# Patient Record
Sex: Male | Born: 1960 | Race: Black or African American | Hispanic: No | Marital: Married | Smoking: Current every day smoker
Health system: Southern US, Community
[De-identification: ages and names within clinical notes are randomized; demographics above are authoritative.]

## PROBLEM LIST (undated history)

## (undated) DIAGNOSIS — F1010 Alcohol abuse, uncomplicated: Secondary | ICD-10-CM

## (undated) DIAGNOSIS — I509 Heart failure, unspecified: Secondary | ICD-10-CM

## (undated) DIAGNOSIS — E781 Pure hyperglyceridemia: Secondary | ICD-10-CM

## (undated) DIAGNOSIS — I429 Cardiomyopathy, unspecified: Secondary | ICD-10-CM

## (undated) DIAGNOSIS — N529 Male erectile dysfunction, unspecified: Secondary | ICD-10-CM

## (undated) DIAGNOSIS — M1990 Unspecified osteoarthritis, unspecified site: Secondary | ICD-10-CM

## (undated) DIAGNOSIS — K635 Polyp of colon: Secondary | ICD-10-CM

## (undated) DIAGNOSIS — I10 Essential (primary) hypertension: Secondary | ICD-10-CM

## (undated) DIAGNOSIS — R933 Abnormal findings on diagnostic imaging of other parts of digestive tract: Secondary | ICD-10-CM

## (undated) HISTORY — PX: COLONOSCOPY: SHX174

---

## 2004-05-19 ENCOUNTER — Emergency Department: Payer: Self-pay | Admitting: Emergency Medicine

## 2004-05-19 ENCOUNTER — Other Ambulatory Visit: Payer: Self-pay

## 2004-05-24 ENCOUNTER — Ambulatory Visit: Payer: Self-pay | Admitting: Internal Medicine

## 2006-03-06 ENCOUNTER — Emergency Department: Payer: Self-pay | Admitting: Emergency Medicine

## 2008-03-15 ENCOUNTER — Emergency Department: Payer: Self-pay | Admitting: Unknown Physician Specialty

## 2008-03-22 ENCOUNTER — Ambulatory Visit: Payer: Self-pay | Admitting: Family Medicine

## 2009-07-13 ENCOUNTER — Ambulatory Visit: Payer: Self-pay | Admitting: Unknown Physician Specialty

## 2013-12-08 DIAGNOSIS — I429 Cardiomyopathy, unspecified: Secondary | ICD-10-CM | POA: Insufficient documentation

## 2013-12-08 DIAGNOSIS — I10 Essential (primary) hypertension: Secondary | ICD-10-CM | POA: Insufficient documentation

## 2013-12-08 DIAGNOSIS — N529 Male erectile dysfunction, unspecified: Secondary | ICD-10-CM | POA: Insufficient documentation

## 2013-12-08 DIAGNOSIS — E781 Pure hyperglyceridemia: Secondary | ICD-10-CM | POA: Insufficient documentation

## 2017-07-24 DIAGNOSIS — M1712 Unilateral primary osteoarthritis, left knee: Secondary | ICD-10-CM | POA: Insufficient documentation

## 2017-10-24 ENCOUNTER — Emergency Department: Payer: Managed Care, Other (non HMO)

## 2017-10-24 ENCOUNTER — Encounter: Payer: Self-pay | Admitting: Emergency Medicine

## 2017-10-24 ENCOUNTER — Other Ambulatory Visit: Payer: Self-pay

## 2017-10-24 ENCOUNTER — Observation Stay
Admission: EM | Admit: 2017-10-24 | Discharge: 2017-10-25 | Disposition: A | Discharge status: Home / Self Care | Payer: Managed Care, Other (non HMO) | Attending: Internal Medicine | Admitting: Internal Medicine

## 2017-10-24 ENCOUNTER — Emergency Department
Admission: EM | Admit: 2017-10-24 | Discharge: 2017-10-24 | Disposition: A | Discharge status: Home / Self Care | Payer: Managed Care, Other (non HMO) | Source: Home / Self Care | Attending: Emergency Medicine | Admitting: Emergency Medicine

## 2017-10-24 DIAGNOSIS — F17200 Nicotine dependence, unspecified, uncomplicated: Secondary | ICD-10-CM

## 2017-10-24 DIAGNOSIS — E785 Hyperlipidemia, unspecified: Secondary | ICD-10-CM | POA: Insufficient documentation

## 2017-10-24 DIAGNOSIS — N179 Acute kidney failure, unspecified: Secondary | ICD-10-CM | POA: Diagnosis not present

## 2017-10-24 DIAGNOSIS — R197 Diarrhea, unspecified: Secondary | ICD-10-CM | POA: Insufficient documentation

## 2017-10-24 DIAGNOSIS — F1020 Alcohol dependence, uncomplicated: Secondary | ICD-10-CM | POA: Insufficient documentation

## 2017-10-24 DIAGNOSIS — K852 Alcohol induced acute pancreatitis without necrosis or infection: Secondary | ICD-10-CM | POA: Insufficient documentation

## 2017-10-24 DIAGNOSIS — Z79899 Other long term (current) drug therapy: Secondary | ICD-10-CM | POA: Insufficient documentation

## 2017-10-24 DIAGNOSIS — F17210 Nicotine dependence, cigarettes, uncomplicated: Secondary | ICD-10-CM | POA: Insufficient documentation

## 2017-10-24 DIAGNOSIS — Z7982 Long term (current) use of aspirin: Secondary | ICD-10-CM | POA: Insufficient documentation

## 2017-10-24 DIAGNOSIS — F1010 Alcohol abuse, uncomplicated: Secondary | ICD-10-CM | POA: Diagnosis not present

## 2017-10-24 DIAGNOSIS — R531 Weakness: Secondary | ICD-10-CM

## 2017-10-24 DIAGNOSIS — I110 Hypertensive heart disease with heart failure: Secondary | ICD-10-CM

## 2017-10-24 DIAGNOSIS — E871 Hypo-osmolality and hyponatremia: Principal | ICD-10-CM | POA: Diagnosis present

## 2017-10-24 DIAGNOSIS — I509 Heart failure, unspecified: Secondary | ICD-10-CM | POA: Insufficient documentation

## 2017-10-24 DIAGNOSIS — R748 Abnormal levels of other serum enzymes: Secondary | ICD-10-CM | POA: Diagnosis not present

## 2017-10-24 DIAGNOSIS — E876 Hypokalemia: Secondary | ICD-10-CM | POA: Diagnosis not present

## 2017-10-24 DIAGNOSIS — E860 Dehydration: Secondary | ICD-10-CM | POA: Insufficient documentation

## 2017-10-24 HISTORY — DX: Essential (primary) hypertension: I10

## 2017-10-24 HISTORY — DX: Heart failure, unspecified: I50.9

## 2017-10-24 HISTORY — DX: Alcohol abuse, uncomplicated: F10.10

## 2017-10-24 LAB — URINALYSIS, COMPLETE (UACMP) WITH MICROSCOPIC
BACTERIA UA: NONE SEEN
BILIRUBIN URINE: NEGATIVE
Glucose, UA: NEGATIVE mg/dL
HGB URINE DIPSTICK: NEGATIVE
Ketones, ur: NEGATIVE mg/dL
LEUKOCYTES UA: NEGATIVE
NITRITE: NEGATIVE
PROTEIN: NEGATIVE mg/dL
Specific Gravity, Urine: 1.008 (ref 1.005–1.030)
Squamous Epithelial / LPF: NONE SEEN (ref 0–5)
pH: 6 (ref 5.0–8.0)

## 2017-10-24 LAB — COMPREHENSIVE METABOLIC PANEL
ALK PHOS: 34 U/L — AB (ref 38–126)
ALK PHOS: 33 U/L — AB (ref 38–126)
ALT: 19 U/L (ref 0–44)
ALT: 18 U/L (ref 0–44)
ANION GAP: 11 (ref 5–15)
ANION GAP: 10 (ref 5–15)
AST: 54 U/L — ABNORMAL HIGH (ref 15–41)
AST: 45 U/L — ABNORMAL HIGH (ref 15–41)
Albumin: 3.7 g/dL (ref 3.5–5.0)
Albumin: 3.7 g/dL (ref 3.5–5.0)
BILIRUBIN TOTAL: 1.2 mg/dL (ref 0.3–1.2)
BUN: 20 mg/dL (ref 6–20)
BUN: 19 mg/dL (ref 6–20)
CALCIUM: 8.7 mg/dL — AB (ref 8.9–10.3)
CALCIUM: 8.4 mg/dL — AB (ref 8.9–10.3)
CHLORIDE: 89 mmol/L — AB (ref 98–111)
CO2: 25 mmol/L (ref 22–32)
CO2: 24 mmol/L (ref 22–32)
CREATININE: 1.6 mg/dL — AB (ref 0.61–1.24)
Chloride: 93 mmol/L — ABNORMAL LOW (ref 98–111)
Creatinine, Ser: 1.8 mg/dL — ABNORMAL HIGH (ref 0.61–1.24)
GFR calc Af Amer: 46 mL/min — ABNORMAL LOW (ref >60)
GFR calc non Af Amer: 40 mL/min — ABNORMAL LOW (ref >60)
GFR calc non Af Amer: 46 mL/min — ABNORMAL LOW (ref >60)
GFR, EST AFRICAN AMERICAN: 53 mL/min — AB (ref >60)
Glucose, Bld: 116 mg/dL — ABNORMAL HIGH (ref 70–99)
Glucose, Bld: 130 mg/dL — ABNORMAL HIGH (ref 70–99)
Potassium: 4.3 mmol/L (ref 3.5–5.1)
Potassium: 3.4 mmol/L — ABNORMAL LOW (ref 3.5–5.1)
SODIUM: 125 mmol/L — AB (ref 135–145)
Sodium: 127 mmol/L — ABNORMAL LOW (ref 135–145)
TOTAL PROTEIN: 6.8 g/dL (ref 6.5–8.1)
Total Bilirubin: 1.4 mg/dL — ABNORMAL HIGH (ref 0.3–1.2)
Total Protein: 6.8 g/dL (ref 6.5–8.1)

## 2017-10-24 LAB — CBC WITH DIFFERENTIAL/PLATELET
BASOS PCT: 3 %
Basophils Absolute: 0.3 10*3/uL — ABNORMAL HIGH (ref 0–0.1)
Eosinophils Absolute: 0.1 10*3/uL (ref 0–0.7)
Eosinophils Relative: 1 %
HCT: 33.9 % — ABNORMAL LOW (ref 40.0–52.0)
HEMOGLOBIN: 12.1 g/dL — AB (ref 13.0–18.0)
LYMPHS PCT: 20 %
Lymphs Abs: 2 10*3/uL (ref 1.0–3.6)
MCH: 35.2 pg — ABNORMAL HIGH (ref 26.0–34.0)
MCHC: 35.8 g/dL (ref 32.0–36.0)
MCV: 98.4 fL (ref 80.0–100.0)
MONO ABS: 0.9 10*3/uL (ref 0.2–1.0)
MONOS PCT: 9 %
NEUTROS ABS: 6.7 10*3/uL — AB (ref 1.4–6.5)
NEUTROS PCT: 67 %
Platelets: 370 10*3/uL (ref 150–440)
RBC: 3.45 MIL/uL — AB (ref 4.40–5.90)
RDW: 13.6 % (ref 11.5–14.5)
WBC: 10 10*3/uL (ref 3.8–10.6)

## 2017-10-24 LAB — CBC
HCT: 32.9 % — ABNORMAL LOW (ref 40.0–52.0)
HEMOGLOBIN: 11.7 g/dL — AB (ref 13.0–18.0)
MCH: 35 pg — ABNORMAL HIGH (ref 26.0–34.0)
MCHC: 35.8 g/dL (ref 32.0–36.0)
MCV: 98 fL (ref 80.0–100.0)
PLATELETS: 337 10*3/uL (ref 150–440)
RBC: 3.35 MIL/uL — AB (ref 4.40–5.90)
RDW: 13.1 % (ref 11.5–14.5)
WBC: 11.3 10*3/uL — ABNORMAL HIGH (ref 3.8–10.6)

## 2017-10-24 LAB — PROTIME-INR
INR: 0.98
Prothrombin Time: 12.9 seconds (ref 11.4–15.2)

## 2017-10-24 LAB — ETHANOL

## 2017-10-24 LAB — MAGNESIUM: Magnesium: 2 mg/dL (ref 1.7–2.4)

## 2017-10-24 LAB — LIPASE, BLOOD: Lipase: 68 U/L — ABNORMAL HIGH (ref 11–51)

## 2017-10-24 MED ORDER — ASPIRIN EC 81 MG PO TBEC: 81.0000 mg | DELAYED_RELEASE_TABLET | Freq: Every day | ORAL | Status: DC

## 2017-10-24 MED ORDER — SODIUM CHLORIDE 0.9 % IV SOLN
Freq: Once | INTRAVENOUS | Status: AC
  Administered 2017-10-24: 19:00:00 via INTRAVENOUS

## 2017-10-24 MED ORDER — ONDANSETRON HCL 4 MG/2ML IJ SOLN: 4.0000 mg | Freq: Four times a day (QID) | INTRAMUSCULAR | Status: DC | PRN

## 2017-10-24 MED ORDER — ACETAMINOPHEN 325 MG PO TABS: 650.0000 mg | ORAL_TABLET | Freq: Four times a day (QID) | ORAL | Status: DC | PRN

## 2017-10-24 MED ORDER — POTASSIUM CHLORIDE CRYS ER 20 MEQ PO TBCR
20.0000 meq | EXTENDED_RELEASE_TABLET | Freq: Two times a day (BID) | ORAL | Status: DC
  Administered 2017-10-24: 20 meq via ORAL
  Filled 2017-10-24: qty 1

## 2017-10-24 MED ORDER — POTASSIUM CHLORIDE IN NACL 20-0.9 MEQ/L-% IV SOLN
INTRAVENOUS | Status: DC
  Administered 2017-10-24 – 2017-10-25 (×2): via INTRAVENOUS
  Filled 2017-10-24 (×4): qty 1000

## 2017-10-24 MED ORDER — FENOFIBRATE 160 MG PO TABS
160.0000 mg | ORAL_TABLET | Freq: Every day | ORAL | Status: DC
  Filled 2017-10-24 (×2): qty 1

## 2017-10-24 MED ORDER — SODIUM CHLORIDE 0.9 % IV SOLN
Freq: Once | INTRAVENOUS | Status: AC
  Administered 2017-10-24: 12:00:00 via INTRAVENOUS

## 2017-10-24 MED ORDER — ONDANSETRON 4 MG PO TBDP: 4.0000 mg | ORAL_TABLET | Freq: Three times a day (TID) | ORAL | 0 refills | Status: DC | PRN

## 2017-10-24 MED ORDER — CHLORDIAZEPOXIDE HCL 25 MG PO CAPS: 25.0000 mg | ORAL_CAPSULE | Freq: Four times a day (QID) | ORAL | 0 refills | Status: DC | PRN

## 2017-10-24 MED ORDER — ACETAMINOPHEN 650 MG RE SUPP: 650.0000 mg | Freq: Four times a day (QID) | RECTAL | Status: DC | PRN

## 2017-10-24 MED ORDER — IOPAMIDOL (ISOVUE-300) INJECTION 61%
30.0000 mL | Freq: Once | INTRAVENOUS | Status: AC | PRN
  Administered 2017-10-24: 30 mL via ORAL

## 2017-10-24 MED ORDER — IOHEXOL 300 MG/ML  SOLN
75.0000 mL | Freq: Once | INTRAMUSCULAR | Status: AC | PRN
  Administered 2017-10-24: 75 mL via INTRAVENOUS

## 2017-10-24 MED ORDER — ONDANSETRON HCL 4 MG PO TABS: 4.0000 mg | ORAL_TABLET | Freq: Four times a day (QID) | ORAL | Status: DC | PRN

## 2017-10-24 MED ORDER — ENOXAPARIN SODIUM 40 MG/0.4ML ~~LOC~~ SOLN: 40.0000 mg | SUBCUTANEOUS | Status: DC

## 2017-10-24 NOTE — H&P (Signed)
[facility] [place] NAME: Alexander Welch    MR#:  [id number]  DATE OF BIRTH:  11-07-1960  DATE OF ADMISSION:  10/24/2017  PRIMARY CARE PHYSICIAN: Sofie Hartigan, MD   REQUESTING/REFERRING PHYSICIAN: Dr. Lenise Arena  CHIEF COMPLAINT:   Chief Complaint  Patient presents with  . Weakness    HISTORY OF PRESENT ILLNESS:  Alexander Welch  is a 57 y.o. male with a known history of alcohol abuse, hypertension who presents to the hospital due to weakness, loose stools and poor p.o. intake.  Patient says she has a history of significant alcohol abuse and drinks about a pint of vodka daily.  He presented to the ER earlier today complaining of generalized weakness malaise and was noted to have mild hyponatremia and a mild chemical pancreatitis but no imaging findings suggestive of pancreatitis.  He was given some IV fluids, and discharged home on some antiemetics.  When he got home he felt more weak and started having some loose stools and therefore his wife brought him back to the ER.  On repeat blood work patient continues to be hyponatremic and also now hypokalemic.  Hospitalist services were contacted for admission.  Patient denies any abdominal pain, fever, chills, headache, dysuria, urinary frequency, melena, hematochezia or any other associated symptoms.  PAST MEDICAL HISTORY:   Past Medical History:  Diagnosis Date  . Alcohol abuse   . CHF (congestive heart failure) ([place])   . Hypertension     PAST SURGICAL HISTORY:  History reviewed. No pertinent surgical history.  SOCIAL HISTORY:   Social History   Tobacco Use  . Smoking status: Current Every Day Smoker    Packs/day: 1.00    Years: 40.00    Pack years: 40.00    Types: Cigarettes  . Smokeless tobacco: Never Used  Substance Use Topics  . Alcohol use: Yes    Comment: 1 pint of vodka daily    FAMILY HISTORY:   Family History  Problem Relation Age of Onset  . Heart  failure Mother   . Renal Disease Mother   . Bone cancer Father     DRUG ALLERGIES:  No Known Allergies  REVIEW OF SYSTEMS:   Review of Systems  Constitutional: Negative for fever and weight loss.  HENT: Negative for congestion, nosebleeds and tinnitus.   Eyes: Negative for blurred vision, double vision and redness.  Respiratory: Negative for cough, hemoptysis and shortness of breath.   Cardiovascular: Negative for chest pain, orthopnea, leg swelling and PND.  Gastrointestinal: Positive for diarrhea and nausea. Negative for abdominal pain, melena and vomiting.  Genitourinary: Negative for dysuria, hematuria and urgency.  Musculoskeletal: Negative for falls and joint pain.  Neurological: Positive for weakness. Negative for dizziness, tingling, sensory change, focal weakness, seizures and headaches.  Endo/Heme/Allergies: Negative for polydipsia. Does not bruise/bleed easily.  Psychiatric/Behavioral: Negative for depression and memory loss. The patient is not nervous/anxious.     MEDICATIONS AT HOME:   Prior to Admission medications   Medication Sig Start Date End Date Taking? Authorizing Provider  aspirin EC 81 MG tablet Take 1 tablet by mouth daily.   Yes [provider]  fenofibrate 160 MG tablet Take 1 tablet by mouth daily. 01/09/17  Yes [provider]  furosemide (LASIX) 40 MG tablet Take 1 tablet by mouth daily. 01/09/17  Yes [provider]  lisinopril-hydrochlorothiazide (PRINZIDE,ZESTORETIC) 20-12.5 MG tablet Take 1 tablet by mouth daily. 01/09/17  Yes [provider]  sildenafil (  REVATIO) 20 MG tablet Take 40-60 mg by mouth daily as needed. 01/09/17 01/09/18 Yes [provider]  chlordiazePOXIDE (LIBRIUM) 25 MG capsule Take 1 capsule (25 mg total) by mouth 4 (four) times daily as needed. Patient not taking: Reported on 10/24/2017 10/24/17   Earleen Newport, MD  ondansetron (ZOFRAN ODT) 4 MG disintegrating tablet Take 1 tablet (4 mg  total) by mouth every 8 (eight) hours as needed for nausea or vomiting. 10/24/17   Earleen Newport, MD      VITAL SIGNS:  Blood pressure 111/75, pulse 90, temperature 99.7 F (37.6 C), temperature source Oral, resp. rate 18, height 5\' 7"  (1.702 m), weight 68 kg (150 lb), SpO2 100 %.  PHYSICAL EXAMINATION:  Physical Exam  GENERAL:  57 y.o.-year-old patient lying in the bed in no acute distress.  EYES: Pupils equal, round, reactive to light and accommodation. No scleral icterus. Extraocular muscles intact.  HEENT: Head atraumatic, normocephalic. Oropharynx and nasopharynx clear. No oropharyngeal erythema, moist oral mucosa  NECK:  Supple, no jugular venous distention. No thyroid enlargement, no tenderness.  LUNGS: Normal breath sounds bilaterally, no wheezing, rales, rhonchi. No use of accessory muscles of respiration.  CARDIOVASCULAR: S1, S2 RRR. No murmurs, rubs, gallops, clicks.  ABDOMEN: Soft, nontender, nondistended. Bowel sounds present. No organomegaly or mass.  EXTREMITIES: No pedal edema, cyanosis, or clubbing. + 2 pedal & radial pulses b/l.   NEUROLOGIC: Cranial nerves II through XII are intact. No focal Motor or sensory deficits appreciated b/l PSYCHIATRIC: The patient is alert and oriented x 3.  SKIN: No obvious rash, lesion, or ulcer.   LABORATORY PANEL:   CBC Recent Labs  Lab 10/24/17 1741  WBC 11.3*  HGB 11.7*  HCT 32.9*  PLT 337   ------------------------------------------------------------------------------------------------------------------  Chemistries  Recent Labs  Lab 10/24/17 1741  NA 127*  K 3.4*  CL 93*  CO2 24  GLUCOSE 130*  BUN 19  CREATININE 1.60*  CALCIUM 8.4*  AST 45*  ALT 18  ALKPHOS 33*  BILITOT 1.2   ------------------------------------------------------------------------------------------------------------------  Cardiac Enzymes No results for input(s): TROPONINI in the last 168  hours. ------------------------------------------------------------------------------------------------------------------  RADIOLOGY:  Dg Chest 2 View  Result Date: 10/24/2017 CLINICAL DATA:  Smoker.  Early satiety.  Weakness. EXAM: CHEST - 2 VIEW COMPARISON:  03/06/2006. FINDINGS: Normal sized heart. Clear lungs. Small nodular density overlying scratch the small nodular density at the left lung base. Mild diffuse peribronchial thickening. Mild thoracic spine degenerative changes. IMPRESSION: 1. **An incidental finding of potential clinical significance has been found. There is a small nodular density at the left lung base on the frontal view. Further evaluation with an elective chest CT with contrast is recommended.** 2. Mild chronic bronchitic changes. Electronically Signed   By: Claudie Revering M.D.   On: 10/24/2017 13:04   Ct Abdomen Pelvis W Contrast  Result Date: 10/24/2017 CLINICAL DATA:  57 year old male with decreased p.o. intake for the past 2 weeks due to early satiety. Subsequent unintentional weight loss. Fatigue. Diarrhea. Abnormal lipase. EXAM: CT ABDOMEN AND PELVIS WITH CONTRAST TECHNIQUE: Multidetector CT imaging of the abdomen and pelvis was performed using the standard protocol following bolus administration of intravenous contrast. CONTRAST:  66mL OMNIPAQUE IOHEXOL 300 MG/ML  SOLN COMPARISON:  Chest radiographs today reported separately. FINDINGS: Lower chest: Normal lung bases.  No pericardial or pleural effusion. Hepatobiliary: Negative liver and gallbladder. Pancreas: Normal pancreatic enhancement. No peripancreatic inflammation. Spleen: Negative. Adrenals/Urinary Tract: Normal adrenal glands. Bilateral renal enhancement and contrast excretion is symmetric  and normal. Nonspecific perinephric stranding is symmetric. Negative course of both ureters. The urinary bladder is distended with an estimated bladder volume of 436 milliliters, but otherwise unremarkable. Stomach/Bowel: There is  questionable mild wall thickening throughout the rectosigmoid colon. There is mild superimposed sigmoid diverticulosis. There is no convincing mesenteric inflammation in these regions. The descending colon has a more normal appearance, and oral contrast has reached the splenic flexure. Negative transverse colon and right colon. Normal appendix (series 2, image 41). Negative terminal ileum. No dilated or inflamed small bowel. The stomach is within normal limits. Contraction of the proximal duodenum is felt to explain the appearance of mild wall thickening there, no surrounding inflammation. No abdominal free air, free fluid. Vascular/Lymphatic: Aortoiliac calcified atherosclerosis. The major arterial structures in the abdomen and pelvis are patent, including the SMA. There is some proximal SMA atherosclerosis noted. The portal venous system is patent. No lymphadenopathy. Reproductive: Negative. Other: No pelvic free fluid. Musculoskeletal: No acute osseous abnormality identified. Lower lumbar disc and endplate degeneration. IMPRESSION: 1. Mild nonspecific wall thickening in the sigmoid colon and rectum, although the more proximal large bowel appears normal. Mild distal colitis is difficult to exclude. 2. Mild-to-moderate nonspecific bilateral pararenal stranding. No renal obstruction, and renal enhancement remains normal. Consider acute intrinsic renal disease or renal insufficiency. 3. No other acute or inflammatory process. Normal pancreas and appendix. 4.  Aortic Atherosclerosis (ICD10-I70.0). Electronically Signed   By: Genevie Ann M.D.   On: 10/24/2017 13:11     IMPRESSION AND PLAN:   57 year old male with past medical history of hypertension, alcohol abuse who presents to the hospital due to weakness, diarrhea.  1.  Hyponatremia/Hypokalemia-secondary to alcohol abuse, diarrhea and poor p.o. intake. - We will hydrate the patient with IV fluids, hold patient's lisinopril/HCTZ and diuretics. -Follow  sodium.  2.  Generalized weakness- secondary to poor p.o. intake, dehydration and also hyponatremia. - Patient has already clinically improved with some IV fluids in the ER and will follow clinically.  3.  Essential hypertension- patient is hemodynamically stable, given his acute kidney injury and dehydration we will hold his lisinopril/HCTZ.  4.  Acute kidney injury-secondary to alcohol abuse and dehydration.  We will hydrate the patient with IV fluids, follow BUN and creatinine urine output.  Hold patient's Lasix, lisinopril/HCTZ.  5.  Hyperlipidemia-continue fenofibrate.  6.  Alcohol abuse- no risk for alcohol withdrawal presently.  If patient stays past 48 hours would consider starting him on CIWA protocol.    All the records are reviewed and case discussed with ED provider. Management plans discussed with the patient, family and they are in agreement.  CODE STATUS: Full Code  TOTAL TIME TAKING CARE OF THIS PATIENT: 40 minutes.    Henreitta Leber M.D on 10/24/2017 at 8:58 PM  Between 7am to 6pm - Pager - [phone]  After 6pm go to [email or web address] - password [email or web address] [facility]  [facility]  Office  [phone]  CC: Primary care physician; Sofie Hartigan, MD

## 2017-10-24 NOTE — ED Triage Notes (Addendum)
Pt states for last 2 weeks he has had decreased PO intake due to abdominal fullness after eating. Reports weight loss, fatigue. Diarrhea after eating. Pt alert and oriented X4, active, cooperative, pt in NAD. RR even and unlabored, color WNL.    Pt reports that he drinks beer, daily. Last drink was last night.

## 2017-10-24 NOTE — ED Provider Notes (Signed)
[facility] Emergency Department Provider Note       Time seen: ----------------------------------------- 11:20 AM on 10/24/2017 -----------------------------------------   I have reviewed the triage vital signs and the nursing notes.  HISTORY   Chief Complaint Anorexia    HPI Alexander Welch is a 56 y.o. male with a history of CHF and hypertension who presents to the ED for poor p.o. intake for the past 2 weeks and abdominal fullness after eating.  He reports weight loss with fatigue and diarrhea sometimes after he eats.  He also reports that he drinks beer daily, his last drink was last night.  He denies fevers, chills or other complaints.  Past Medical History:  Diagnosis Date  . CHF (congestive heart failure) ([place])   . Hypertension     There are no active problems to display for this patient.   History reviewed. No pertinent surgical history.  Allergies Patient has no known allergies.  Social History Social History   Tobacco Use  . Smoking status: Current Every Day Smoker  Substance Use Topics  . Alcohol use: Yes  . Drug use: Not on file   Review of Systems Constitutional: Negative for fever.  Positive for poor appetite Cardiovascular: Negative for chest pain. Respiratory: Negative for shortness of breath. Gastrointestinal: Positive for abdominal fullness, diarrhea Genitourinary: Negative for dysuria. Musculoskeletal: Negative for back pain. Skin: Negative for rash. Neurological: Negative for headaches, focal weakness or numbness.  All systems negative/normal/unremarkable except as stated in the HPI  ____________________________________________   PHYSICAL EXAM:  VITAL SIGNS: ED Triage Vitals  Enc Vitals Group     BP 10/24/17 1115 103/64     Pulse Rate 10/24/17 1115 (!) 102     Resp 10/24/17 1115 16     Temp 10/24/17 1115 98.6 F (37 C)     Temp Source 10/24/17 1115 Oral     SpO2 10/24/17 1115 100 %     Weight 10/24/17  1116 150 lb (68 kg)     Height 10/24/17 1116 5\' 7"  (1.702 m)     Head Circumference --      Peak Flow --      Pain Score 10/24/17 1115 0     Pain Loc --      Pain Edu? --      Excl. in [place]? --    Constitutional: Alert and oriented.  Anxious and tearful Eyes: Conjunctivae are normal. Normal extraocular movements. ENT   Head: Normocephalic and atraumatic.   Nose: No congestion/rhinnorhea.   Mouth/Throat: Mucous membranes are moist.   Neck: No stridor. Cardiovascular: Normal rate, regular rhythm. No murmurs, rubs, or gallops. Respiratory: Normal respiratory effort without tachypnea nor retractions. Breath sounds are clear and equal bilaterally. No wheezes/rales/rhonchi. Gastrointestinal: Diffuse, nonfocal epigastric tenderness.  No palpable mass Musculoskeletal: Nontender with normal range of motion in extremities. No lower extremity tenderness nor edema. Neurologic:  Normal speech and language. No gross focal neurologic deficits are appreciated.  Skin:  Skin is warm, dry and intact. No rash noted. Psychiatric: Mood and affect are normal. Speech and behavior are normal.  ____________________________________________  EKG: Interpreted by me.  Sinus rhythm the rate of 95 bpm, PVCs, LVH with repolarization abnormality, normal QT  ____________________________________________  ED COURSE:  As part of my medical decision making, I reviewed the following data within the [place] History obtained from family if available, nursing notes, old chart and ekg, as well as notes from prior ED visits. Patient presented for abdominal  pain, weight loss, early satiety and occasional diarrhea, we will assess with labs and imaging as indicated at this time.   Procedures ____________________________________________   LABS (pertinent positives/negatives)  Labs Reviewed  CBC WITH DIFFERENTIAL/PLATELET - Abnormal; Notable for the following components:      Result Value   RBC  3.45 (*)    Hemoglobin 12.1 (*)    HCT 33.9 (*)    MCH 35.2 (*)    Neutro Abs 6.7 (*)    Basophils Absolute 0.3 (*)    All other components within normal limits  COMPREHENSIVE METABOLIC PANEL - Abnormal; Notable for the following components:   Sodium 125 (*)    Chloride 89 (*)    Glucose, Bld 116 (*)    Creatinine, Ser 1.80 (*)    Calcium 8.7 (*)    AST 54 (*)    Alkaline Phosphatase 34 (*)    Total Bilirubin 1.4 (*)    GFR calc non Af Amer 40 (*)    GFR calc Af Amer 46 (*)    All other components within normal limits  LIPASE, BLOOD - Abnormal; Notable for the following components:   Lipase 68 (*)    All other components within normal limits  URINALYSIS, COMPLETE (UACMP) WITH MICROSCOPIC - Abnormal; Notable for the following components:   Color, Urine YELLOW (*)    APPearance CLEAR (*)    All other components within normal limits  ETHANOL  PROTIME-INR    RADIOLOGY Images were viewed by me  CT the abdomen pelvis with contrast IMPRESSION: 1. Mild nonspecific wall thickening in the sigmoid colon and rectum, although the more proximal large bowel appears normal. Mild distal colitis is difficult to exclude. 2. Mild-to-moderate nonspecific bilateral pararenal stranding. No renal obstruction, and renal enhancement remains normal. Consider acute intrinsic renal disease or renal insufficiency. 3. No other acute or inflammatory process. Normal pancreas and appendix. 4.  Aortic Atherosclerosis (ICD10-I70.0). Chest x-ray IMPRESSION: 1. **An incidental finding of potential clinical significance has been found. There is a small nodular density at the left lung base on the frontal view. Further evaluation with an elective chest CT with contrast is recommended.** 2. Mild chronic bronchitic changes. ____________________________________________  DIFFERENTIAL DIAGNOSIS   Weakness, dehydration, cirrhosis, occult cancer, alcohol use disorder  FINAL ASSESSMENT AND  PLAN  Weakness, hyponatremia, alcohol use disorder, mild pancreatitis   Plan: The patient had presented for early satiety with abdominal pain and weakness. Patient's labs revealed hyponatremia and mild pancreatitis with a mildly elevated AST. Patient's imaging did not reveal any acute process, he has likely chronic abnormalities and needs outpatient colonoscopy.  He also has a small lung nodule of uncertain significance.  I did discuss with gastroenterology on-call who will see him closely as an outpatient.   Laurence Aly, MD   Note: This note was generated in part or whole with voice recognition software. Voice recognition is usually quite accurate but there are transcription errors that can and very often do occur. I apologize for any typographical errors that were not detected and corrected.     Earleen Newport, MD 10/24/17 1355

## 2017-10-24 NOTE — ED Notes (Signed)
Admitting MD at bedside.

## 2017-10-24 NOTE — ED Provider Notes (Signed)
[facility] Emergency Department Provider Note       Time seen: ----------------------------------------- 7:48 PM on 10/24/2017 -----------------------------------------   I have reviewed the triage vital signs and the nursing notes.  HISTORY   Chief Complaint Weakness    HPI Alexander Welch is a 57 y.o. male with a history of CHF and hypertension who presents to the ED for increasing weakness in the hour since being seen by me prior earlier in the day.  Patient states he was evaluated earlier today and has not gotten his prescriptions filled yet.  He did have a large bowel movement with red material in it which she was concerned was blood.  He has been weak and dizzy and has had difficulty walking.  He has not had an episode like this before.  Patient has a chronic history of alcoholism, was seen earlier in the day for hyponatremia  Past Medical History:  Diagnosis Date  . CHF (congestive heart failure) ([place])   . Hypertension     There are no active problems to display for this patient.   History reviewed. No pertinent surgical history.  Allergies Patient has no known allergies.  Social History Social History   Tobacco Use  . Smoking status: Current Every Day Smoker  . Smokeless tobacco: Never Used  Substance Use Topics  . Alcohol use: Yes  . Drug use: Not on file   Review of Systems Constitutional: Negative for fever. Cardiovascular: Negative for chest pain. Respiratory: Negative for shortness of breath. Gastrointestinal: Negative for abdominal pain, positive for diarrhea, possible rectal bleeding Musculoskeletal: Negative for back pain. Skin: Negative for rash. Neurological: Positive for weakness and dizziness  All systems negative/normal/unremarkable except as stated in the HPI  ____________________________________________   PHYSICAL EXAM:  VITAL SIGNS: ED Triage Vitals  Enc Vitals Group     BP 10/24/17 1738 110/75      Pulse Rate 10/24/17 1738 96     Resp 10/24/17 1738 18     Temp 10/24/17 1738 99.7 F (37.6 C)     Temp Source 10/24/17 1738 Oral     SpO2 10/24/17 1738 98 %     Weight 10/24/17 1738 150 lb (68 kg)     Height 10/24/17 1738 5\' 7"  (1.702 m)     Head Circumference --      Peak Flow --      Pain Score 10/24/17 1739 0     Pain Loc --      Pain Edu? --      Excl. in [place]? --    Constitutional: Alert and oriented.  No distress Eyes: Conjunctivae are normal. Normal extraocular movements. Cardiovascular: Normal rate, regular rhythm. No murmurs, rubs, or gallops. Respiratory: Normal respiratory effort without tachypnea nor retractions. Breath sounds are clear and equal bilaterally. No wheezes/rales/rhonchi. Gastrointestinal: Soft and nontender. Normal bowel sounds Rectal: Heme-negative, no hemorrhoids or obvious stool Musculoskeletal: Nontender with normal range of motion in extremities. No lower extremity tenderness nor edema. Neurologic:  Normal speech and language. No gross focal neurologic deficits are appreciated.  Skin:  Skin is warm, dry and intact. No rash noted. Psychiatric: Mood and affect are normal. Speech and behavior are normal.  ____________________________________________  ED COURSE:  As part of my medical decision making, I reviewed the following data within the [place] History obtained from family if available, nursing notes, old chart and ekg, as well as notes from prior ED visits. Patient presented for weakness and dizziness after being  seen recently for same, we will assess with labs as indicated at this time.   Procedures ____________________________________________   LABS (pertinent positives/negatives)  Labs Reviewed  COMPREHENSIVE METABOLIC PANEL - Abnormal; Notable for the following components:      Result Value   Sodium 127 (*)    Potassium 3.4 (*)    Chloride 93 (*)    Glucose, Bld 130 (*)    Creatinine, Ser 1.60 (*)    Calcium 8.4 (*)     AST 45 (*)    Alkaline Phosphatase 33 (*)    GFR calc non Af Amer 46 (*)    GFR calc Af Amer 53 (*)    All other components within normal limits  CBC - Abnormal; Notable for the following components:   WBC 11.3 (*)    RBC 3.35 (*)    Hemoglobin 11.7 (*)    HCT 32.9 (*)    MCH 35.0 (*)    All other components within normal limits  C DIFFICILE QUICK SCREEN W PCR REFLEX  GASTROINTESTINAL PANEL BY PCR, STOOL (REPLACES STOOL CULTURE)   ____________________________________________  DIFFERENTIAL DIAGNOSIS   Dehydration, electrolyte ab normality, bright red blood per rectum, anemia, hyponatremia, alcohol withdrawal  FINAL ASSESSMENT AND PLAN  Hyponatremia, diarrhea, dizziness   Plan: The patient had presented for weakness and dizziness with possible rectal bleeding after recently being seen for similar. Patient's labs did reveal hyponatremia although slightly improved from prior.  He still feels weak and dizzy and will need additional IV fluids.  We will also send a stool culture if he can obtain one.  He was Hemoccult negative here but this may need to be rechecked.  He also may need medication to prevent alcohol withdrawal.   Laurence Aly, MD   Note: This note was generated in part or whole with voice recognition software. Voice recognition is usually quite accurate but there are transcription errors that can and very often do occur. I apologize for any typographical errors that were not detected and corrected.     Earleen Newport, MD 10/24/17 [phone]

## 2017-10-24 NOTE — ED Triage Notes (Signed)
Patient presents to the ED with increasing weakness in the hours since being seen in the ED.  Patient was evaluated and discharged earlier today and had not yet gotten his prescriptions filled.  Patient reports having a bowel movement with red blood in it.  Patient states he had not had an episode like this before he was discharged from the ED.  Patient states he has had blood in his stool previously, "a long time ago."

## 2017-10-24 NOTE — ED Notes (Signed)
Drink given to patient.

## 2017-10-24 NOTE — Progress Notes (Signed)
Patient arrived to 1A Room 146. Patient denies pain and all questions answered. Patient oriented to unit and use of call bell/room phone. Skin assessment completed with Neoma Laming RN and skin intact. A&Ox4 and VSS. Nursing staff will continue to monitor for any changes in patient status. Earleen Reaper, RN

## 2017-10-25 LAB — COMPREHENSIVE METABOLIC PANEL
ALT: 16 U/L (ref 0–44)
AST: 37 U/L (ref 15–41)
Albumin: 3.2 g/dL — ABNORMAL LOW (ref 3.5–5.0)
Alkaline Phosphatase: 29 U/L — ABNORMAL LOW (ref 38–126)
Anion gap: 6 (ref 5–15)
BUN: 12 mg/dL (ref 6–20)
CHLORIDE: 105 mmol/L (ref 98–111)
CO2: 25 mmol/L (ref 22–32)
CREATININE: 1.06 mg/dL (ref 0.61–1.24)
Calcium: 7.9 mg/dL — ABNORMAL LOW (ref 8.9–10.3)
GFR calc Af Amer: >60 mL/min (ref >60)
Glucose, Bld: 101 mg/dL — ABNORMAL HIGH (ref 70–99)
Potassium: 3.6 mmol/L (ref 3.5–5.1)
SODIUM: 136 mmol/L (ref 135–145)
Total Bilirubin: 1.1 mg/dL (ref 0.3–1.2)
Total Protein: 5.8 g/dL — ABNORMAL LOW (ref 6.5–8.1)

## 2017-10-25 MED ORDER — NICOTINE 21 MG/24HR TD PT24: 21.0000 mg | MEDICATED_PATCH | Freq: Every day | TRANSDERMAL | 0 refills | Status: DC

## 2017-10-25 NOTE — Discharge Summary (Addendum)
[facility] NAME: Alexander Welch    MR#:  [id number]  DATE OF BIRTH:  1960-05-26  DATE OF ADMISSION:  10/24/2017 ADMITTING PHYSICIAN: Henreitta Leber, MD  DATE OF DISCHARGE:   PRIMARY CARE PHYSICIAN: Sofie Hartigan, MD    ADMISSION DIAGNOSIS:  Hyponatremia [E87.1] Weakness [R53.1] Diarrhea, unspecified type [R19.7]  DISCHARGE DIAGNOSIS:  Active Problems:   Hyponatremia   SECONDARY DIAGNOSIS:   Past Medical History:  Diagnosis Date  . Alcohol abuse   . CHF (congestive heart failure) ([place])   . Hypertension     HOSPITAL COURSE:  57 year old male with a history of EtOH, tobacco dependence and essential hypertension who is brought into the hospital due to weakness and diarrhea.  1.  Hyponatremia: This is the etiology of patient's generalized weakness.  Sodium level has improved and normalized with IV fluids.  2.  Acute kidney injury in the setting of EtOH abuse along with diarrhea which is improved with IV fluids.  3.  Diarrhea: This is resolved.  4.  Essential hypertension: Patient may resume outpatient regimen since creatinine and sodium level have improved.  5.  EtOH abuse: Patient counseled to avoid alcohol.  6.Tobacco dependence: Patient is encouraged to quit smoking. Counseling was provided for 4 minutes.  7. hyperlipidemia: Patient will continue fenofibrate.  DISCHARGE CONDITIONS AND DIET:   Able for discharge on cardiac diet  CONSULTS OBTAINED:    DRUG ALLERGIES:  No Known Allergies  DISCHARGE MEDICATIONS:   Allergies as of 10/25/2017   No Known Allergies     Medication List    STOP taking these medications   chlordiazePOXIDE 25 MG capsule Commonly known as:  LIBRIUM     TAKE these medications   aspirin EC 81 MG tablet Take 1 tablet by mouth daily.   fenofibrate 160 MG tablet Take 1 tablet by mouth daily.   furosemide 40 MG tablet Commonly known as:  LASIX Take 1 tablet by mouth  daily.   lisinopril-hydrochlorothiazide 20-12.5 MG tablet Commonly known as:  PRINZIDE,ZESTORETIC Take 1 tablet by mouth daily.   nicotine 21 mg/24hr patch Commonly known as:  NICODERM CQ Place 1 patch (21 mg total) onto the skin daily.   ondansetron 4 MG disintegrating tablet Commonly known as:  ZOFRAN ODT Take 1 tablet (4 mg total) by mouth every 8 (eight) hours as needed for nausea or vomiting.   sildenafil 20 MG tablet Commonly known as:  REVATIO Take 40-60 mg by mouth daily as needed.         Today   CHIEF COMPLAINT:   Patient doing well this morning without abdominal pain or diarrhea   VITAL SIGNS:  Blood pressure 139/85, pulse 64, temperature 98.4 F (36.9 C), temperature source Oral, resp. rate 17, height 5\' 7"  (1.702 m), weight 68 kg (150 lb), SpO2 100 %.   REVIEW OF SYSTEMS:  Review of Systems  Constitutional: Negative.  Negative for chills, fever and malaise/fatigue.  HENT: Negative.  Negative for ear discharge, ear pain, hearing loss, nosebleeds and sore throat.   Eyes: Negative.  Negative for blurred vision and pain.  Respiratory: Negative.  Negative for cough, hemoptysis, shortness of breath and wheezing.   Cardiovascular: Negative.  Negative for chest pain, palpitations and leg swelling.  Gastrointestinal: Negative.  Negative for abdominal pain, blood in stool, diarrhea, nausea and vomiting.  Genitourinary: Negative.  Negative for dysuria.  Musculoskeletal: Negative.  Negative for back pain.  Skin: Negative.   Neurological:  Negative for dizziness, tremors, speech change, focal weakness, seizures and headaches.  Endo/Heme/Allergies: Negative.  Does not bruise/bleed easily.  Psychiatric/Behavioral: Negative.  Negative for depression, hallucinations and suicidal ideas.     PHYSICAL EXAMINATION:  GENERAL:  57 y.o.-year-old patient lying in the bed with no acute distress.  NECK:  Supple, no jugular venous distention. No thyroid enlargement, no  tenderness.  LUNGS: Normal breath sounds bilaterally, no wheezing, rales,rhonchi  No use of accessory muscles of respiration.  CARDIOVASCULAR: S1, S2 normal. No murmurs, rubs, or gallops.  ABDOMEN: Soft, non-tender, non-distended. Bowel sounds present. No organomegaly or mass.  EXTREMITIES: No pedal edema, cyanosis, or clubbing.  PSYCHIATRIC: The patient is alert and oriented x 3.  SKIN: No obvious rash, lesion, or ulcer.   DATA REVIEW:   CBC Recent Labs  Lab 10/24/17 1741  WBC 11.3*  HGB 11.7*  HCT 32.9*  PLT 337    Chemistries  Recent Labs  Lab 10/24/17 1741 10/25/17 0433  NA 127* 136  K 3.4* 3.6  CL 93* 105  CO2 24 25  GLUCOSE 130* 101*  BUN 19 12  CREATININE 1.60* 1.06  CALCIUM 8.4* 7.9*  MG 2.0  --   AST 45* 37  ALT 18 16  ALKPHOS 33* 29*  BILITOT 1.2 1.1    Cardiac Enzymes No results for input(s): TROPONINI in the last 168 hours.  Microbiology Results  @MICRORSLT48 @  RADIOLOGY:  Dg Chest 2 View  Result Date: 10/24/2017 CLINICAL DATA:  Smoker.  Early satiety.  Weakness. EXAM: CHEST - 2 VIEW COMPARISON:  03/06/2006. FINDINGS: Normal sized heart. Clear lungs. Small nodular density overlying scratch the small nodular density at the left lung base. Mild diffuse peribronchial thickening. Mild thoracic spine degenerative changes. IMPRESSION: 1. **An incidental finding of potential clinical significance has been found. There is a small nodular density at the left lung base on the frontal view. Further evaluation with an elective chest CT with contrast is recommended.** 2. Mild chronic bronchitic changes. Electronically Signed   By: Claudie Revering M.D.   On: 10/24/2017 13:04   Ct Abdomen Pelvis W Contrast  Result Date: 10/24/2017 CLINICAL DATA:  57 year old male with decreased p.o. intake for the past 2 weeks due to early satiety. Subsequent unintentional weight loss. Fatigue. Diarrhea. Abnormal lipase. EXAM: CT ABDOMEN AND PELVIS WITH CONTRAST TECHNIQUE: Multidetector  CT imaging of the abdomen and pelvis was performed using the standard protocol following bolus administration of intravenous contrast. CONTRAST:  80mL OMNIPAQUE IOHEXOL 300 MG/ML  SOLN COMPARISON:  Chest radiographs today reported separately. FINDINGS: Lower chest: Normal lung bases.  No pericardial or pleural effusion. Hepatobiliary: Negative liver and gallbladder. Pancreas: Normal pancreatic enhancement. No peripancreatic inflammation. Spleen: Negative. Adrenals/Urinary Tract: Normal adrenal glands. Bilateral renal enhancement and contrast excretion is symmetric and normal. Nonspecific perinephric stranding is symmetric. Negative course of both ureters. The urinary bladder is distended with an estimated bladder volume of 436 milliliters, but otherwise unremarkable. Stomach/Bowel: There is questionable mild wall thickening throughout the rectosigmoid colon. There is mild superimposed sigmoid diverticulosis. There is no convincing mesenteric inflammation in these regions. The descending colon has a more normal appearance, and oral contrast has reached the splenic flexure. Negative transverse colon and right colon. Normal appendix (series 2, image 41). Negative terminal ileum. No dilated or inflamed small bowel. The stomach is within normal limits. Contraction of the proximal duodenum is felt to explain the appearance of mild wall thickening there, no surrounding inflammation. No abdominal free air, free fluid. Vascular/Lymphatic: Aortoiliac  calcified atherosclerosis. The major arterial structures in the abdomen and pelvis are patent, including the SMA. There is some proximal SMA atherosclerosis noted. The portal venous system is patent. No lymphadenopathy. Reproductive: Negative. Other: No pelvic free fluid. Musculoskeletal: No acute osseous abnormality identified. Lower lumbar disc and endplate degeneration. IMPRESSION: 1. Mild nonspecific wall thickening in the sigmoid colon and rectum, although the more proximal  large bowel appears normal. Mild distal colitis is difficult to exclude. 2. Mild-to-moderate nonspecific bilateral pararenal stranding. No renal obstruction, and renal enhancement remains normal. Consider acute intrinsic renal disease or renal insufficiency. 3. No other acute or inflammatory process. Normal pancreas and appendix. 4.  Aortic Atherosclerosis (ICD10-I70.0). Electronically Signed   By: Genevie Ann M.D.   On: 10/24/2017 13:11      Allergies as of 10/25/2017   No Known Allergies     Medication List    STOP taking these medications   chlordiazePOXIDE 25 MG capsule Commonly known as:  LIBRIUM     TAKE these medications   aspirin EC 81 MG tablet Take 1 tablet by mouth daily.   fenofibrate 160 MG tablet Take 1 tablet by mouth daily.   furosemide 40 MG tablet Commonly known as:  LASIX Take 1 tablet by mouth daily.   lisinopril-hydrochlorothiazide 20-12.5 MG tablet Commonly known as:  PRINZIDE,ZESTORETIC Take 1 tablet by mouth daily.   nicotine 21 mg/24hr patch Commonly known as:  NICODERM CQ Place 1 patch (21 mg total) onto the skin daily.   ondansetron 4 MG disintegrating tablet Commonly known as:  ZOFRAN ODT Take 1 tablet (4 mg total) by mouth every 8 (eight) hours as needed for nausea or vomiting.   sildenafil 20 MG tablet Commonly known as:  REVATIO Take 40-60 mg by mouth daily as needed.           Management plans discussed with the patient and he is in agreement. Stable for discharge home  Patient should follow up with pcp  CODE STATUS:     Code Status Orders  (From admission, onward)        Start     Ordered   10/24/17 2138  Full code  Continuous     10/24/17 2138    Code Status History    This patient has a current code status but no historical code status.      TOTAL TIME TAKING CARE OF THIS PATIENT: 37 minutes.    Note: This dictation was prepared with Dragon dictation along with smaller phrase technology. Any transcriptional errors  that result from this process are unintentional.  Andriy Sherk M.D on 10/25/2017 at 9:07 AM  Between 7am to 6pm - Pager - [phone] After 6pm go to [email or web address] - password [email or web address]  Office  [phone]  CC: Primary care physician; Sofie Hartigan, MD

## 2017-10-25 NOTE — Plan of Care (Signed)
°  Problem: Education: °Goal: Knowledge of General Education information will improve °Description: Including pain rating scale, medication(s)/side effects and non-pharmacologic comfort measures °Outcome: Progressing °  °Problem: Health Behavior/Discharge Planning: °Goal: Ability to manage health-related needs will improve °Outcome: Progressing °  °Problem: Clinical Measurements: °Goal: Ability to maintain clinical measurements within normal limits will improve °Outcome: Progressing °Goal: Will remain free from infection °Outcome: Progressing °Goal: Diagnostic test results will improve °Outcome: Progressing °  °Problem: Activity: °Goal: Risk for activity intolerance will decrease °Outcome: Progressing °  °Problem: Nutrition: °Goal: Adequate nutrition will be maintained °Outcome: Progressing °  °Problem: Coping: °Goal: Level of anxiety will decrease °Outcome: Progressing °  °Problem: Pain Managment: °Goal: General experience of comfort will improve °Outcome: Progressing °  °Problem: Safety: °Goal: Ability to remain free from injury will improve °Outcome: Progressing °  °Problem: Skin Integrity: °Goal: Risk for impaired skin integrity will decrease °Outcome: Progressing °  °

## 2017-10-25 NOTE — Progress Notes (Signed)
Patient has been discharged home with wife. She is bedside and is taking him home. DC & RX instructions given and patient acknowledged understanding. IV removed and patient packed belongings.

## 2017-10-27 LAB — HIV ANTIBODY (ROUTINE TESTING W REFLEX): HIV Screen 4th Generation wRfx: NONREACTIVE

## 2017-11-11 ENCOUNTER — Other Ambulatory Visit: Payer: Self-pay | Admitting: Family Medicine

## 2017-11-11 DIAGNOSIS — Z720 Tobacco use: Secondary | ICD-10-CM

## 2017-11-19 ENCOUNTER — Ambulatory Visit
Admission: RE | Admit: 2017-11-19 | Discharge: 2017-11-19 | Disposition: A | Discharge status: Home / Self Care | Payer: Managed Care, Other (non HMO) | Source: Ambulatory Visit | Attending: Family Medicine | Admitting: Family Medicine

## 2017-11-19 DIAGNOSIS — Z720 Tobacco use: Secondary | ICD-10-CM | POA: Insufficient documentation

## 2017-11-19 DIAGNOSIS — I712 Thoracic aortic aneurysm, without rupture: Secondary | ICD-10-CM | POA: Insufficient documentation

## 2017-11-19 DIAGNOSIS — I2510 Atherosclerotic heart disease of native coronary artery without angina pectoris: Secondary | ICD-10-CM | POA: Insufficient documentation

## 2017-11-26 ENCOUNTER — Ambulatory Visit
Admission: RE | Admit: 2017-11-26 | Discharge: 2017-11-26 | Disposition: A | Discharge status: Home / Self Care | Payer: Managed Care, Other (non HMO) | Source: Ambulatory Visit | Attending: Family Medicine | Admitting: Family Medicine

## 2017-11-26 DIAGNOSIS — Z720 Tobacco use: Secondary | ICD-10-CM

## 2017-11-26 DIAGNOSIS — I712 Thoracic aortic aneurysm, without rupture: Secondary | ICD-10-CM | POA: Diagnosis not present

## 2017-11-26 DIAGNOSIS — I2510 Atherosclerotic heart disease of native coronary artery without angina pectoris: Secondary | ICD-10-CM | POA: Diagnosis not present

## 2017-11-26 MED ORDER — IOPAMIDOL (ISOVUE-300) INJECTION 61%
75.0000 mL | Freq: Once | INTRAVENOUS | Status: AC | PRN
  Administered 2017-11-26: 75 mL via INTRAVENOUS

## 2017-11-27 DIAGNOSIS — R933 Abnormal findings on diagnostic imaging of other parts of digestive tract: Secondary | ICD-10-CM | POA: Insufficient documentation

## 2017-11-27 DIAGNOSIS — Z86010 Personal history of colonic polyps: Secondary | ICD-10-CM | POA: Insufficient documentation

## 2017-11-27 DIAGNOSIS — F1010 Alcohol abuse, uncomplicated: Secondary | ICD-10-CM | POA: Insufficient documentation

## 2017-11-27 DIAGNOSIS — R748 Abnormal levels of other serum enzymes: Secondary | ICD-10-CM | POA: Insufficient documentation

## 2017-11-27 HISTORY — DX: Abnormal findings on diagnostic imaging of other parts of digestive tract: R93.3

## 2018-02-02 ENCOUNTER — Ambulatory Visit
Admission: RE | Admit: 2018-02-02 | Discharge: 2018-02-02 | Disposition: A | Discharge status: Home / Self Care | Payer: Managed Care, Other (non HMO) | Source: Ambulatory Visit | Attending: Unknown Physician Specialty | Admitting: Unknown Physician Specialty

## 2018-02-02 ENCOUNTER — Ambulatory Visit: Payer: Managed Care, Other (non HMO) | Admitting: Anesthesiology

## 2018-02-02 ENCOUNTER — Encounter
Admission: RE | Disposition: A | Discharge status: Home / Self Care | Payer: Self-pay | Source: Ambulatory Visit | Attending: Unknown Physician Specialty

## 2018-02-02 DIAGNOSIS — Z86010 Personal history of colonic polyps: Secondary | ICD-10-CM | POA: Insufficient documentation

## 2018-02-02 DIAGNOSIS — I083 Combined rheumatic disorders of mitral, aortic and tricuspid valves: Secondary | ICD-10-CM | POA: Diagnosis not present

## 2018-02-02 DIAGNOSIS — Z8489 Family history of other specified conditions: Secondary | ICD-10-CM | POA: Insufficient documentation

## 2018-02-02 DIAGNOSIS — Z7982 Long term (current) use of aspirin: Secondary | ICD-10-CM | POA: Insufficient documentation

## 2018-02-02 DIAGNOSIS — I110 Hypertensive heart disease with heart failure: Secondary | ICD-10-CM | POA: Insufficient documentation

## 2018-02-02 DIAGNOSIS — Z1211 Encounter for screening for malignant neoplasm of colon: Secondary | ICD-10-CM | POA: Insufficient documentation

## 2018-02-02 DIAGNOSIS — E781 Pure hyperglyceridemia: Secondary | ICD-10-CM | POA: Diagnosis not present

## 2018-02-02 DIAGNOSIS — K640 First degree hemorrhoids: Secondary | ICD-10-CM | POA: Diagnosis not present

## 2018-02-02 DIAGNOSIS — I429 Cardiomyopathy, unspecified: Secondary | ICD-10-CM | POA: Diagnosis not present

## 2018-02-02 DIAGNOSIS — Z8249 Family history of ischemic heart disease and other diseases of the circulatory system: Secondary | ICD-10-CM | POA: Insufficient documentation

## 2018-02-02 DIAGNOSIS — Z8269 Family history of other diseases of the musculoskeletal system and connective tissue: Secondary | ICD-10-CM | POA: Insufficient documentation

## 2018-02-02 DIAGNOSIS — Z79899 Other long term (current) drug therapy: Secondary | ICD-10-CM | POA: Insufficient documentation

## 2018-02-02 DIAGNOSIS — I509 Heart failure, unspecified: Secondary | ICD-10-CM | POA: Insufficient documentation

## 2018-02-02 DIAGNOSIS — D122 Benign neoplasm of ascending colon: Secondary | ICD-10-CM | POA: Diagnosis not present

## 2018-02-02 DIAGNOSIS — M1990 Unspecified osteoarthritis, unspecified site: Secondary | ICD-10-CM | POA: Insufficient documentation

## 2018-02-02 DIAGNOSIS — F17210 Nicotine dependence, cigarettes, uncomplicated: Secondary | ICD-10-CM | POA: Diagnosis not present

## 2018-02-02 HISTORY — DX: Unspecified osteoarthritis, unspecified site: M19.90

## 2018-02-02 HISTORY — DX: Polyp of colon: K63.5

## 2018-02-02 HISTORY — DX: Cardiomyopathy, unspecified: I42.9

## 2018-02-02 HISTORY — DX: Male erectile dysfunction, unspecified: N52.9

## 2018-02-02 HISTORY — DX: Pure hyperglyceridemia: E78.1

## 2018-02-02 HISTORY — DX: Abnormal findings on diagnostic imaging of other parts of digestive tract: R93.3

## 2018-02-02 HISTORY — PX: COLONOSCOPY WITH PROPOFOL: SHX5780

## 2018-02-02 SURGERY — COLONOSCOPY WITH PROPOFOL
Anesthesia: General

## 2018-02-02 MED ORDER — PROPOFOL 10 MG/ML IV BOLUS
INTRAVENOUS | Status: DC | PRN
  Administered 2018-02-02: 100 mg via INTRAVENOUS

## 2018-02-02 MED ORDER — PHENYLEPHRINE HCL 10 MG/ML IJ SOLN
INTRAMUSCULAR | Status: DC | PRN
  Administered 2018-02-02: 100 ug via INTRAVENOUS

## 2018-02-02 MED ORDER — SODIUM CHLORIDE 0.9 % IV SOLN: INTRAVENOUS | Status: DC

## 2018-02-02 MED ORDER — SODIUM CHLORIDE 0.9 % IV SOLN
INTRAVENOUS | Status: DC
  Administered 2018-02-02: 1000 mL via INTRAVENOUS

## 2018-02-02 MED ORDER — PROPOFOL 500 MG/50ML IV EMUL
INTRAVENOUS | Status: DC | PRN
  Administered 2018-02-02: 200 ug/kg/min via INTRAVENOUS

## 2018-02-02 NOTE — Anesthesia Post-op Follow-up Note (Signed)
Anesthesia QCDR form completed.        

## 2018-02-02 NOTE — Op Note (Signed)
[facility] Patient Name: Alexander Welch Procedure Date: 02/02/2018 4:31 PM MRN: [id number] Account #: [id number] Date of Birth: [birth year] Admit Type: Outpatient Age: 57 Room: [facility] ENDO ROOM 2 Gender: Male Note Status: Finalized Procedure:            Colonoscopy Indications:          Screening for colorectal malignant neoplasm Providers:            Manya Silvas, MD Referring MD:         Sofie Hartigan (Referring MD) Medicines:            Propofol per Anesthesia Complications:        No immediate complications. Procedure:            Pre-Anesthesia Assessment:                       - After reviewing the risks and benefits, the patient                        was deemed in satisfactory condition to undergo the                        procedure.                       After obtaining informed consent, the colonoscope was                        passed under direct vision. Throughout the procedure,                        the patient's blood pressure, pulse, and oxygen                        saturations were monitored continuously. The                        Colonoscope was introduced through the anus and                        advanced to the the cecum, identified by appendiceal                        orifice and ileocecal valve. The colonoscopy was                        performed without difficulty. The patient tolerated the                        procedure well. The quality of the bowel preparation                        was good. Findings:      A small polyp was found in the ascending colon. The polyp was sessile.       The polyp was removed with a hot snare. Resection and retrieval were       complete.      Internal hemorrhoids were found during endoscopy. The hemorrhoids were       small and Grade I (internal hemorrhoids that do not prolapse).      The exam was otherwise  without abnormality. Impression:           - One small polyp in  the ascending colon, removed with                        a hot snare. Resected and retrieved.                       - Internal hemorrhoids.                       - The examination was otherwise normal. Recommendation:       - Await pathology results. Manya Silvas, MD 02/02/2018 5:00:36 PM This report has been signed electronically. Number of Addenda: 0 Note Initiated On: 02/02/2018 4:31 PM Scope Withdrawal Time: 0 hours 17 minutes 15 seconds  Total Procedure Duration: 0 hours 19 minutes 50 seconds       [place] [facility]

## 2018-02-02 NOTE — Transfer of Care (Signed)
Immediate Anesthesia Transfer of Care Note  Patient: Alexander Welch  Procedure(s) Performed: COLONOSCOPY WITH PROPOFOL (N/A )  Patient Location: Endoscopy Unit  Anesthesia Type:General  Level of Consciousness: sedated  Airway & Oxygen Therapy: Patient Spontanous Breathing  Post-op Assessment: Post -op Vital signs reviewed and stable  Post vital signs: stable  Last Vitals:  Vitals Value Taken Time  BP    Temp    Pulse    Resp    SpO2      Last Pain:  Vitals:   02/02/18 1657  TempSrc: (P) Tympanic  PainSc:          Complications: No apparent anesthesia complications

## 2018-02-02 NOTE — Anesthesia Postprocedure Evaluation (Signed)
Anesthesia Post Note  Patient: Alexander Welch  Procedure(s) Performed: COLONOSCOPY WITH PROPOFOL (N/A )  Patient location during evaluation: PACU Anesthesia Type: General Level of consciousness: awake and alert Pain management: pain level controlled Vital Signs Assessment: post-procedure vital signs reviewed and stable Respiratory status: spontaneous breathing, nonlabored ventilation and respiratory function stable Cardiovascular status: blood pressure returned to baseline and stable Postop Assessment: no apparent nausea or vomiting Anesthetic complications: no     Last Vitals:  Vitals:   02/02/18 1707 02/02/18 1717  BP: 135/85 115/74  Pulse: 63 (!) 56  Resp: 16 16  Temp:    SpO2: 100% 100%    Last Pain:  Vitals:   02/02/18 1717  TempSrc:   PainSc: 0-No pain                 Durenda Hurt

## 2018-02-02 NOTE — H&P (Signed)
Primary Care Physician:  Sofie Hartigan, MD Primary Gastroenterologist:  Dr. Vira Agar  Pre-Procedure History & Physical: HPI:  Alexander Welch is a 57 y.o. male is here for an colonoscopy.Done for [facility] colon polyps. Last procedure was 07/13/2009   Past Medical History:  Diagnosis Date  . Abnormal CT scan, colon 11/27/2017  . Alcohol abuse   . Cardiomyopathy ([place])   . CHF (congestive heart failure) ([place])   . Colon polyps   . ED (erectile dysfunction)   . Hypertension   . Hypertriglyceridemia   . Osteoarthritis   . Osteoarthritis     Past Surgical History:  Procedure Laterality Date  . COLONOSCOPY      Prior to Admission medications   Medication Sig Start Date End Date Taking? Authorizing Provider  aspirin EC 81 MG tablet Take 1 tablet by mouth daily.   Yes [provider]  fenofibrate 160 MG tablet Take 1 tablet by mouth daily. 01/09/17  Yes [provider]  furosemide (LASIX) 40 MG tablet Take 1 tablet by mouth daily. 01/09/17  Yes [provider]  lisinopril-hydrochlorothiazide (PRINZIDE,ZESTORETIC) 20-12.5 MG tablet Take 1 tablet by mouth daily. 01/09/17  Yes [provider]  nicotine (NICODERM CQ) 21 mg/24hr patch Place 1 patch (21 mg total) onto the skin daily. Patient not taking: Reported on 02/02/2018 10/25/17   Bettey Costa, MD  ondansetron (ZOFRAN ODT) 4 MG disintegrating tablet Take 1 tablet (4 mg total) by mouth every 8 (eight) hours as needed for nausea or vomiting. Patient not taking: Reported on 02/02/2018 10/24/17   Earleen Newport, MD  sildenafil (REVATIO) 20 MG tablet Take 20 mg by mouth as needed.    [provider]    Allergies as of 01/02/2018  . (No Known Allergies)    Family History  Problem Relation Age of Onset  . Heart failure Mother   . Renal Disease Mother   . Bone cancer Father     Social History   Socioeconomic History  . Marital status: Married    Spouse name: Not on file  . Number of  children: Not on file  . Years of education: Not on file  . Highest education level: Not on file  Occupational History  . Not on file  Social Needs  . Financial resource strain: Not on file  . Food insecurity:    Worry: Not on file    Inability: Not on file  . Transportation needs:    Medical: Not on file    Non-medical: Not on file  Tobacco Use  . Smoking status: Current Every Day Smoker    Packs/day: 0.50    Years: 40.00    Pack years: 20.00    Types: Cigarettes  . Smokeless tobacco: Never Used  Substance and Sexual Activity  . Alcohol use: Yes    Comment: 1 pint of vodka daily  . Drug use: Yes    Types: Marijuana  . Sexual activity: Not on file  Lifestyle  . Physical activity:    Days per week: Not on file    Minutes per session: Not on file  . Stress: Not on file  Relationships  . Social connections:    Talks on phone: Not on file    Gets together: Not on file    Attends religious service: Not on file    Active member of club or organization: Not on file    Attends meetings of clubs or organizations: Not on file    Relationship  status: Not on file  . Intimate partner violence:    Fear of current or ex partner: Not on file    Emotionally abused: Not on file    Physically abused: Not on file    Forced sexual activity: Not on file  Other Topics Concern  . Not on file  Social History Narrative  . Not on file    Review of Systems: See HPI, otherwise negative ROS  Physical Exam: BP 138/87   Pulse 64   Temp (!) 97.1 F (36.2 C)   Resp 18   Ht 5\' 7"  (1.702 m)   Wt 75.3 kg   SpO2 100%   BMI 26.00 kg/m  General:   Alert,  pleasant and cooperative in NAD Head:  Normocephalic and atraumatic. Neck:  Supple; no masses or thyromegaly. Lungs:  Clear throughout to auscultation.    Heart:  Regular rate and rhythm. Abdomen:  Soft, nontender and nondistended. Normal bowel sounds, without guarding, and without rebound.   Neurologic:  Alert and  oriented x4;   grossly normal neurologically.  Impression/Plan: Alexander Welch is here for an colonoscopy to be performed for [facility] colon polyps.  Risks, benefits, limitations, and alternatives regarding  colonoscopy have been reviewed with the patient.  Questions have been answered.  All parties agreeable.   Gaylyn Cheers, MD  02/02/2018, 4:31 PM

## 2018-02-02 NOTE — Anesthesia Preprocedure Evaluation (Addendum)
Anesthesia Evaluation  Patient identified by MRN, date of birth, ID band Patient awake    Reviewed: Allergy & Precautions, H&P , NPO status , Patient's Chart, lab work & pertinent test results  Airway Mallampati: III  TM Distance: >3 FB Neck ROM: full    Dental  (+) Chipped, Poor Dentition, Missing   Pulmonary Current Smoker,           Cardiovascular hypertension, +CHF    Echo 12/22/17: MODERATE-TO-SEVERE LV SYSTOLIC DYSFUNCTION WITH AN ESTIMATED EF = 25-30 % NORMAL RIGHT VENTRICULAR SYSTOLIC FUNCTION MODERATE MITRAL VALVE INSUFFICIENCY MILD-TO-MODERATE AORTIC VALVE INSUFFICIENCY MILD TRICUSPID VALVE INSUFFICIENCY NO VALVULAR STENOSIS MODERATE LV ENLARGEMENT MILD LA ENLARGEMENT   Neuro/Psych PSYCHIATRIC DISORDERS negative neurological ROS  negative psych ROS   GI/Hepatic negative GI ROS, (+)     substance abuse  alcohol use,   Endo/Other  negative endocrine ROS  Renal/GU negative Renal ROS  negative genitourinary   Musculoskeletal  (+) Arthritis ,   Abdominal   Peds  Hematology negative hematology ROS (+)   Anesthesia Other Findings Past Medical History: 11/27/2017: Abnormal CT scan, colon No date: Alcohol abuse No date: Cardiomyopathy ([place]) No date: CHF (congestive heart failure) ([facility]) No date: Colon polyps No date: ED (erectile dysfunction) No date: Hypertension No date: Hypertriglyceridemia No date: Osteoarthritis No date: Osteoarthritis  Past Surgical History: No date: COLONOSCOPY  BMI    Body Mass Index:  26.00 kg/m      Reproductive/Obstetrics negative OB ROS                            Anesthesia Physical Anesthesia Plan  ASA: III  Anesthesia Plan: General   Post-op Pain Management:    Induction:   PONV Risk Score and Plan: Propofol infusion and TIVA  Airway Management Planned:   Additional Equipment:   Intra-op Plan:   Post-operative Plan:    Informed Consent: I have reviewed the patients History and Physical, chart, labs and discussed the procedure including the risks, benefits and alternatives for the proposed anesthesia with the patient or authorized representative who has indicated his/her understanding and acceptance.   Dental Advisory Given  Plan Discussed with: Anesthesiologist, CRNA and Surgeon  Anesthesia Plan Comments:        Anesthesia Quick Evaluation

## 2018-02-03 ENCOUNTER — Encounter: Payer: Self-pay | Admitting: Unknown Physician Specialty

## 2018-02-05 LAB — SURGICAL PATHOLOGY

## 2018-09-17 ENCOUNTER — Inpatient Hospital Stay: Payer: Managed Care, Other (non HMO) | Attending: Oncology | Admitting: Oncology

## 2018-09-17 ENCOUNTER — Other Ambulatory Visit: Payer: Self-pay

## 2018-09-17 ENCOUNTER — Encounter: Payer: Self-pay | Admitting: Oncology

## 2018-09-17 DIAGNOSIS — I10 Essential (primary) hypertension: Secondary | ICD-10-CM | POA: Insufficient documentation

## 2018-09-17 DIAGNOSIS — D649 Anemia, unspecified: Secondary | ICD-10-CM

## 2018-09-17 DIAGNOSIS — F17210 Nicotine dependence, cigarettes, uncomplicated: Secondary | ICD-10-CM | POA: Insufficient documentation

## 2018-09-17 DIAGNOSIS — Z79899 Other long term (current) drug therapy: Secondary | ICD-10-CM | POA: Insufficient documentation

## 2018-09-17 DIAGNOSIS — Z7982 Long term (current) use of aspirin: Secondary | ICD-10-CM | POA: Insufficient documentation

## 2018-09-17 DIAGNOSIS — E781 Pure hyperglyceridemia: Secondary | ICD-10-CM | POA: Insufficient documentation

## 2018-09-17 NOTE — Progress Notes (Signed)
RN called pt for new patient admission, pt verbalized understanding that MD would call for [facility]ximity later today.  Pt denies any concerns today ""just anxious to hear what the doctor has to say".

## 2018-09-19 NOTE — Progress Notes (Signed)
[place]  Telephone:([phone] Fax:([phone]  HEMATOLOGY-ONCOLOGY TELEMEDICINE VISIT PROGRESS NOTE  ID: Alexander Welch OB: 08-09-1960  MR#: [id number]  UXN#:[id number]  Patient Care Team: Sofie Hartigan, MD as PCP - General (Family Medicine)  I connected with@ on 09/19/18 at  3:00 PM EDT by video enabled telemedicine visit and verified that I am speaking with the correct person using two identifiers.   I discussed the limitations, risks, security and privacy concerns of performing an evaluation and management service by telemedicine and the availability of in-person appointments. I also discussed with the patient that there may be a patient responsible charge related to this service. The patient expressed understanding and agreed to proceed.  Other persons participating in the visit and their role in the encounter: Patient, MD  PATIENT'S LOCATION: Home PROVIDER'S LOCATION: Clinic  CHIEF COMPLAINT: Anemia, unspecified.  INTERVAL HISTORY: Patient is a 58 year old male who has a longstanding history of anemia, his hemoglobin has recently trended down.  He currently feels well and is asymptomatic.  He does not complain of weakness or fatigue.  He continues to be active and work full-time.  He has no neurologic complaints.  He denies any recent fevers or illnesses.  He has a good appetite and denies weight loss.  He has no chest pain, shortness of breath, cough, or hemoptysis.  He denies any nausea, vomiting, constipation, or diarrhea.  He denies any melena or hematochezia.  He has no urinary complaints.  Patient feels at his baseline and offers no specific complaints today.  REVIEW OF SYSTEMS:   Review of Systems  Constitutional: Negative.  Negative for fever, malaise/fatigue and weight loss.  Respiratory: Negative.  Negative for cough, hemoptysis and shortness of breath.   Cardiovascular: Negative.  Negative for chest pain and leg swelling.   Gastrointestinal: Negative.  Negative for abdominal pain, blood in stool and melena.  Genitourinary: Negative.  Negative for hematuria.  Musculoskeletal: Negative.  Negative for back pain.  Skin: Negative.  Negative for rash.  Neurological: Negative for dizziness, focal weakness, weakness and headaches.  Psychiatric/Behavioral: Negative.  The patient is not nervous/anxious.     As per HPI. Otherwise, a complete review of systems is negative.  PAST MEDICAL HISTORY: Past Medical History:  Diagnosis Date  . Abnormal CT scan, colon 11/27/2017  . Alcohol abuse   . Cardiomyopathy ([place])   . CHF (congestive heart failure) ([place])   . Colon polyps   . ED (erectile dysfunction)   . Hypertension   . Hypertriglyceridemia   . Osteoarthritis   . Osteoarthritis     PAST SURGICAL HISTORY: Past Surgical History:  Procedure Laterality Date  . COLONOSCOPY    . COLONOSCOPY WITH PROPOFOL N/A 02/02/2018   Procedure: COLONOSCOPY WITH PROPOFOL;  Surgeon: Manya Silvas, MD;  Location: [facility] ENDOSCOPY;  Service: Endoscopy;  Laterality: N/A;    FAMILY HISTORY: Family History  Problem Relation Age of Onset  . Heart failure Mother   . Renal Disease Mother   . Bone cancer Father     ADVANCED DIRECTIVES (Y/N):  N  HEALTH MAINTENANCE: Social History   Tobacco Use  . Smoking status: Current Every Day Smoker    Packs/day: 0.50    Years: 40.00    Pack years: 20.00    Types: Cigarettes  . Smokeless tobacco: Never Used  Substance Use Topics  . Alcohol use: Yes    Comment: 1 pint of vodka daily  . Drug use: Yes    Types: Marijuana  Colonoscopy:  PAP:  Bone density:  Lipid panel:  No Known Allergies  Current Outpatient Medications  Medication Sig Dispense Refill  . aspirin EC 81 MG tablet Take 1 tablet by mouth daily.    . Cetirizine HCl 10 MG CAPS Take by mouth.    . fenofibrate 160 MG tablet Take 1 tablet by mouth daily.    . ferrous sulfate 325 (65 FE) MG tablet Take by  mouth.    Marland Kitchen lisinopril-hydrochlorothiazide (PRINZIDE,ZESTORETIC) 20-12.5 MG tablet Take 1 tablet by mouth daily.    . sildenafil (REVATIO) 20 MG tablet Take 20 mg by mouth as needed.    . vitamin B-12 (CYANOCOBALAMIN) 1000 MCG tablet Take by mouth.    . nicotine (NICODERM CQ) 21 mg/24hr patch Place 1 patch (21 mg total) onto the skin daily. (Patient not taking: Reported on 02/02/2018) 28 patch 0   No current facility-administered medications for this visit.     OBJECTIVE: There were no vitals filed for this visit.   There is no height or weight on file to calculate BMI.    ECOG FS:0 - Asymptomatic  General: Well-developed, well-nourished, no acute distress. HEENT: Normocephalic. Neuro: Alert, answering all questions appropriately. Cranial nerves grossly intact. Skin: No rashes or petechiae noted. Psych: Normal affect.  LAB RESULTS:  Lab Results  Component Value Date   NA 136 10/25/2017   K 3.6 10/25/2017   CL 105 10/25/2017   CO2 25 10/25/2017   GLUCOSE 101 (H) 10/25/2017   BUN 12 10/25/2017   CREATININE 1.06 10/25/2017   CALCIUM 7.9 (L) 10/25/2017   PROT 5.8 (L) 10/25/2017   ALBUMIN 3.2 (L) 10/25/2017   AST 37 10/25/2017   ALT 16 10/25/2017   ALKPHOS 29 (L) 10/25/2017   BILITOT 1.1 10/25/2017   GFRNONAA >60 10/25/2017   GFRAA >60 10/25/2017    Lab Results  Component Value Date   WBC 11.3 (H) 10/24/2017   NEUTROABS 6.7 (H) 10/24/2017   HGB 11.7 (L) 10/24/2017   HCT 32.9 (L) 10/24/2017   MCV 98.0 10/24/2017   PLT 337 10/24/2017     STUDIES: No results found.  ASSESSMENT: Anemia, unspecified.  PLAN:   1.  Anemia, unspecified: Patient's most recent hemoglobin was reported as 9.9.  He was also noted to have an MCV of 105.6.  He had a colonoscopy completed on February 02, 2018 that removed 1 polyp, but was otherwise was reported as normal.  Patient will return to clinic next week for laboratory work-up.  He will then have a repeat video enabled telemedicine visit  in approximately 3 weeks to discuss his results. 2.  Elevated MCV: Will order B12 and folate with laboratory work as above.  I discussed the assessment and treatment plan with the patient. The patient was provided an opportunity to ask questions and all were answered. The patient agreed with the plan and demonstrated an understanding of the instructions.   The patient was advised to call back or seek an in-person evaluation if the symptoms worsen or if the condition fails to improve as anticipated.  I provided 45 minutes of face-to-face video visit time during this encounter, and > 50% was spent counseling as documented under my assessment & plan.  Lloyd Huger, MD 09/19/2018 7:03 AM  Cancer Staging No matching staging information was found for the patient.

## 2018-09-22 ENCOUNTER — Inpatient Hospital Stay: Payer: Managed Care, Other (non HMO)

## 2018-09-22 ENCOUNTER — Other Ambulatory Visit: Payer: Self-pay

## 2018-09-22 DIAGNOSIS — Z7982 Long term (current) use of aspirin: Secondary | ICD-10-CM | POA: Diagnosis not present

## 2018-09-22 DIAGNOSIS — F17210 Nicotine dependence, cigarettes, uncomplicated: Secondary | ICD-10-CM | POA: Diagnosis not present

## 2018-09-22 DIAGNOSIS — D649 Anemia, unspecified: Secondary | ICD-10-CM

## 2018-09-22 DIAGNOSIS — I10 Essential (primary) hypertension: Secondary | ICD-10-CM | POA: Diagnosis not present

## 2018-09-22 DIAGNOSIS — E781 Pure hyperglyceridemia: Secondary | ICD-10-CM | POA: Diagnosis not present

## 2018-09-22 DIAGNOSIS — Z79899 Other long term (current) drug therapy: Secondary | ICD-10-CM | POA: Diagnosis not present

## 2018-09-22 LAB — IRON AND TIBC
Iron: 168 ug/dL (ref 45–182)
Saturation Ratios: 47 % — ABNORMAL HIGH (ref 17.9–39.5)
TIBC: 361 ug/dL (ref 250–450)
UIBC: 193 ug/dL

## 2018-09-22 LAB — CBC
HCT: 32.1 % — ABNORMAL LOW (ref 39.0–52.0)
Hemoglobin: 11 g/dL — ABNORMAL LOW (ref 13.0–17.0)
MCH: 35.8 pg — ABNORMAL HIGH (ref 26.0–34.0)
MCHC: 34.3 g/dL (ref 30.0–36.0)
MCV: 104.6 fL — ABNORMAL HIGH (ref 80.0–100.0)
Platelets: 350 10*3/uL (ref 150–400)
RBC: 3.07 MIL/uL — ABNORMAL LOW (ref 4.22–5.81)
RDW: 13.5 % (ref 11.5–15.5)
WBC: 10.1 10*3/uL (ref 4.0–10.5)
nRBC: 0 % (ref 0.0–0.2)

## 2018-09-22 LAB — LACTATE DEHYDROGENASE: LDH: 157 U/L (ref 98–192)

## 2018-09-22 LAB — FERRITIN: Ferritin: 1414 ng/mL — ABNORMAL HIGH (ref 24–336)

## 2018-09-22 LAB — RETICULOCYTES
Immature Retic Fract: 6.7 % (ref 2.3–15.9)
RBC.: 3.07 MIL/uL — ABNORMAL LOW (ref 4.22–5.81)
Retic Count, Absolute: 73.1 10*3/uL (ref 19.0–186.0)
Retic Ct Pct: 2.4 % (ref 0.4–3.1)

## 2018-09-22 LAB — FOLATE: Folate: 4.9 ng/mL — ABNORMAL LOW (ref >5.9)

## 2018-09-22 LAB — DAT, POLYSPECIFIC AHG (ARMC ONLY): Polyspecific AHG test: NEGATIVE

## 2018-09-22 LAB — VITAMIN B12: Vitamin B-12: 849 pg/mL (ref 180–914)

## 2018-09-23 LAB — PROTEIN ELECTROPHORESIS, SERUM
A/G Ratio: 1.3 (ref 0.7–1.7)
Albumin ELP: 3.9 g/dL (ref 2.9–4.4)
Alpha-1-Globulin: 0.2 g/dL (ref 0.0–0.4)
Alpha-2-Globulin: 0.9 g/dL (ref 0.4–1.0)
Beta Globulin: 0.9 g/dL (ref 0.7–1.3)
Gamma Globulin: 1.2 g/dL (ref 0.4–1.8)
Globulin, Total: 3.1 g/dL (ref 2.2–3.9)
M-Spike, %: 0.4 g/dL — ABNORMAL HIGH
Total Protein ELP: 7 g/dL (ref 6.0–8.5)

## 2018-09-23 LAB — HAPTOGLOBIN: Haptoglobin: 208 mg/dL (ref 29–370)

## 2018-09-23 LAB — ERYTHROPOIETIN: Erythropoietin: 18 m[IU]/mL (ref 2.6–18.5)

## 2018-10-10 NOTE — Progress Notes (Signed)
[place]  Telephone:([phone] [phone]  HEMATOLOGY-ONCOLOGY TELEMEDICINE VISIT PROGRESS NOTE  ID: Alexander Welch OB: Sep 01, 1960  MR#: [id number]  ALP#:[id number]  Patient Care Team: Sofie Hartigan, MD as PCP - General (Family Medicine)   CHIEF COMPLAINT: Anemia, unspecified.  INTERVAL HISTORY: Patient returns to clinic today for further evaluation and discussion of his laboratory work.  He continues to feel well and remains asymptomatic.  He continues to be active and work full-time. He has no neurologic complaints.  He denies any recent fevers or illnesses.  He has a good appetite and denies weight loss.  He has no chest pain, shortness of breath, cough, or hemoptysis.  He denies any nausea, vomiting, constipation, or diarrhea.  He denies any melena or hematochezia.  He has no urinary complaints.  Patient feels at his baseline offers no specific complaints today.  REVIEW OF SYSTEMS:   Review of Systems  Constitutional: Negative.  Negative for fever, malaise/fatigue and weight loss.  Respiratory: Negative.  Negative for cough, hemoptysis and shortness of breath.   Cardiovascular: Negative.  Negative for chest pain and leg swelling.  Gastrointestinal: Negative.  Negative for abdominal pain, blood in stool and melena.  Genitourinary: Negative.  Negative for hematuria.  Musculoskeletal: Negative.  Negative for back pain.  Skin: Negative.  Negative for rash.  Neurological: Negative for dizziness, focal weakness, weakness and headaches.  Psychiatric/Behavioral: Negative.  The patient is not nervous/anxious.     As per HPI. Otherwise, a complete review of systems is negative.  PAST MEDICAL HISTORY: Past Medical History:  Diagnosis Date  . Abnormal CT scan, colon 11/27/2017  . Alcohol abuse   . Cardiomyopathy ([place])   . CHF (congestive heart failure) ([place])   . Colon polyps   . ED (erectile dysfunction)   . Hypertension   .  Hypertriglyceridemia   . Osteoarthritis   . Osteoarthritis     PAST SURGICAL HISTORY: Past Surgical History:  Procedure Laterality Date  . COLONOSCOPY    . COLONOSCOPY WITH PROPOFOL N/A 02/02/2018   Procedure: COLONOSCOPY WITH PROPOFOL;  Surgeon: Manya Silvas, MD;  Location: [facility] ENDOSCOPY;  Service: Endoscopy;  Laterality: N/A;    FAMILY HISTORY: Family History  Problem Relation Age of Onset  . Heart failure Mother   . Renal Disease Mother   . Bone cancer Father     ADVANCED DIRECTIVES (Y/N):  N  HEALTH MAINTENANCE: Social History   Tobacco Use  . Smoking status: Current Every Day Smoker    Packs/day: 0.50    Years: 40.00    Pack years: 20.00    Types: Cigarettes  . Smokeless tobacco: Never Used  Substance Use Topics  . Alcohol use: Yes    Comment: 1 pint of vodka daily  . Drug use: Yes    Types: Marijuana     Colonoscopy:  PAP:  Bone density:  Lipid panel:  No Known Allergies  Current Outpatient Medications  Medication Sig Dispense Refill  . aspirin EC 81 MG tablet Take 1 tablet by mouth daily.    . Cetirizine HCl 10 MG CAPS Take by mouth.    . fenofibrate 160 MG tablet Take 1 tablet by mouth daily.    . ferrous sulfate 325 (65 FE) MG tablet Take by mouth.    Marland Kitchen lisinopril-hydrochlorothiazide (PRINZIDE,ZESTORETIC) 20-12.5 MG tablet Take 1 tablet by mouth daily.    . sildenafil (REVATIO) 20 MG tablet Take 20 mg by mouth as needed.    . vitamin B-12 (  CYANOCOBALAMIN) 1000 MCG tablet Take by mouth.    . folic acid (FOLVITE) 1 MG tablet Take 1 tablet (1 mg total) by mouth daily. 30 tablet 5  . nicotine (NICODERM CQ) 21 mg/24hr patch Place 1 patch (21 mg total) onto the skin daily. (Patient not taking: Reported on 02/02/2018) 28 patch 0   No current facility-administered medications for this visit.     OBJECTIVE: There were no vitals filed for this visit.   There is no height or weight on file to calculate BMI.    ECOG FS:0 - Asymptomatic  General:  Well-developed, well-nourished, no acute distress. Eyes: Pink conjunctiva, anicteric sclera. HEENT: Normocephalic, moist mucous membranes. Lungs: Clear to auscultation bilaterally. Heart: Regular rate and rhythm. No rubs, murmurs, or gallops. Abdomen: Soft, nontender, nondistended. No organomegaly noted, normoactive bowel sounds. Musculoskeletal: No edema, cyanosis, or clubbing. Neuro: Alert, answering all questions appropriately. Cranial nerves grossly intact. Skin: No rashes or petechiae noted. Psych: Normal affect.  LAB RESULTS:  Lab Results  Component Value Date   NA 136 10/25/2017   K 3.6 10/25/2017   CL 105 10/25/2017   CO2 25 10/25/2017   GLUCOSE 101 (H) 10/25/2017   BUN 12 10/25/2017   CREATININE 1.06 10/25/2017   CALCIUM 7.9 (L) 10/25/2017   PROT 5.8 (L) 10/25/2017   ALBUMIN 3.2 (L) 10/25/2017   AST 37 10/25/2017   ALT 16 10/25/2017   ALKPHOS 29 (L) 10/25/2017   BILITOT 1.1 10/25/2017   GFRNONAA >60 10/25/2017   GFRAA >60 10/25/2017    Lab Results  Component Value Date   WBC 10.1 09/22/2018   NEUTROABS 6.7 (H) 10/24/2017   HGB 11.0 (L) 09/22/2018   HCT 32.1 (L) 09/22/2018   MCV 104.6 (H) 09/22/2018   PLT 350 09/22/2018     STUDIES: No results found.  ASSESSMENT: Anemia, unspecified.  PLAN:   1.  Anemia, unspecified: Patient's most recent hemoglobin has improved to 11.0.  He was noted to have a slight macrocytosis and a folate deficiency.  The remainder of his laboratory work other than a mild M spike on his SPEP of 0.4 was either negative or within normal limits.  I recommended patient take 1 mg folic acid daily.  Return to clinic in 3 months with repeat laboratory work and further evaluation via video enabled telemedicine. 2.  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  Continue to monitor. 3.  Macrocytosis: Possibly secondary to folate deficiency.  Folic acid supplementation as above.  I spent a total of 30 minutes face-to-face with the patient of which  greater than 50% of the visit was spent in counseling and coordination of care as detailed above.   Lloyd Huger, MD 10/16/2018 9:15 AM

## 2018-10-13 ENCOUNTER — Inpatient Hospital Stay: Payer: Managed Care, Other (non HMO) | Attending: Oncology | Admitting: Oncology

## 2018-10-13 ENCOUNTER — Other Ambulatory Visit: Payer: Self-pay

## 2018-10-13 DIAGNOSIS — I10 Essential (primary) hypertension: Secondary | ICD-10-CM | POA: Insufficient documentation

## 2018-10-13 DIAGNOSIS — Z79899 Other long term (current) drug therapy: Secondary | ICD-10-CM | POA: Insufficient documentation

## 2018-10-13 DIAGNOSIS — D649 Anemia, unspecified: Secondary | ICD-10-CM | POA: Diagnosis not present

## 2018-10-13 DIAGNOSIS — D472 Monoclonal gammopathy: Secondary | ICD-10-CM

## 2018-10-13 DIAGNOSIS — F17210 Nicotine dependence, cigarettes, uncomplicated: Secondary | ICD-10-CM | POA: Diagnosis not present

## 2018-10-13 DIAGNOSIS — Z7982 Long term (current) use of aspirin: Secondary | ICD-10-CM | POA: Diagnosis not present

## 2018-10-13 MED ORDER — FOLIC ACID 1 MG PO TABS: 1.0000 mg | ORAL_TABLET | Freq: Every day | ORAL | 5 refills | Status: DC

## 2018-10-13 NOTE — Progress Notes (Signed)
Patient follow up visit today regarding anemia.

## 2018-10-27 ENCOUNTER — Other Ambulatory Visit: Payer: Self-pay

## 2018-10-27 DIAGNOSIS — Z20822 Contact with and (suspected) exposure to covid-19: Secondary | ICD-10-CM

## 2018-10-29 LAB — NOVEL CORONAVIRUS, NAA: SARS-CoV-2, NAA: NOT DETECTED

## 2018-11-03 ENCOUNTER — Telehealth: Payer: Self-pay | Admitting: General Practice

## 2018-11-03 NOTE — Telephone Encounter (Signed)
Negative results was given to pt,

## 2018-11-04 NOTE — Telephone Encounter (Signed)
Patient requesting COVID results fax # [phone]

## 2019-01-08 ENCOUNTER — Other Ambulatory Visit: Payer: Self-pay

## 2019-01-08 DIAGNOSIS — D649 Anemia, unspecified: Secondary | ICD-10-CM

## 2019-01-10 NOTE — Progress Notes (Signed)
  [place]  [phone] [phone]) [phone]   ID: YOANDRI ANDERER OB: 1960-05-26  MR#: [id number]  [id number]  Patient Care Team: Sofie Hartigan, MD as PCP - General (Family Medicine)      Lloyd Huger, MD 01/17/2019 11:19 PM    This encounter was created in error - please disregard.

## 2019-01-11 ENCOUNTER — Inpatient Hospital Stay: Payer: Managed Care, Other (non HMO)

## 2019-01-14 ENCOUNTER — Inpatient Hospital Stay: Payer: Managed Care, Other (non HMO) | Admitting: Oncology

## 2019-01-14 NOTE — Progress Notes (Signed)
Contacted patient to initiate virtual visit today with Dr. Grayland Ormond. Left vm requesting a return phone call from patient. [place]

## 2019-01-22 ENCOUNTER — Other Ambulatory Visit: Payer: Self-pay

## 2019-01-23 NOTE — Progress Notes (Signed)
[place]  Telephone:([phone] Fax:([phone]  HEMATOLOGY-ONCOLOGY TELEMEDICINE VISIT PROGRESS NOTE  ID: CAMARION WEIER OB: May 29, 1960  MR#: [id number]  IRC#:[id number]  Patient Care Team: Sofie Hartigan, MD as PCP - General (Family Medicine)   I connected with Julien Nordmann on 01/28/19 at  3:30 PM EDT by video enabled telemedicine visit and verified that I am speaking with the correct person using two identifiers.   I discussed the limitations, risks, security and privacy concerns of performing an evaluation and management service by telemedicine and the availability of in-person appointments. I also discussed with the patient that there may be a patient responsible charge related to this service. The patient expressed understanding and agreed to proceed.   Other persons participating in the visit and their role in the encounter: Patient, MD  Patient's location: Home Provider's location: Clinic  CHIEF COMPLAINT: Anemia, unspecified.  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  He continues to remain active and work full-time, although admits to a slight increase in fatigue towards the end of the day.  He otherwise feels well and is asymptomatic.  He has no neurologic complaints.  He denies any recent fevers or illnesses.  He has a good appetite and denies weight loss.  He has no chest pain, shortness of breath, cough, or hemoptysis.  He denies any nausea, vomiting, constipation, or diarrhea.  He denies any melena or hematochezia.  He has no urinary complaints.  Patient offers no further specific complaints today.  REVIEW OF SYSTEMS:   Review of Systems  Constitutional: Negative.  Negative for fever, malaise/fatigue and weight loss.  Respiratory: Negative.  Negative for cough, hemoptysis and shortness of breath.   Cardiovascular: Negative.  Negative for chest pain  and leg swelling.  Gastrointestinal: Negative.  Negative for abdominal pain, blood in stool and melena.  Genitourinary: Negative.  Negative for hematuria.  Musculoskeletal: Negative.  Negative for back pain.  Skin: Negative.  Negative for rash.  Neurological: Negative for dizziness, focal weakness, weakness and headaches.  Psychiatric/Behavioral: Negative.  The patient is not nervous/anxious.     As per HPI. Otherwise, a complete review of systems is negative.  PAST MEDICAL HISTORY: Past Medical History:  Diagnosis Date  . Abnormal CT scan, colon 11/27/2017  . Alcohol abuse   . Cardiomyopathy ([place])   . CHF (congestive heart failure) ([place])   . Colon polyps   . ED (erectile dysfunction)   . Hypertension   . Hypertriglyceridemia   . Osteoarthritis   . Osteoarthritis     PAST SURGICAL HISTORY: Past Surgical History:  Procedure Laterality Date  . COLONOSCOPY    . COLONOSCOPY WITH PROPOFOL N/A 02/02/2018   Procedure: COLONOSCOPY WITH PROPOFOL;  Surgeon: Manya Silvas, MD;  Location: [facility] ENDOSCOPY;  Service: Endoscopy;  Laterality: N/A;    FAMILY HISTORY: Family History  Problem Relation Age of Onset  . Heart failure Mother   . Renal Disease Mother   . Bone cancer Father     ADVANCED DIRECTIVES (Y/N):  N  HEALTH MAINTENANCE: Social History   Tobacco Use  . Smoking status: Current Every Day Smoker    Packs/day: 0.50    Years: 40.00    Pack years: 20.00    Types: Cigarettes  . Smokeless tobacco: Never Used  Substance Use Topics  . Alcohol use: Yes    Comment: 1 pint of vodka daily  . Drug use: Yes  Types: Marijuana     Colonoscopy:  PAP:  Bone density:  Lipid panel:  No Known Allergies  Current Outpatient Medications  Medication Sig Dispense Refill  . aspirin EC 81 MG tablet Take 1 tablet by mouth daily.    . Cetirizine HCl 10 MG CAPS Take by mouth.    . fenofibrate 160 MG tablet Take 1 tablet by mouth daily.    . ferrous sulfate 325 (65 FE) MG  tablet Take by mouth.    . folic acid (FOLVITE) 1 MG tablet Take 1 tablet (1 mg total) by mouth daily. 30 tablet 5  . lisinopril-hydrochlorothiazide (PRINZIDE,ZESTORETIC) 20-12.5 MG tablet Take 1 tablet by mouth daily.    . sildenafil (REVATIO) 20 MG tablet Take 20 mg by mouth as needed.    . vitamin B-12 (CYANOCOBALAMIN) 1000 MCG tablet Take by mouth.     No current facility-administered medications for this visit.     OBJECTIVE: There were no vitals filed for this visit.   There is no height or weight on file to calculate BMI.    ECOG FS:0 - Asymptomatic  General: Well-developed, well-nourished, no acute distress. HEENT: Normocephalic. Neuro: Alert, answering all questions appropriately. Cranial nerves grossly intact. Psych: Normal affect.  LAB RESULTS:  Lab Results  Component Value Date   NA 136 10/25/2017   K 3.6 10/25/2017   CL 105 10/25/2017   CO2 25 10/25/2017   GLUCOSE 101 (H) 10/25/2017   BUN 12 10/25/2017   CREATININE 1.06 10/25/2017   CALCIUM 7.9 (L) 10/25/2017   PROT 5.8 (L) 10/25/2017   ALBUMIN 3.2 (L) 10/25/2017   AST 37 10/25/2017   ALT 16 10/25/2017   ALKPHOS 29 (L) 10/25/2017   BILITOT 1.1 10/25/2017   GFRNONAA >60 10/25/2017   GFRAA >60 10/25/2017    Lab Results  Component Value Date   WBC 9.0 01/25/2019   NEUTROABS 5.8 01/25/2019   HGB 9.7 (L) 01/25/2019   HCT 28.0 (L) 01/25/2019   MCV 101.8 (H) 01/25/2019   PLT 312 01/25/2019     STUDIES: No results found.  ASSESSMENT: Anemia, unspecified.  PLAN:   1.  Anemia, unspecified: Patient's hemoglobin has trended down and is 9.7, although has macrocytosis has improved with folate supplementation.  Previously, the remainder of his laboratory work other than a mild M spike on his SPEP of 0.4 was either negative or within normal limits.  Continue folic acid supplementation.  Return to clinic in 3 months with repeat laboratory work and video assisted telemedicine visit.  Will order peripheral blood  extended myeloid panel to assess for underlying MDS with next lab draw.  Patient does not require bone marrow biopsy at this time. 2.  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  Repeat MGUS panel with next lab draw. 3.  Macrocytosis: Improving with folic acid supplementation.  Monitor.    I provided 15 minutes of face-to-face video visit time during this encounter, and > 50% was spent counseling as documented under my assessment & plan.   Lloyd Huger, MD 01/28/2019 4:02 PM

## 2019-01-25 ENCOUNTER — Inpatient Hospital Stay: Payer: BC Managed Care – PPO | Attending: Oncology

## 2019-01-25 ENCOUNTER — Other Ambulatory Visit: Payer: Self-pay

## 2019-01-25 DIAGNOSIS — Z7982 Long term (current) use of aspirin: Secondary | ICD-10-CM | POA: Diagnosis not present

## 2019-01-25 DIAGNOSIS — D472 Monoclonal gammopathy: Secondary | ICD-10-CM | POA: Diagnosis not present

## 2019-01-25 DIAGNOSIS — F17210 Nicotine dependence, cigarettes, uncomplicated: Secondary | ICD-10-CM | POA: Diagnosis not present

## 2019-01-25 DIAGNOSIS — D649 Anemia, unspecified: Secondary | ICD-10-CM | POA: Diagnosis not present

## 2019-01-25 DIAGNOSIS — I10 Essential (primary) hypertension: Secondary | ICD-10-CM | POA: Diagnosis not present

## 2019-01-25 DIAGNOSIS — Z79899 Other long term (current) drug therapy: Secondary | ICD-10-CM | POA: Diagnosis not present

## 2019-01-25 LAB — CBC WITH DIFFERENTIAL/PLATELET
Abs Immature Granulocytes: 0.04 10*3/uL (ref 0.00–0.07)
Basophils Absolute: 0.2 10*3/uL — ABNORMAL HIGH (ref 0.0–0.1)
Basophils Relative: 2 %
Eosinophils Absolute: 0.1 10*3/uL (ref 0.0–0.5)
Eosinophils Relative: 1 %
HCT: 28 % — ABNORMAL LOW (ref 39.0–52.0)
Hemoglobin: 9.7 g/dL — ABNORMAL LOW (ref 13.0–17.0)
Immature Granulocytes: 0 %
Lymphocytes Relative: 21 %
Lymphs Abs: 1.9 10*3/uL (ref 0.7–4.0)
MCH: 35.3 pg — ABNORMAL HIGH (ref 26.0–34.0)
MCHC: 34.6 g/dL (ref 30.0–36.0)
MCV: 101.8 fL — ABNORMAL HIGH (ref 80.0–100.0)
Monocytes Absolute: 0.9 10*3/uL (ref 0.1–1.0)
Monocytes Relative: 10 %
Neutro Abs: 5.8 10*3/uL (ref 1.7–7.7)
Neutrophils Relative %: 66 %
Platelets: 312 10*3/uL (ref 150–400)
RBC: 2.75 MIL/uL — ABNORMAL LOW (ref 4.22–5.81)
RDW: 13 % (ref 11.5–15.5)
WBC: 9 10*3/uL (ref 4.0–10.5)
nRBC: 0 % (ref 0.0–0.2)

## 2019-01-25 LAB — IRON AND TIBC
Iron: 136 ug/dL (ref 45–182)
Saturation Ratios: 46 % — ABNORMAL HIGH (ref 17.9–39.5)
TIBC: 297 ug/dL (ref 250–450)
UIBC: 161 ug/dL

## 2019-01-25 LAB — FERRITIN: Ferritin: 1008 ng/mL — ABNORMAL HIGH (ref 24–336)

## 2019-01-28 ENCOUNTER — Other Ambulatory Visit: Payer: Self-pay

## 2019-01-28 ENCOUNTER — Encounter: Payer: Self-pay | Admitting: Oncology

## 2019-01-28 ENCOUNTER — Inpatient Hospital Stay (HOSPITAL_BASED_OUTPATIENT_CLINIC_OR_DEPARTMENT_OTHER): Payer: BC Managed Care – PPO | Admitting: Oncology

## 2019-01-28 DIAGNOSIS — D649 Anemia, unspecified: Secondary | ICD-10-CM | POA: Diagnosis not present

## 2019-01-28 NOTE — Progress Notes (Signed)
Called patient for Telehealth visit via [place].   Patient c/o some recent feeling of weakness.

## 2019-02-03 ENCOUNTER — Other Ambulatory Visit: Payer: Self-pay | Admitting: Family Medicine

## 2019-02-03 DIAGNOSIS — I10 Essential (primary) hypertension: Secondary | ICD-10-CM

## 2019-02-03 DIAGNOSIS — I712 Thoracic aortic aneurysm, without rupture, unspecified: Secondary | ICD-10-CM

## 2019-02-09 ENCOUNTER — Other Ambulatory Visit: Payer: Self-pay | Admitting: Family Medicine

## 2019-02-16 ENCOUNTER — Other Ambulatory Visit: Payer: BC Managed Care – PPO

## 2019-02-17 DIAGNOSIS — R251 Tremor, unspecified: Secondary | ICD-10-CM | POA: Insufficient documentation

## 2019-02-17 DIAGNOSIS — R29898 Other symptoms and signs involving the musculoskeletal system: Secondary | ICD-10-CM | POA: Insufficient documentation

## 2019-03-09 DIAGNOSIS — R29898 Other symptoms and signs involving the musculoskeletal system: Secondary | ICD-10-CM | POA: Insufficient documentation

## 2019-03-09 DIAGNOSIS — R200 Anesthesia of skin: Secondary | ICD-10-CM | POA: Insufficient documentation

## 2019-03-09 DIAGNOSIS — M542 Cervicalgia: Secondary | ICD-10-CM | POA: Insufficient documentation

## 2019-03-23 DIAGNOSIS — R29818 Other symptoms and signs involving the nervous system: Secondary | ICD-10-CM | POA: Insufficient documentation

## 2019-03-25 ENCOUNTER — Other Ambulatory Visit: Payer: Self-pay

## 2019-03-25 ENCOUNTER — Ambulatory Visit
Admission: RE | Admit: 2019-03-25 | Discharge: 2019-03-25 | Disposition: A | Discharge status: Home / Self Care | Payer: BC Managed Care – PPO | Source: Ambulatory Visit | Attending: Family Medicine | Admitting: Family Medicine

## 2019-03-25 ENCOUNTER — Other Ambulatory Visit
Admission: RE | Admit: 2019-03-25 | Discharge: 2019-03-25 | Disposition: A | Discharge status: Home / Self Care | Payer: BC Managed Care – PPO | Source: Home / Self Care | Attending: Family Medicine | Admitting: Family Medicine

## 2019-03-25 DIAGNOSIS — I712 Thoracic aortic aneurysm, without rupture, unspecified: Secondary | ICD-10-CM

## 2019-03-25 DIAGNOSIS — I10 Essential (primary) hypertension: Secondary | ICD-10-CM

## 2019-03-25 LAB — CREATININE, SERUM
Creatinine, Ser: 0.95 mg/dL (ref 0.61–1.24)
GFR calc Af Amer: >60 mL/min (ref >60)
GFR calc non Af Amer: >60 mL/min (ref >60)

## 2019-03-25 MED ORDER — IOHEXOL 350 MG/ML SOLN
150.0000 mL | Freq: Once | INTRAVENOUS | Status: AC | PRN
  Administered 2019-03-25: 125 mL via INTRAVENOUS

## 2019-04-16 ENCOUNTER — Other Ambulatory Visit: Payer: Self-pay

## 2019-04-16 ENCOUNTER — Ambulatory Visit: Payer: BC Managed Care – PPO | Attending: Neurology | Admitting: Physical Therapy

## 2019-04-16 DIAGNOSIS — R269 Unspecified abnormalities of gait and mobility: Secondary | ICD-10-CM | POA: Insufficient documentation

## 2019-04-16 DIAGNOSIS — M6281 Muscle weakness (generalized): Secondary | ICD-10-CM | POA: Insufficient documentation

## 2019-04-17 NOTE — Therapy (Addendum)
[facility] [address]. [place], [place], [address] Phone: [phone]   Fax:  [phone]  Physical Therapy Evaluation  Patient Details  Name: Alexander Welch MRN: [id number] Date of Birth: [birth year] Referring Provider (PT): Dr. Melrose Nakayama   Encounter Date: 04/16/2019  PT End of Session - 04/17/19 1139    Visit Number  1    Number of Visits  1    Date for PT Re-Evaluation  04/17/19    PT Start Time  0948    PT Stop Time  1153    PT Time Calculation (min)  125 min       Past Medical History:  Diagnosis Date  . Abnormal CT scan, colon 11/27/2017  . Alcohol abuse   . Cardiomyopathy ([place])   . CHF (congestive heart failure) ([place])   . Colon polyps   . ED (erectile dysfunction)   . Hypertension   . Hypertriglyceridemia   . Osteoarthritis   . Osteoarthritis     Past Surgical History:  Procedure Laterality Date  . COLONOSCOPY    . COLONOSCOPY WITH PROPOFOL N/A 02/02/2018   Procedure: COLONOSCOPY WITH PROPOFOL;  Surgeon: Manya Silvas, MD;  Location: [facility] ENDOSCOPY;  Service: Endoscopy;  Laterality: N/A;    There were no vitals filed for this visit.   Subjective Assessment - 04/18/19 1808    Subjective  See FCE    Patient Stated Goals  FCE only    Currently in Pain?  No/denies         [facility] PT Assessment - 04/18/19 0001      Assessment   Medical Diagnosis  Weakness    Referring Provider (PT)  Dr. Melrose Nakayama    Onset Date/Surgical Date  10/06/17    Hand Dominance  Right    Next MD Visit  --   05/04/2019   Prior Therapy  no      Precautions   Precautions  Fall      Balance Screen   Has the patient fallen in the past 6 months  Yes    How many times?  2    Has the patient had a decrease in activity level because of a fear of falling?   Yes      [place] residence    [place] to enter    Entrance Stairs-Number of Steps  2    Entrance Stairs-Rails  None      Prior Function   Level of Independence  Independent    Vocation  On disability    Vocation Requirements  [place] (Moderate to Heavy Duty Job demands      Cognition   Overall Cognitive Status  Within Functional Limits for tasks assessed              Plan - 04/18/19 1808    Clinical Impression Statement  Overall Level of Work: Falls within the Light range.  Exerting up to 20 pounds of force occasionally, and/or up to 10 pounds of force frequently, and/or a negligible amount of force constantly (Constantly: activity or condition exist 2/3 or more of the time) to move objects.  Physical demand requirements are in excess of those for Sedentary Work.  Even though the weight lifted may be only a negligible amount, a job should be rated Light Work: (1) when it requires walking or standing to significant degree; or (2) when it requires sitting most of the time but  entails pushing and/or pulling of arm or leg controls; and/or (3) when the job requires working at a production rate pace entailing the constant pushing and/or pulling of materials even though the weight of those materials is negligible.  NOTE: The constant stress and strain of maintaining a production rate pace, especially in an industrial setting, can be and is physically demanding of a worker even though the amount of force exerted is negligible.    Please see the Task Performance Table for specific abilities.  Tolerance for the 8-Hour Day: Based on the individual task scores in Dynamic Strength, Position Tolerance and Mobility, the client is able to tolerate the Light level of work for the 8-hour day/40-hour week.    Stability/Clinical Decision Making  Evolving/Moderate complexity    Clinical Decision Making  Moderate    Rehab Potential  Fair    PT Frequency  One time visit    PT Treatment/Interventions  ADLs/Self Care Home Management;Gait training;Stair training;Functional mobility training;Neuromuscular re-education;Balance  training;Therapeutic exercise;Therapeutic activities;Patient/family education    PT Next Visit Plan  FCE only.  Report sent to Dr. Lannie Fields office       Patient will benefit from skilled therapeutic intervention in order to improve the following deficits and impairments:  Abnormal gait, Decreased balance, Decreased endurance, Decreased mobility, Difficulty walking, Improper body mechanics, Decreased range of motion, Decreased activity tolerance, Decreased coordination, Decreased strength, Decreased safety awareness, Impaired UE functional use, Postural dysfunction  Visit Diagnosis: Muscle weakness (generalized)  Gait difficulty     Problem List Patient Active Problem List   Diagnosis Date Noted  . Hyponatremia 10/24/2017   Pura Spice, PT, DPT # [phone] 04/18/2019, 6:10 PM  [facility] [address] [place], [place], [address] Phone: [phone]   Fax:  [phone]  Name: Alexander Welch MRN: [id number] Date of Birth: 09-07-1960

## 2019-04-18 ENCOUNTER — Encounter: Payer: Self-pay | Admitting: Physical Therapy

## 2019-04-18 NOTE — Addendum Note (Signed)
Addended by: Pura Spice on: 04/18/2019 06:15 PM   Modules accepted: Orders

## 2019-04-27 ENCOUNTER — Encounter (INDEPENDENT_AMBULATORY_CARE_PROVIDER_SITE_OTHER): Payer: Self-pay | Admitting: Vascular Surgery

## 2019-04-27 ENCOUNTER — Ambulatory Visit (INDEPENDENT_AMBULATORY_CARE_PROVIDER_SITE_OTHER): Payer: BC Managed Care – PPO | Admitting: Vascular Surgery

## 2019-04-27 ENCOUNTER — Other Ambulatory Visit: Payer: Self-pay

## 2019-04-27 VITALS — BP 122/72 | HR 76 | Resp 16 | Ht 67.0 in | Wt 153.2 lb

## 2019-04-27 DIAGNOSIS — I70219 Atherosclerosis of native arteries of extremities with intermittent claudication, unspecified extremity: Secondary | ICD-10-CM | POA: Insufficient documentation

## 2019-04-27 DIAGNOSIS — I70213 Atherosclerosis of native arteries of extremities with intermittent claudication, bilateral legs: Secondary | ICD-10-CM

## 2019-04-27 DIAGNOSIS — F17200 Nicotine dependence, unspecified, uncomplicated: Secondary | ICD-10-CM

## 2019-04-27 DIAGNOSIS — I10 Essential (primary) hypertension: Secondary | ICD-10-CM

## 2019-04-27 DIAGNOSIS — K551 Chronic vascular disorders of intestine: Secondary | ICD-10-CM | POA: Diagnosis not present

## 2019-04-27 DIAGNOSIS — I712 Thoracic aortic aneurysm, without rupture, unspecified: Secondary | ICD-10-CM

## 2019-04-27 NOTE — Assessment & Plan Note (Signed)
There was a 4.2 cm ascending thoracic aortic aneurysm.  There was at least a moderate stenosis of the SMA.  There is also significant aortoiliac calcification with what appeared to be a moderate to high-grade stenosis of the right common femoral artery and proximal superficial femoral artery with separate lesions.  The left femoral bifurcation had some disease although it did not appear severe. The patient does describe symptoms of significant claudication both lower extremities.  He clearly has significant disease even on the limited CT scan.  This does not evaluate his infrainguinal perfusion and I recommended duplex studies and ABIs to get a better feel for the remainder of his perfusion.  I have discussed the pathophysiology and natural history of peripheral arterial disease in some detail.  I discussed the reason and rationale to perform interventions.  I have also discussed with him that common femoral disease is generally best treated with endarterectomy and not percutaneous intervention but I would still begin with an angiogram for evaluation even if we decide that open surgical therapy is eventually required.  The patient voices his understanding.  We discussed the importance of tobacco cessation and this being a primary risk factor for atherosclerosis.  He will return after his noninvasive studies to discuss the results and determine further treatment options.

## 2019-04-27 NOTE — Progress Notes (Signed)
Patient ID: Alexander Welch, male   DOB: 1960-12-17, 59 y.o.   MRN: [id number]  Chief Complaint  Patient presents with  . New Patient (Initial Visit)    ref Inis Sizer abn ct possible femoria blockages    HPI Alexander Welch is a 59 y.o. male.  I am asked to see the patient by Dr. Ellison Hughs for evaluation of significant vascular disease seen on a recent CT scan of the chest abdomen pelvis which I have independently reviewed.  The CT scan had multiple vascular findings.  There was a 4.2 cm ascending thoracic aortic aneurysm.  There was at least a moderate stenosis of the SMA.  There is also significant aortoiliac calcification with what appeared to be a moderate to high-grade stenosis of the right common femoral artery and proximal superficial femoral artery with separate lesions.  The left femoral bifurcation had some disease although it did not appear severe. In questioning the patient, he does not have any chest pain, upper back pain or signs of peripheral embolization letter likely related to the thoracic aneurysm.  He says he is eating well without unintentional weight loss, food fear, or abdominal pain.  He does not have clear signs of chronic visceral ischemia. He does have symptoms of lower extremity peripheral arterial disease.  He describes pain and cramping in his lower legs with walking.  He says the legs get tight and give out on him.  Both lower extremities are affected.  He does not have any open ulcerations or wounds.  He does intermittently have pain that wakes him from sleep and causes him to dangle his legs.  He has multiple atherosclerotic risk factors including ongoing tobacco use.     Past Medical History:  Diagnosis Date  . Abnormal CT scan, colon 11/27/2017  . Alcohol abuse   . Cardiomyopathy ([place])   . CHF (congestive heart failure) ([place])   . Colon polyps   . ED (erectile dysfunction)   . Hypertension   . Hypertriglyceridemia   . Osteoarthritis   . Osteoarthritis      Past Surgical History:  Procedure Laterality Date  . COLONOSCOPY    . COLONOSCOPY WITH PROPOFOL N/A 02/02/2018   Procedure: COLONOSCOPY WITH PROPOFOL;  Surgeon: Manya Silvas, MD;  Location: [facility] ENDOSCOPY;  Service: Endoscopy;  Laterality: N/A;     Family History  Problem Relation Age of Onset  . Heart failure Mother   . Renal Disease Mother   . Bone cancer Father   No bleeding or clotting disorders   Social History   Tobacco Use  . Smoking status: Current Every Day Smoker    Packs/day: 0.50    Years: 40.00    Pack years: 20.00    Types: Cigarettes  . Smokeless tobacco: Never Used  Substance Use Topics  . Alcohol use: Yes    Comment: 1 pint of vodka daily  . Drug use: Yes    Types: Marijuana     No Known Allergies  Current Outpatient Medications  Medication Sig Dispense Refill  . aspirin EC 81 MG tablet Take 1 tablet by mouth daily.    . Cetirizine HCl 10 MG CAPS Take by mouth.    . fenofibrate 160 MG tablet Take 1 tablet by mouth daily.    . ferrous sulfate 325 (65 FE) MG tablet Take by mouth.    . folic acid (FOLVITE) 1 MG tablet Take 1 tablet (1 mg total) by mouth daily. 30 tablet 5  . lisinopril-hydrochlorothiazide (  PRINZIDE,ZESTORETIC) 20-12.5 MG tablet Take 1 tablet by mouth daily.    . sildenafil (REVATIO) 20 MG tablet Take 20 mg by mouth as needed.    . vitamin B-12 (CYANOCOBALAMIN) 1000 MCG tablet Take by mouth.     No current facility-administered medications for this visit.      REVIEW OF SYSTEMS (Negative unless checked)  Constitutional: [] Weight loss  [] Fever  [] Chills Cardiac: [] Chest pain   [] Chest pressure   [] Palpitations   [] Shortness of breath when laying flat   [] Shortness of breath at rest   [] Shortness of breath with exertion. Vascular:  [x] Pain in legs with walking   [x] Pain in legs at rest   [] Pain in legs when laying flat   [x] Claudication   [] Pain in feet when walking  [] Pain in feet at rest  [] Pain in feet when laying flat    [] History of DVT   [] Phlebitis   [] Swelling in legs   [] Varicose veins   [] Non-healing ulcers Pulmonary:   [] Uses home oxygen   [] Productive cough   [] Hemoptysis   [] Wheeze  [] COPD   [] Asthma Neurologic:  [] Dizziness  [] Blackouts   [] Seizures   [] History of stroke   [] History of TIA  [] Aphasia   [] Temporary blindness   [] Dysphagia   [] Weakness or numbness in arms   [] Weakness or numbness in legs Musculoskeletal:  [x] Arthritis   [] Joint swelling   [x] Joint pain   [] Low back pain Hematologic:  [] Easy bruising  [] Easy bleeding   [] Hypercoagulable state   [] Anemic  [] Hepatitis Gastrointestinal:  [] Blood in stool   [] Vomiting blood  [] Gastroesophageal reflux/heartburn   [] Abdominal pain Genitourinary:  [] Chronic kidney disease   [] Difficult urination  [] Frequent urination  [] Burning with urination   [] Hematuria Skin:  [] Rashes   [] Ulcers   [] Wounds Psychological:  [] History of anxiety   []  History of major depression.    Physical Exam BP 122/72 (BP Location: Right Arm)   Pulse 76   Resp 16   Ht 5\' 7"  (1.702 m)   Wt 153 lb 3.2 oz (69.5 kg)   BMI 23.99 kg/m  Gen:  WD/WN, NAD Head: [place]/AT, No temporalis wasting.  Ear/Nose/Throat: Hearing grossly intact, nares w/o erythema or drainage, oropharynx w/o Erythema/Exudate Eyes: Conjunctiva clear, sclera non-icteric  Neck: trachea midline.  No JVD.  Pulmonary:  Good air movement, respirations not labored, no use of accessory muscles  Cardiac: RRR, no JVD Vascular:  Vessel Right Left  Radial Palpable Palpable                          DP 1+ 1+  PT 1+ 2+   Gastrointestinal:. No masses, surgical incisions, or scars. Musculoskeletal: M/S 5/5 throughout.  Extremities without ischemic changes.  No deformity or atrophy. No edema. Neurologic: Sensation grossly intact in extremities.  Symmetrical.  Speech is fluent. Motor exam as listed above. Psychiatric: Judgment intact, Mood & affect appropriate for pt's clinical situation. Dermatologic: No  rashes or ulcers noted.  No cellulitis or open wounds.    Radiology No results found.  Labs Recent Results (from the past 2160 hour(s))  Creatinine, serum     Status: None   Collection Time: 03/25/19  8:07 AM  Result Value Ref Range   Creatinine, Ser 0.95 0.61 - 1.24 mg/dL   GFR calc non Af Amer >60 >60 mL/min   GFR calc Af Amer >60 >60 mL/min    Comment: Performed at [facility], [address], [place],  [place] [address]    Assessment/Plan:  Superior mesenteric artery stenosis (HCC) Seen on CT scan and at least moderate in nature.  Thoracic aortic aneurysm (HCC) 4.2 cm ascending thoracic aortic aneurysm. BP control and tobacco cessation most important to reduce risk of growth and subsequent issues.  Can be checked every year or so with CT scan.   Hypertension blood pressure control important in reducing the progression of atherosclerotic disease. On appropriate oral medications.   Atherosclerosis of native arteries of extremity with intermittent claudication (HCC) There was a 4.2 cm ascending thoracic aortic aneurysm.  There was at least a moderate stenosis of the SMA.  There is also significant aortoiliac calcification with what appeared to be a moderate to high-grade stenosis of the right common femoral artery and proximal superficial femoral artery with separate lesions.  The left femoral bifurcation had some disease although it did not appear severe. The patient does describe symptoms of significant claudication both lower extremities.  He clearly has significant disease even on the limited CT scan.  This does not evaluate his infrainguinal perfusion and I recommended duplex studies and ABIs to get a better feel for the remainder of his perfusion.  I have discussed the pathophysiology and natural history of peripheral arterial disease in some detail.  I discussed the reason and rationale to perform interventions.  I have also discussed with him that common femoral  disease is generally best treated with endarterectomy and not percutaneous intervention but I would still begin with an angiogram for evaluation even if we decide that open surgical therapy is eventually required.  The patient voices his understanding.  We discussed the importance of tobacco cessation and this being a primary risk factor for atherosclerosis.  He will return after his noninvasive studies to discuss the results and determine further treatment options.  Tobacco use disorder We had a discussion for approximately 3 minutes regarding the absolute need for smoking cessation due to the deleterious nature of tobacco on the vascular system. We discussed the tobacco use would diminish patency of any intervention, and likely significantly worsen progressio of disease. We discussed multiple agents for quitting including replacement therapy or medications to reduce cravings such as Chantix. The patient voices their understanding of the importance of smoking cessation.      Leotis Pain 04/27/2019, 4:07 PM   This note was created with Dragon medical transcription system.  Any errors from dictation are unintentional.

## 2019-04-27 NOTE — Assessment & Plan Note (Signed)
4.2 cm ascending thoracic aortic aneurysm. BP control and tobacco cessation most important to reduce risk of growth and subsequent issues.  Can be checked every year or so with CT scan.

## 2019-04-27 NOTE — Assessment & Plan Note (Signed)

## 2019-04-27 NOTE — Assessment & Plan Note (Signed)
Seen on CT scan and at least moderate in nature.

## 2019-04-27 NOTE — Patient Instructions (Signed)
Peripheral Vascular Disease  Peripheral vascular disease (PVD) is a disease of the blood vessels that are not part of your heart and brain. A simple term for PVD is poor circulation. In most cases, PVD narrows the blood vessels that carry blood from your heart to the rest of your body. This can reduce the supply of blood to your arms, legs, and internal organs, like your stomach or kidneys. However, PVD most often affects a person's lower legs and feet. Without treatment, PVD tends to get worse. PVD can also lead to acute ischemic limb. This is when an arm or leg suddenly cannot get enough blood. This is a medical emergency. Follow these instructions at home: Lifestyle  Do not use any products that contain nicotine or tobacco, such as cigarettes and e-cigarettes. If you need help quitting, ask your doctor.  Lose weight if you are overweight. Or, stay at a healthy weight as told by your doctor.  Eat a diet that is low in fat and cholesterol. If you need help, ask your doctor.  Exercise regularly. Ask your doctor for activities that are right for you. General instructions  Take over-the-counter and prescription medicines only as told by your doctor.  Take good care of your feet: ? Wear comfortable shoes that fit well. ? Check your feet often for any cuts or sores.  Keep all follow-up visits as told by your doctor This is important. Contact a doctor if:  You have cramps in your legs when you walk.  You have leg pain when you are at rest.  You have coldness in a leg or foot.  Your skin changes.  You are unable to get or have an erection (erectile dysfunction).  You have cuts or sores on your feet that do not heal. Get help right away if:  Your arm or leg turns cold, numb, and blue.  Your arms or legs become red, warm, swollen, painful, or numb.  You have chest pain.  You have trouble breathing.  You suddenly have weakness in your face, arm, or leg.  You become very  confused or you cannot speak.  You suddenly have a very bad headache.  You suddenly cannot see. Summary  Peripheral vascular disease (PVD) is a disease of the blood vessels.  A simple term for PVD is poor circulation. Without treatment, PVD tends to get worse.  Treatment may include exercise, low fat and low cholesterol diet, and quitting smoking. This information is not intended to replace advice given to you by your health care provider. Make sure you discuss any questions you have with your health care provider. Document Revised: 03/07/2017 Document Reviewed: 05/02/2016 Elsevier Patient Education  2020 [employer]  

## 2019-04-27 NOTE — Assessment & Plan Note (Signed)
blood pressure control important in reducing the progression of atherosclerotic disease. On appropriate oral medications.  

## 2019-04-29 ENCOUNTER — Inpatient Hospital Stay: Payer: BC Managed Care – PPO | Attending: Oncology

## 2019-05-01 NOTE — Progress Notes (Signed)
  [place]  [phone] [phone]   Lloyd Huger, MD 05/01/2019 11:22 AM     This encounter was created in error - please disregard.

## 2019-05-05 ENCOUNTER — Encounter: Payer: Self-pay | Admitting: Oncology

## 2019-05-06 ENCOUNTER — Inpatient Hospital Stay: Payer: BC Managed Care – PPO | Admitting: Oncology

## 2019-05-12 ENCOUNTER — Encounter (INDEPENDENT_AMBULATORY_CARE_PROVIDER_SITE_OTHER): Payer: BC Managed Care – PPO

## 2019-05-12 ENCOUNTER — Ambulatory Visit (INDEPENDENT_AMBULATORY_CARE_PROVIDER_SITE_OTHER): Payer: BC Managed Care – PPO | Admitting: Nurse Practitioner

## 2019-05-13 DIAGNOSIS — R634 Abnormal weight loss: Secondary | ICD-10-CM | POA: Insufficient documentation

## 2019-05-13 DIAGNOSIS — M79602 Pain in left arm: Secondary | ICD-10-CM | POA: Insufficient documentation

## 2019-05-13 DIAGNOSIS — R0609 Other forms of dyspnea: Secondary | ICD-10-CM | POA: Insufficient documentation

## 2019-05-13 DIAGNOSIS — I208 Other forms of angina pectoris: Secondary | ICD-10-CM | POA: Insufficient documentation

## 2019-05-19 ENCOUNTER — Ambulatory Visit (INDEPENDENT_AMBULATORY_CARE_PROVIDER_SITE_OTHER): Payer: BC Managed Care – PPO

## 2019-05-19 ENCOUNTER — Encounter (INDEPENDENT_AMBULATORY_CARE_PROVIDER_SITE_OTHER): Payer: Self-pay | Admitting: Nurse Practitioner

## 2019-05-19 ENCOUNTER — Other Ambulatory Visit: Payer: Self-pay

## 2019-05-19 ENCOUNTER — Ambulatory Visit (INDEPENDENT_AMBULATORY_CARE_PROVIDER_SITE_OTHER): Payer: BC Managed Care – PPO | Admitting: Nurse Practitioner

## 2019-05-19 VITALS — BP 106/69 | HR 73 | Resp 17 | Ht 67.0 in | Wt 154.0 lb

## 2019-05-19 DIAGNOSIS — I10 Essential (primary) hypertension: Secondary | ICD-10-CM

## 2019-05-19 DIAGNOSIS — I70213 Atherosclerosis of native arteries of extremities with intermittent claudication, bilateral legs: Secondary | ICD-10-CM

## 2019-05-19 DIAGNOSIS — F17200 Nicotine dependence, unspecified, uncomplicated: Secondary | ICD-10-CM

## 2019-05-21 ENCOUNTER — Encounter (INDEPENDENT_AMBULATORY_CARE_PROVIDER_SITE_OTHER): Payer: Self-pay | Admitting: Nurse Practitioner

## 2019-05-21 NOTE — Progress Notes (Signed)
SUBJECTIVE:  Patient ID: Alexander Welch, male    DOB: December 14, 1960, 59 y.o.   MRN: [id number] Chief Complaint  Patient presents with  . Follow-up    ultrasound    HPI  Alexander Welch is a 59 y.o. male presents today to our office for follow-up studies.  The patient previously had a recent CT scan of his chest and abdomen which was found to have an ascending thoracic aortic aneurysm in addition to some moderate stenosis of the SMA.  There was also significant aortoiliac calcification which appear to be a moderate to high-grade stenosis of the right common femoral artery with proximal femoral artery disease and separate lesions.  The left femoral bifurcation also has some disease however it did not appear to be as severe.  The patient continues to deny any signs symptoms of peripheral embolization.  He does however have symptoms of lower extremity peripheral arterial disease.  He has pain and cramping in both of his lower legs with walking.  He is not able to walk for long distances before he begins to claudicate and has to stop.  His bilateral lower extremities are affected equally.  He denies any open wounds or ulcerations.  He states that his pain does wake him up at night from sleep and it also causes him to dangle his legs at times.    The patient has an ABI of 1.20 on the right and 1.21 on the left.  The patient also underwent bilateral lower extremity arterial duplexes which revealed triphasic waveforms throughout the bilateral lower extremities.  Past Medical History:  Diagnosis Date  . Abnormal CT scan, colon 11/27/2017  . Alcohol abuse   . Cardiomyopathy ([place])   . CHF (congestive heart failure) ([place])   . Colon polyps   . ED (erectile dysfunction)   . Hypertension   . Hypertriglyceridemia   . Osteoarthritis   . Osteoarthritis     Past Surgical History:  Procedure Laterality Date  . COLONOSCOPY    . COLONOSCOPY WITH PROPOFOL N/A 02/02/2018   Procedure: COLONOSCOPY WITH  PROPOFOL;  Surgeon: Manya Silvas, MD;  Location: [facility] ENDOSCOPY;  Service: Endoscopy;  Laterality: N/A;    Social History   Socioeconomic History  . Marital status: Married    Spouse name: Not on file  . Number of children: Not on file  . Years of education: Not on file  . Highest education level: Not on file  Occupational History  . Not on file  Tobacco Use  . Smoking status: Current Every Day Smoker    Packs/day: 0.50    Years: 40.00    Pack years: 20.00    Types: Cigarettes  . Smokeless tobacco: Never Used  Substance and Sexual Activity  . Alcohol use: Yes    Comment: 1 pint of vodka daily  . Drug use: Yes    Types: Marijuana  . Sexual activity: Yes  Other Topics Concern  . Not on file  Social History Narrative  . Not on file   Social Determinants of Health   Financial Resource Strain:   . Difficulty of Paying Living Expenses: Not on file  Food Insecurity:   . Worried About Charity fundraiser in the Last Year: Not on file  . Ran Out of Food in the Last Year: Not on file  Transportation Needs:   . Lack of Transportation (Medical): Not on file  . Lack of Transportation (Non-Medical): Not on file  Physical Activity:   .  Days of Exercise per Week: Not on file  . Minutes of Exercise per Session: Not on file  Stress:   . Feeling of Stress : Not on file  Social Connections:   . Frequency of Communication with Friends and Family: Not on file  . Frequency of Social Gatherings with Friends and Family: Not on file  . Attends Religious Services: Not on file  . Active Member of Clubs or Organizations: Not on file  . Attends Archivist Meetings: Not on file  . Marital Status: Not on file  Intimate Partner Violence:   . Fear of Current or Ex-Partner: Not on file  . Emotionally Abused: Not on file  . Physically Abused: Not on file  . Sexually Abused: Not on file    Family History  Problem Relation Age of Onset  . Heart failure Mother   . Renal Disease  Mother   . Bone cancer Father     No Known Allergies   Review of Systems   Review of Systems: Negative Unless Checked Constitutional: [] Weight loss  [] Fever  [] Chills Cardiac: [] Chest pain   []  Atrial Fibrillation  [] Palpitations   [] Shortness of breath when laying flat   [] Shortness of breath with exertion. [] Shortness of breath at rest Vascular:  [] Pain in legs with walking   [] Pain in legs with standing [] Pain in legs when laying flat   [x] Claudication    [] Pain in feet when laying flat    [] History of DVT   [] Phlebitis   [] Swelling in legs   [] Varicose veins   [] Non-healing ulcers Pulmonary:   [] Uses home oxygen   [] Productive cough   [] Hemoptysis   [] Wheeze  [] COPD   [] Asthma Neurologic:  [] Dizziness   [] Seizures  [] Blackouts [] History of stroke   [] History of TIA  [] Aphasia   [] Temporary Blindness   [x] Weakness or numbness in arm   [x] Weakness or numbness in leg Musculoskeletal:   [] Joint swelling   [] Joint pain   [] Low back pain  []  History of Knee Replacement [] Arthritis [] back Surgeries  []  Spinal Stenosis    Hematologic:  [] Easy bruising  [] Easy bleeding   [] Hypercoagulable state   [] Anemic Gastrointestinal:  [] Diarrhea   [] Vomiting  [] Gastroesophageal reflux/heartburn   [] Difficulty swallowing. [] Abdominal pain Genitourinary:  [] Chronic kidney disease   [] Difficult urination  [] Anuric   [] Blood in urine [] Frequent urination  [] Burning with urination   [] Hematuria Skin:  [] Rashes   [] Ulcers [] Wounds Psychological:  [] History of anxiety   []  History of major depression  []  Memory Difficulties      OBJECTIVE:   Physical Exam  BP 106/69 (BP Location: Right Arm)   Pulse 73   Resp 17   Ht 5\' 7"  (1.702 m)   Wt 154 lb (69.9 kg)   BMI 24.12 kg/m   Gen: WD/WN, NAD Head: [place]/AT, No temporalis wasting.  Ear/Nose/Throat: Hearing grossly intact, nares w/o erythema or drainage Eyes: PER, EOMI, sclera nonicteric.  Neck: Supple, no masses.  No JVD.  Pulmonary:  Good air movement, no  use of accessory muscles.  Cardiac: RRR Vascular:  Vessel Right Left  Dorsalis Pedis Palpable Palpable  Posterior Tibial Palpable Palpable   Gastrointestinal: soft, non-distended. No guarding/no peritoneal signs.  Musculoskeletal: M/S 5/5 throughout.  No deformity or atrophy.  Neurologic: Pain and light touch intact in extremities.  Symmetrical.  Speech is fluent. Motor exam as listed above. Psychiatric: Judgment intact, Mood & affect appropriate for pt's clinical situation.        ASSESSMENT  AND PLAN:  1. Atherosclerosis of native artery of both lower extremities with intermittent claudication (HCC) Based upon the patient's CT scan in addition to his symptoms, it is possible that he has iliac level disease.  In addition to his atherosclerotic risk factors, proceeding with an angiogram is a reasonable option.  As previously discussed with Dr. Lucky Cowboy, femoral disease may necessitate further surgical intervention however an angiogram is needed for planning any future interventions.  Recommend:  The patient has experienced increased symptoms and is now describing lifestyle limiting claudication and mild rest pain.   Given the severity of the patient's lower extremity symptoms the patient should undergo angiography and intervention.  Risk and benefits were reviewed the patient.  Indications for the procedure were reviewed.  All questions were answered, the patient agrees to proceed.   The patient should continue walking and begin a more formal exercise program.  The patient should continue antiplatelet therapy and aggressive treatment of the lipid abnormalities  The patient will follow up with me after the angiogram.   2. Tobacco use disorder Smoking cessation was discussed, 3-10 minutes spent on this topic specifically   3. Essential hypertension Blood pressure well controlled today.  Patient on appropriate medications.  No changes needed.   Current Outpatient Medications on File  Prior to Visit  Medication Sig Dispense Refill  . aspirin EC 81 MG tablet Take 1 tablet by mouth daily.    . carvedilol (COREG) 3.125 MG tablet Take by mouth.    . Cetirizine HCl 10 MG CAPS Take by mouth.    . fenofibrate 160 MG tablet Take 1 tablet by mouth daily.    . ferrous sulfate 325 (65 FE) MG tablet Take by mouth.    . folic acid (FOLVITE) 1 MG tablet Take 1 tablet (1 mg total) by mouth daily. 30 tablet 5  . lisinopril-hydrochlorothiazide (PRINZIDE,ZESTORETIC) 20-12.5 MG tablet Take 1 tablet by mouth daily.    . sildenafil (REVATIO) 20 MG tablet Take 20 mg by mouth as needed.    Marland Kitchen spironolactone (ALDACTONE) 25 MG tablet Take by mouth.    . vitamin B-12 (CYANOCOBALAMIN) 1000 MCG tablet Take by mouth.     No current facility-administered medications on file prior to visit.    There are no Patient Instructions on file for this visit. No follow-ups on file.   Kris Hartmann, NP  This note was completed with Sales executive.  Any errors are purely unintentional.

## 2019-05-24 DIAGNOSIS — G479 Sleep disorder, unspecified: Secondary | ICD-10-CM | POA: Insufficient documentation

## 2019-06-06 ENCOUNTER — Emergency Department
Admission: EM | Admit: 2019-06-06 | Discharge: 2019-06-07 | Disposition: A | Discharge status: Home / Self Care | Payer: BC Managed Care – PPO | Attending: Emergency Medicine | Admitting: Emergency Medicine

## 2019-06-06 ENCOUNTER — Other Ambulatory Visit: Payer: Self-pay

## 2019-06-06 ENCOUNTER — Emergency Department: Payer: BC Managed Care – PPO

## 2019-06-06 DIAGNOSIS — W01198A Fall on same level from slipping, tripping and stumbling with subsequent striking against other object, initial encounter: Secondary | ICD-10-CM | POA: Diagnosis not present

## 2019-06-06 DIAGNOSIS — F17210 Nicotine dependence, cigarettes, uncomplicated: Secondary | ICD-10-CM | POA: Diagnosis not present

## 2019-06-06 DIAGNOSIS — Y908 Blood alcohol level of 240 mg/100 ml or more: Secondary | ICD-10-CM | POA: Diagnosis not present

## 2019-06-06 DIAGNOSIS — S0081XA Abrasion of other part of head, initial encounter: Secondary | ICD-10-CM | POA: Insufficient documentation

## 2019-06-06 DIAGNOSIS — I10 Essential (primary) hypertension: Secondary | ICD-10-CM | POA: Diagnosis not present

## 2019-06-06 DIAGNOSIS — F10920 Alcohol use, unspecified with intoxication, uncomplicated: Secondary | ICD-10-CM | POA: Diagnosis not present

## 2019-06-06 DIAGNOSIS — F1210 Cannabis abuse, uncomplicated: Secondary | ICD-10-CM | POA: Diagnosis not present

## 2019-06-06 DIAGNOSIS — Z7982 Long term (current) use of aspirin: Secondary | ICD-10-CM | POA: Diagnosis not present

## 2019-06-06 DIAGNOSIS — Z79899 Other long term (current) drug therapy: Secondary | ICD-10-CM | POA: Insufficient documentation

## 2019-06-06 DIAGNOSIS — E876 Hypokalemia: Secondary | ICD-10-CM | POA: Diagnosis not present

## 2019-06-06 DIAGNOSIS — Y998 Other external cause status: Secondary | ICD-10-CM | POA: Diagnosis not present

## 2019-06-06 DIAGNOSIS — Y9389 Activity, other specified: Secondary | ICD-10-CM | POA: Diagnosis not present

## 2019-06-06 DIAGNOSIS — S0990XA Unspecified injury of head, initial encounter: Secondary | ICD-10-CM | POA: Diagnosis present

## 2019-06-06 DIAGNOSIS — Y929 Unspecified place or not applicable: Secondary | ICD-10-CM | POA: Diagnosis not present

## 2019-06-06 LAB — ETHANOL: Alcohol, Ethyl (B): 315 mg/dL (ref <10)

## 2019-06-06 LAB — BASIC METABOLIC PANEL
Anion gap: 16 — ABNORMAL HIGH (ref 5–15)
BUN: <5 mg/dL — ABNORMAL LOW (ref 6–20)
CO2: 27 mmol/L (ref 22–32)
Calcium: 8.7 mg/dL — ABNORMAL LOW (ref 8.9–10.3)
Chloride: 92 mmol/L — ABNORMAL LOW (ref 98–111)
Creatinine, Ser: 0.79 mg/dL (ref 0.61–1.24)
GFR calc Af Amer: >60 mL/min (ref >60)
GFR calc non Af Amer: >60 mL/min (ref >60)
Glucose, Bld: 97 mg/dL (ref 70–99)
Potassium: 2.9 mmol/L — ABNORMAL LOW (ref 3.5–5.1)
Sodium: 135 mmol/L (ref 135–145)

## 2019-06-06 LAB — CBC
HCT: 37 % — ABNORMAL LOW (ref 39.0–52.0)
Hemoglobin: 13 g/dL (ref 13.0–17.0)
MCH: 33.3 pg (ref 26.0–34.0)
MCHC: 35.1 g/dL (ref 30.0–36.0)
MCV: 94.9 fL (ref 80.0–100.0)
Platelets: 208 10*3/uL (ref 150–400)
RBC: 3.9 MIL/uL — ABNORMAL LOW (ref 4.22–5.81)
RDW: 13.3 % (ref 11.5–15.5)
WBC: 6.2 10*3/uL (ref 4.0–10.5)
nRBC: 0 % (ref 0.0–0.2)

## 2019-06-06 MED ORDER — POTASSIUM CHLORIDE CRYS ER 20 MEQ PO TBCR
40.0000 meq | EXTENDED_RELEASE_TABLET | Freq: Once | ORAL | Status: AC
  Administered 2019-06-07: 40 meq via ORAL
  Filled 2019-06-06: qty 2

## 2019-06-06 MED ORDER — SODIUM CHLORIDE 0.9 % IV BOLUS
1000.0000 mL | Freq: Once | INTRAVENOUS | Status: AC
  Administered 2019-06-07: 1000 mL via INTRAVENOUS

## 2019-06-06 NOTE — ED Triage Notes (Signed)
See first nurse note. 

## 2019-06-06 NOTE — ED Notes (Signed)
Patient to waiting room via wheelchair by EMS.  Per EMS patient complains of fall.  Patient with laceration above right eye.  Reports weight loss, drinking alcohol more over the past few months.  Vs hr 112; bp 118/178, pulse oxi 96% on room air.

## 2019-06-06 NOTE — ED Notes (Addendum)
Pt to room 17 with this RN; lac with controlled bleeding (dried blood) to right temple, and mud on knees and pt wearing blue scrub top  Pt reports taking the trash out and fell and then wife and daughter were concerned so EMS was called to pt's house  Pt reports daily pint of vodka drinker and tonight drank 2 pints, daily 1 ppd smoker  No pain to palpation on head and shoulders, and knees, pt denies LOC

## 2019-06-06 NOTE — ED Provider Notes (Signed)
[facility] Emergency Department Provider Note   ____________________________________________   First MD Initiated Contact with Patient 06/06/19 2313     (approximate)  I have reviewed the triage vital signs and the nursing notes.   HISTORY  Chief Complaint Weakness    HPI Alexander Welch is a 59 y.o. male brought to the ED via EMS from home status post mechanical fall.  Patient with EtOH on board who stumbled outside and fell, striking his right face.  Denies LOC. Denies vision changes, neck pain, chest pain, shortness breath, abdominal pain, nausea or vomiting.       Past Medical History:  Diagnosis Date  . Abnormal CT scan, colon 11/27/2017  . Alcohol abuse   . Cardiomyopathy ([place])   . CHF (congestive heart failure) ([place])   . Colon polyps   . ED (erectile dysfunction)   . Hypertension   . Hypertriglyceridemia   . Osteoarthritis   . Osteoarthritis     Patient Active Problem List   Diagnosis Date Noted  . Angina at rest [facility]) 05/13/2019  . Dyspnea on exertion 05/13/2019  . Excessive weight loss 05/13/2019  . Left arm pain 05/13/2019  . Superior mesenteric artery stenosis ([place]) 04/27/2019  . Tobacco use disorder 04/27/2019  . Thoracic aortic aneurysm ([place]) 04/27/2019  . Atherosclerosis of native arteries of extremity with intermittent claudication ([place]) 04/27/2019  . Difficulty balancing 03/23/2019  . Bilateral arm weakness 03/09/2019  . Bilateral hand numbness 03/09/2019  . Neck pain 03/09/2019  . Other symptoms and signs involving the musculoskeletal system 02/17/2019  . Tremor 02/17/2019  . Abnormal CT scan, colon 11/27/2017  . Alcoholism ([place]) 11/27/2017  . History of colon polyps 11/27/2017  . Low serum alkaline phosphatase 11/27/2017  . Hyponatremia 10/24/2017  . Primary osteoarthritis of left knee 07/24/2017  . Cardiomyopathy ([place]) 12/08/2013  . ED (erectile dysfunction) 12/08/2013  . Hypertension 12/08/2013  .  Hypertriglyceridemia 12/08/2013    Past Surgical History:  Procedure Laterality Date  . COLONOSCOPY    . COLONOSCOPY WITH PROPOFOL N/A 02/02/2018   Procedure: COLONOSCOPY WITH PROPOFOL;  Surgeon: Manya Silvas, MD;  Location: [facility] ENDOSCOPY;  Service: Endoscopy;  Laterality: N/A;    Prior to Admission medications   Medication Sig Start Date End Date Taking? Authorizing Provider  aspirin EC 81 MG tablet Take 1 tablet by mouth daily.    [provider]  carvedilol (COREG) 3.125 MG tablet Take by mouth. 05/11/19 05/10/20  [provider]  Cetirizine HCl 10 MG CAPS Take by mouth.    [provider]  fenofibrate 160 MG tablet Take 1 tablet by mouth daily. 01/09/17   [provider]  ferrous sulfate 325 (65 FE) MG tablet Take by mouth.    [provider]  folic acid (FOLVITE) 1 MG tablet Take 1 tablet (1 mg total) by mouth daily. 10/13/18   Lloyd Huger, MD  lisinopril-hydrochlorothiazide (PRINZIDE,ZESTORETIC) 20-12.5 MG tablet Take 1 tablet by mouth daily. 01/09/17   [provider]  sildenafil (REVATIO) 20 MG tablet Take 20 mg by mouth as needed.    [provider]  spironolactone (ALDACTONE) 25 MG tablet Take by mouth. 05/11/19   [provider]  vitamin B-12 (CYANOCOBALAMIN) 1000 MCG tablet Take by mouth.    [provider]    Allergies Patient has no known allergies.  Family History  Problem Relation Age of Onset  . Heart failure Mother   . Renal Disease Mother   . Bone  cancer Father     Social History Social History   Tobacco Use  . Smoking status: Current Every Day Smoker    Packs/day: 0.50    Years: 40.00    Pack years: 20.00    Types: Cigarettes  . Smokeless tobacco: Never Used  Substance Use Topics  . Alcohol use: Yes    Comment: 1 pint of vodka daily  . Drug use: Yes    Types: Marijuana    Review of Systems  Constitutional: Positive for EtOH.  No fever/chills Eyes: No visual  changes. ENT: Positive for facial abrasion.  No sore throat. Cardiovascular: Denies chest pain. Respiratory: Denies shortness of breath. Gastrointestinal: No abdominal pain.  No nausea, no vomiting.  No diarrhea.  No constipation. Genitourinary: Negative for dysuria. Musculoskeletal: Negative for back pain. Skin: Negative for rash. Neurological: Negative for headaches, focal weakness or numbness.   ____________________________________________   PHYSICAL EXAM:  VITAL SIGNS: ED Triage Vitals  Enc Vitals Group     BP 06/06/19 2124 124/77     Pulse Rate 06/06/19 2124 83     Resp 06/06/19 2124 16     Temp 06/06/19 2124 98.7 F (37.1 C)     Temp Source 06/06/19 2124 Oral     SpO2 06/06/19 2124 98 %     Weight 06/06/19 2124 150 lb (68 kg)     Height 06/06/19 2124 5\' 7"  (1.702 m)     Head Circumference --      Peak Flow --      Pain Score 06/06/19 2128 6     Pain Loc --      Pain Edu? --      Excl. in [place]? --     Constitutional: Alert and oriented. Well appearing and in no acute distress.  Intoxicated. Eyes: Conjunctivae are normal. PERRL. EOMI. Head: Minor abrasions to right temple. Nose: Atraumatic. Mouth/Throat: Mucous membranes are moist.  No dental malocclusion. Neck: No stridor.  No cervical spine tenderness to palpation. Cardiovascular: Normal rate, regular rhythm. Grossly normal heart sounds.  Good peripheral circulation. Respiratory: Normal respiratory effort.  No retractions. Lungs CTAB. Gastrointestinal: Soft and nontender to light or deep palpation. No distention. No abdominal bruits. No CVA tenderness. Musculoskeletal: No lower extremity tenderness nor edema.  No joint effusions. Neurologic: Alert and oriented x3.  CN II-X II grossly intact.  Normal speech and language. No gross focal neurologic deficits are appreciated.  MAE x4.  Skin:  Skin is warm, dry and intact. No rash noted. Psychiatric: Mood and affect are normal. Speech and behavior are  normal.  ____________________________________________   LABS (all labs ordered are listed, but only abnormal results are displayed)  Labs Reviewed  BASIC METABOLIC PANEL - Abnormal; Notable for the following components:      Result Value   Potassium 2.9 (*)    Chloride 92 (*)    BUN <5 (*)    Calcium 8.7 (*)    Anion gap 16 (*)    All other components within normal limits  CBC - Abnormal; Notable for the following components:   RBC 3.90 (*)    HCT 37.0 (*)    All other components within normal limits  ETHANOL - Abnormal; Notable for the following components:   Alcohol, Ethyl (B) 315 (*)    All other components within normal limits  URINALYSIS, COMPLETE (UACMP) WITH MICROSCOPIC  CBG MONITORING, ED   ____________________________________________  EKG  ED ECG REPORT I, Miyoshi Ligas J, the attending physician, personally viewed and  interpreted this ECG.   Date: 06/06/2019  EKG Time: 2134  Rate: 88  Rhythm: normal EKG, normal sinus rhythm  Axis: Normal  Intervals:none  ST&T Change: Nonspecific  ____________________________________________  RADIOLOGY  ED MD interpretation: No ICH  Official radiology report(s): CT Head Wo Contrast  Result Date: 06/06/2019 CLINICAL DATA:  Recent fall with facial laceration, initial encounter EXAM: CT HEAD WITHOUT CONTRAST TECHNIQUE: Contiguous axial images were obtained from the base of the skull through the vertex without intravenous contrast. COMPARISON:  None. FINDINGS: Brain: No evidence of acute infarction, hemorrhage, hydrocephalus, extra-axial collection or mass lesion/mass effect. Mild atrophic changes are noted commensurate with the patient's given age. Bilateral basal ganglia calcifications are seen. Vascular: No hyperdense vessel or unexpected calcification. Skull: Normal. Negative for fracture or focal lesion. Sinuses/Orbits: No acute finding. Other: None. IMPRESSION: Atrophic changes without acute abnormality. Electronically Signed    By: Inez Catalina M.D.   On: 06/06/2019 23:56    ____________________________________________   PROCEDURES  Procedure(s) performed (including Critical Care):  Procedures   ____________________________________________   INITIAL IMPRESSION / ASSESSMENT AND PLAN / ED COURSE  As part of my medical decision making, I reviewed the following data within the [place] notes reviewed and incorporated, Labs reviewed, EKG interpreted, Old chart reviewed, Radiograph reviewed and Notes from prior ED visits     ANSIL STRZALKA was evaluated in Emergency Department on 06/07/2019 for the symptoms described in the history of present illness. He was evaluated in the context of the global COVID-19 pandemic, which necessitated consideration that the patient might be at risk for infection with the SARS-CoV-2 virus that causes COVID-19. Institutional protocols and algorithms that pertain to the evaluation of patients at risk for COVID-19 are in a state of rapid change based on information released by regulatory bodies including the CDC and federal and state organizations. These policies and algorithms were followed during the patient's care in the ED.    59 year old male who presents s/p mechanical fall with facial abrasions.  Differential diagnosis includes but is not limited to [place], SDH, abrasions, contusions, etc.  Laboratory results notable for hypokalemia and elevated EtOH level.  Initiate IV fluid resuscitation, CT head, observe in the ED until sobriety.   Clinical Course as of Jun 07 111  Mon Jun 07, 2019  0111 Updated patient on CT head result.  He is ambulatory with steady gait.  Wife will pick him up.  Strict return precautions given.  Patient verbalizes understanding and agrees with plan of care.   [JS]    Clinical Course User Index [JS] Paulette Blanch, MD     ____________________________________________   FINAL CLINICAL IMPRESSION(S) / ED DIAGNOSES  Final  diagnoses:  Alcoholic intoxication without complication ([place])  Facial abrasion, initial encounter  Hypokalemia     ED Discharge Orders    None       Note:  This document was prepared using Dragon voice recognition software and may include unintentional dictation errors.   Paulette Blanch, MD 06/07/19 [phone]

## 2019-06-07 ENCOUNTER — Ambulatory Visit: Payer: BC Managed Care – PPO | Attending: Neurology | Admitting: Physical Therapy

## 2019-06-07 DIAGNOSIS — R29818 Other symptoms and signs involving the nervous system: Secondary | ICD-10-CM

## 2019-06-07 DIAGNOSIS — M6281 Muscle weakness (generalized): Secondary | ICD-10-CM | POA: Diagnosis not present

## 2019-06-07 DIAGNOSIS — R269 Unspecified abnormalities of gait and mobility: Secondary | ICD-10-CM | POA: Diagnosis present

## 2019-06-07 NOTE — ED Notes (Signed)
Pt wife called and updated with pt; coming to pick up; pt wheeled to lobby  Peripheral IV discontinued. Catheter intact. No signs of infiltration or redness. Gauze applied to IV site.    Discharge instructions reviewed with patient. Questions fielded by this RN. Patient verbalizes understanding of instructions. Patient discharged home in stable condition per Dr Beather Arbour . No acute distress noted at time of discharge.

## 2019-06-07 NOTE — ED Notes (Signed)
Call from wife, phone to pt's room - back from CT - family updated with pt

## 2019-06-07 NOTE — Discharge Instructions (Addendum)
Drink alcohol only in moderation.  Return to the ER for worsening symptoms, persistent vomiting, difficulty breathing, lethargy or other concerns. °

## 2019-06-07 NOTE — ED Notes (Signed)
Pt ambulatory in hallway approx 50 feet without difficulty; EDP aware

## 2019-06-09 ENCOUNTER — Ambulatory Visit: Payer: BC Managed Care – PPO | Admitting: Physical Therapy

## 2019-06-09 ENCOUNTER — Other Ambulatory Visit: Payer: Self-pay

## 2019-06-09 ENCOUNTER — Encounter (INDEPENDENT_AMBULATORY_CARE_PROVIDER_SITE_OTHER): Payer: Self-pay | Admitting: Nurse Practitioner

## 2019-06-09 DIAGNOSIS — M6281 Muscle weakness (generalized): Secondary | ICD-10-CM

## 2019-06-09 DIAGNOSIS — R269 Unspecified abnormalities of gait and mobility: Secondary | ICD-10-CM

## 2019-06-09 DIAGNOSIS — R29818 Other symptoms and signs involving the nervous system: Secondary | ICD-10-CM

## 2019-06-12 ENCOUNTER — Encounter: Payer: Self-pay | Admitting: Physical Therapy

## 2019-06-12 NOTE — Therapy (Signed)
[facility] [address]. [place], [place], [address] Phone: [phone]   Fax:  [phone]  Physical Therapy Treatment  Patient Details  Name: Alexander Welch MRN: [id number] Date of Birth: [birth year] Referring Provider (PT): Dr. Melrose Nakayama   Encounter Date: 06/09/2019  PT End of Session - 06/12/19 1741    Visit Number  2    Number of Visits  8    Date for PT Re-Evaluation  07/05/19    Authorization - Visit Number  2    Authorization - Number of Visits  10    PT Start Time  [id number]    PT Stop Time  1514    PT Time Calculation (min)  52 min    Equipment Utilized During Treatment  Gait belt    Activity Tolerance  Patient tolerated treatment well    Behavior During Therapy  [facility] for tasks assessed/performed       Past Medical History:  Diagnosis Date  . Abnormal CT scan, colon 11/27/2017  . Alcohol abuse   . Cardiomyopathy ([place])   . CHF (congestive heart failure) ([place])   . Colon polyps   . ED (erectile dysfunction)   . Hypertension   . Hypertriglyceridemia   . Osteoarthritis   . Osteoarthritis     Past Surgical History:  Procedure Laterality Date  . COLONOSCOPY    . COLONOSCOPY WITH PROPOFOL N/A 02/02/2018   Procedure: COLONOSCOPY WITH PROPOFOL;  Surgeon: Manya Silvas, MD;  Location: [facility] ENDOSCOPY;  Service: Endoscopy;  Laterality: N/A;    There were no vitals filed for this visit.  Subjective Assessment - 06/12/19 1738    Subjective  Pt. reports no pain but numbness in B feet.  Pt. states nothing seems to help numbness in feet. Pt. brought in [facility].  No falls since initial evaluation.    Limitations  Lifting;Standing;Walking    Patient Stated Goals  Improve LE muscle strength/ balance to improve independence with gait.    Currently in Pain?  No/denies          There.ex.:  Nustep L5 10 min. B UE/LE (consistent cadence) Standing hip marching/ lateral walking/ partial squats at //-bars with mirror  feedback Reviewed HEP  Neuro.mm.:  Standing cone touches (heel taps)- no UE assist and cuing to correct posture Resisted gait 1BTB 5x all 4-planes (min. To no UE assist in //-bars) Airex: step ups/ tandem stance/ wt. Shifting/ step downs Walking in hallway with cone taps/ alt. UE and LE/ head turning Walking outside on grassy/ down ramps/ curbs with and without use of SPC.      PT Long Term Goals - 06/12/19 1724      PT LONG TERM GOAL #1   Title  Pt. will increase FOTO score to 75 to improve functional mobility.    Baseline  initial FOTO: 49    Time  4    Period  Weeks    Status  New    Target Date  07/05/19      PT LONG TERM GOAL #2   Title  Pt. will increase Berg balance score to >50/56 to decrease fall risk/ improve independence with gait.    Baseline  Initial Berg: 46/56    Time  4    Period  Weeks    Status  New    Target Date  07/05/19      PT LONG TERM GOAL #3   Title  Pt. will ambulate with mod. independence and more  normalized gait pattern on outside/grassy surfaces with no LOB.    Baseline  H/o falls on outside terrain/ occasional shuffling gait/ decrease hip flexion/ step length.    Time  4    Period  Weeks    Status  New    Target Date  07/05/19      PT LONG TERM GOAL #4   Title  Pt. able to don/doff socks and shoes independently.    Baseline  assist to don/doff socks and shoes.    Time  4    Period  Weeks    Status  New    Target Date  07/05/19            Plan - 06/12/19 1742    Clinical Impression Statement  Pt. benefits from use of SPC with higher level balance tasks/ ambulation on uneven terrain and turning.  Pt. able to complete cone taps without UE assist at agility ladder with extra time/ caution.  Few rest breaks required and cuing during dynamic balance tasks.  No change to HEP.    Stability/Clinical Decision Making  Evolving/Moderate complexity    Clinical Decision Making  Moderate    Rehab Potential  Fair    PT Frequency  2x / week     PT Duration  4 weeks    PT Treatment/Interventions  ADLs/Self Care Home Management;Gait training;Stair training;Functional mobility training;Neuromuscular re-education;Balance training;Therapeutic exercise;Therapeutic activities;Patient/family education;DME Instruction    PT Next Visit Plan  Issue HEP/ progress with dynamic balance tasks.  Issue new HEP       Patient will benefit from skilled therapeutic intervention in order to improve the following deficits and impairments:  Abnormal gait, Decreased balance, Decreased endurance, Decreased mobility, Difficulty walking, Improper body mechanics, Decreased range of motion, Decreased activity tolerance, Decreased coordination, Decreased strength, Decreased safety awareness, Impaired UE functional use, Postural dysfunction  Visit Diagnosis: Muscle weakness (generalized)  Gait difficulty  Difficulty balancing     Problem List Patient Active Problem List   Diagnosis Date Noted  . Angina at rest [facility]) 05/13/2019  . Dyspnea on exertion 05/13/2019  . Excessive weight loss 05/13/2019  . Left arm pain 05/13/2019  . Superior mesenteric artery stenosis ([place]) 04/27/2019  . Tobacco use disorder 04/27/2019  . Thoracic aortic aneurysm ([place]) 04/27/2019  . Atherosclerosis of native arteries of extremity with intermittent claudication ([place]) 04/27/2019  . Difficulty balancing 03/23/2019  . Bilateral arm weakness 03/09/2019  . Bilateral hand numbness 03/09/2019  . Neck pain 03/09/2019  . Other symptoms and signs involving the musculoskeletal system 02/17/2019  . Tremor 02/17/2019  . Abnormal CT scan, colon 11/27/2017  . Alcoholism ([place]) 11/27/2017  . History of colon polyps 11/27/2017  . Low serum alkaline phosphatase 11/27/2017  . Hyponatremia 10/24/2017  . Primary osteoarthritis of left knee 07/24/2017  . Cardiomyopathy ([place]) 12/08/2013  . ED (erectile dysfunction) 12/08/2013  . Hypertension 12/08/2013  . Hypertriglyceridemia 12/08/2013    Pura Spice, PT, DPT # [phone] 06/12/2019, 5:44 PM  [facility] [address] [place], [place], [address] Phone: [phone]   Fax:  [phone]  Name: Alexander Welch MRN: [id number] Date of Birth: [birth year]

## 2019-06-12 NOTE — Therapy (Signed)
[facility] [address] [place], [place], [address] Phone: [phone]   Fax:  [phone]  Physical Therapy Evaluation  Patient Details  Name: Alexander Welch MRN: [id number] Date of Birth: 09-18-1960 Referring Provider (PT): Dr. Melrose Nakayama   Encounter Date: 06/07/2019  PT End of Session - 06/12/19 1513    Visit Number  1    Number of Visits  8    Date for PT Re-Evaluation  07/05/19    Authorization - Visit Number  1    Authorization - Number of Visits  10    PT Start Time  1340    PT Stop Time  1436    PT Time Calculation (min)  56 min    Equipment Utilized During Treatment  Gait belt    Activity Tolerance  Patient tolerated treatment well    Behavior During Therapy  [facility] for tasks assessed/performed       Past Medical History:  Diagnosis Date  . Abnormal CT scan, colon 11/27/2017  . Alcohol abuse   . Cardiomyopathy ([place])   . CHF (congestive heart failure) ([place])   . Colon polyps   . ED (erectile dysfunction)   . Hypertension   . Hypertriglyceridemia   . Osteoarthritis   . Osteoarthritis     Past Surgical History:  Procedure Laterality Date  . COLONOSCOPY    . COLONOSCOPY WITH PROPOFOL N/A 02/02/2018   Procedure: COLONOSCOPY WITH PROPOFOL;  Surgeon: Manya Silvas, MD;  Location: [facility] ENDOSCOPY;  Service: Endoscopy;  Laterality: N/A;    There were no vitals filed for this visit.   Subjective Assessment - 06/12/19 1450    Subjective  Pt. referred to PT to focus on balance/ gait training.  Pt. states he was sent to ER via ambulalance yesterday due to falling while taking out the trash.  Pts. ER note states pt. was intoxicated and blacked out/ hit head.  Pt. arrived to PT with no assistive device and no c/o pain at this time.  Pt. completed FCE recently for disability determination.    Limitations  Lifting;Standing;Walking    Patient Stated Goals  Improve LE muscle strength/ balance to improve independence with gait.     Currently in Pain?  No/denies         [facility] PT Assessment - 06/12/19 0001      Assessment   Medical Diagnosis  Sensory ataxia/ Gait difficulty    Referring Provider (PT)  Dr. Melrose Nakayama    Hand Dominance  Right    Prior Therapy  No, FCE completed recently      Precautions   Precautions  Fall      Balance Screen   Has the patient fallen in the past 6 months  Yes    How many times?  4+    Has the patient had a decrease in activity level because of a fear of falling?   Yes      [place] residence    Home Access  Stairs to enter    Entrance Stairs-Number of Steps  2      Prior Function   Level of Independence  Independent    Vocation  On disability    Vocation Requirements  [place] (Moderate to Heavy Duty Job demands      Cognition   Overall Cognitive Status  Within Functional Limits for tasks assessed       See flowsheet    Objective measurements  completed on examination: See above findings.    There.ex:  Standing hip flexion/ lateral walking in //-bars with no UE assist Step ups/ downs 10x STS with no UE assist 5x2 Discussed HEP    PT Education - 06/12/19 1513    Education Details  SPC use at home/ outside to improve safety/ decrease fall risk    Person(s) Educated  Patient    Methods  Explanation;Demonstration    Comprehension  Verbalized understanding;Returned demonstration          PT Long Term Goals - 06/12/19 1724      PT LONG TERM GOAL #1   Title  Pt. will increase FOTO score to 75 to improve functional mobility.    Baseline  initial FOTO: 49    Time  4    Period  Weeks    Status  New    Target Date  07/05/19      PT LONG TERM GOAL #2   Title  Pt. will increase Berg balance score to >50/56 to decrease fall risk/ improve independence with gait.    Baseline  Initial Berg: 46/56    Time  4    Period  Weeks    Status  New    Target Date  07/05/19      PT LONG TERM GOAL #3   Title  Pt. will  ambulate with mod. independence and more normalized gait pattern on outside/grassy surfaces with no LOB.    Baseline  H/o falls on outside terrain/ occasional shuffling gait/ decrease hip flexion/ step length.    Time  4    Period  Weeks    Status  New    Target Date  07/05/19      PT LONG TERM GOAL #4   Title  Pt. able to don/doff socks and shoes independently.    Baseline  assist to don/doff socks and shoes.    Time  4    Period  Weeks    Status  New    Target Date  07/05/19             Plan - 06/12/19 1515    Clinical Impression Statement  Pt. is a 59 y/o male with diagnosis of sensory ataxia and gait/ balance difficulty.  Pt. reports no pain.  Pt. reports fall with injury/ cut to head yesterday due to alcohol while taking out trash.  FOTO: initial 49/ goal 75.  B LE AROM WNL and strength grossly 5/5 MMT except hip flexion 4/5 MMT and hip adduction 4/5 MMT.  Tandem stance 20 sec., SLS (L easier than R).  Increase generalized fatigue noted during Berg balance test/ obstacle course.  Berg: 46/56 (benefits from [facility]).  STS with no UE assist safely.  Pt. has difficulty bending over to put on socks/ shoes.  Pt. will benefit from skilled PT services to increase B hip strength/ balance training to improve safety/ independence with walking.    Stability/Clinical Decision Making  Evolving/Moderate complexity    Clinical Decision Making  Moderate    Rehab Potential  Fair    PT Frequency  2x / week    PT Duration  4 weeks    PT Treatment/Interventions  ADLs/Self Care Home Management;Gait training;Stair training;Functional mobility training;Neuromuscular re-education;Balance training;Therapeutic exercise;Therapeutic activities;Patient/family education;DME Instruction    PT Next Visit Plan  Issue HEP/ progress with dynamic balance tasks.       Patient will benefit from skilled therapeutic intervention in order to improve the following deficits and impairments:  Abnormal gait, Decreased  balance, Decreased endurance, Decreased mobility, Difficulty walking, Improper body mechanics, Decreased range of motion, Decreased activity tolerance, Decreased coordination, Decreased strength, Decreased safety awareness, Impaired UE functional use, Postural dysfunction  Visit Diagnosis: Muscle weakness (generalized)  Gait difficulty  Difficulty balancing     Problem List Patient Active Problem List   Diagnosis Date Noted  . Angina at rest [facility]) 05/13/2019  . Dyspnea on exertion 05/13/2019  . Excessive weight loss 05/13/2019  . Left arm pain 05/13/2019  . Superior mesenteric artery stenosis ([place]) 04/27/2019  . Tobacco use disorder 04/27/2019  . Thoracic aortic aneurysm ([place]) 04/27/2019  . Atherosclerosis of native arteries of extremity with intermittent claudication ([place]) 04/27/2019  . Difficulty balancing 03/23/2019  . Bilateral arm weakness 03/09/2019  . Bilateral hand numbness 03/09/2019  . Neck pain 03/09/2019  . Other symptoms and signs involving the musculoskeletal system 02/17/2019  . Tremor 02/17/2019  . Abnormal CT scan, colon 11/27/2017  . Alcoholism ([place]) 11/27/2017  . History of colon polyps 11/27/2017  . Low serum alkaline phosphatase 11/27/2017  . Hyponatremia 10/24/2017  . Primary osteoarthritis of left knee 07/24/2017  . Cardiomyopathy ([place]) 12/08/2013  . ED (erectile dysfunction) 12/08/2013  . Hypertension 12/08/2013  . Hypertriglyceridemia 12/08/2013   Pura Spice, PT, DPT # [phone] 06/12/2019, 5:29 PM  [facility] [address] [place], [place], [address] Phone: [phone]   Fax:  [phone]  Name: Alexander Welch MRN: [id number] Date of Birth: [birth year]

## 2019-06-14 ENCOUNTER — Encounter: Payer: Self-pay | Admitting: Physical Therapy

## 2019-06-14 ENCOUNTER — Ambulatory Visit: Payer: BC Managed Care – PPO | Admitting: Physical Therapy

## 2019-06-14 ENCOUNTER — Other Ambulatory Visit: Payer: Self-pay

## 2019-06-14 DIAGNOSIS — M6281 Muscle weakness (generalized): Secondary | ICD-10-CM | POA: Diagnosis not present

## 2019-06-14 DIAGNOSIS — R29818 Other symptoms and signs involving the nervous system: Secondary | ICD-10-CM

## 2019-06-14 DIAGNOSIS — R269 Unspecified abnormalities of gait and mobility: Secondary | ICD-10-CM

## 2019-06-14 NOTE — Therapy (Signed)
[facility] [address] [place], [place], [address] Phone: [phone]   Fax:  [phone]  Physical Therapy Treatment  Patient Details  Name: Alexander Welch MRN: [id number] Date of Birth: 11/24/1960 Referring Provider (PT): Dr. Melrose Nakayama   Encounter Date: 06/14/2019  PT End of Session - 06/14/19 1327    Visit Number  3    Number of Visits  8    Date for PT Re-Evaluation  07/05/19    Authorization - Visit Number  3    Authorization - Number of Visits  10    PT Start Time  [id number]    PT Stop Time  1426    PT Time Calculation (min)  48 min    Equipment Utilized During Treatment  Gait belt    Activity Tolerance  Patient tolerated treatment well    Behavior During Therapy  [facility] for tasks assessed/performed       Past Medical History:  Diagnosis Date  . Abnormal CT scan, colon 11/27/2017  . Alcohol abuse   . Cardiomyopathy ([place])   . CHF (congestive heart failure) ([place])   . Colon polyps   . ED (erectile dysfunction)   . Hypertension   . Hypertriglyceridemia   . Osteoarthritis   . Osteoarthritis     Past Surgical History:  Procedure Laterality Date  . COLONOSCOPY    . COLONOSCOPY WITH PROPOFOL N/A 02/02/2018   Procedure: COLONOSCOPY WITH PROPOFOL;  Surgeon: Manya Silvas, MD;  Location: [facility] ENDOSCOPY;  Service: Endoscopy;  Laterality: N/A;    There were no vitals filed for this visit.  Subjective Assessment - 06/14/19 1327    Subjective  Pt. entered PT with reports of fatigue.  Pt. states he has not eaten breakfast or lunch today.  No falls or new complaints.    Limitations  Lifting;Standing;Walking    Patient Stated Goals  Improve LE muscle strength/ balance to improve independence with gait.    Currently in Pain?  No/denies        There.ex.:  Nustep L5 10 min. B UE/LE (consistent cadence)- warm-up/ pt. Reports generalized fatigue.   Standing ex. (4# ankle wts.):  hip marching/ hip extension/ walking up and  down 4 steps 4x with light no UE assist on handrails.  Seated marching/ LAQ/ toe and heel raises 20x each.   Sit to stands with no UE assist 10x.  Neuro.mm.:  Nautilus: resisted walking 70# f/b and 60# lateral with no UE assist 5x each. Walking in hallway with cone taps/ alt. UE and LE/ head turning Walking outside on grassy/ down ramps/ curbs/ stairs with 1 handrail assist without use of SPC.      PT Long Term Goals - 06/12/19 1724      PT LONG TERM GOAL #1   Title  Pt. will increase FOTO score to 75 to improve functional mobility.    Baseline  initial FOTO: 49    Time  4    Period  Weeks    Status  New    Target Date  07/05/19      PT LONG TERM GOAL #2   Title  Pt. will increase Berg balance score to >50/56 to decrease fall risk/ improve independence with gait.    Baseline  Initial Berg: 46/56    Time  4    Period  Weeks    Status  New    Target Date  07/05/19      PT LONG TERM GOAL #3  Title  Pt. will ambulate with mod. independence and more normalized gait pattern on outside/grassy surfaces with no LOB.    Baseline  H/o falls on outside terrain/ occasional shuffling gait/ decrease hip flexion/ step length.    Time  4    Period  Weeks    Status  New    Target Date  07/05/19      PT LONG TERM GOAL #4   Title  Pt. able to don/doff socks and shoes independently.    Baseline  assist to don/doff socks and shoes.    Time  4    Period  Weeks    Status  New    Target Date  07/05/19            Plan - 06/14/19 1328    Clinical Impression Statement  Pt. completed all therex./ balance tasks without use of SPC in clinic today.  No LOB but moderate LE muscle fatigue after resisted LE ex./ balance tasks.  Pt. requires occasional cuing to increase hip flexion/ step pattern to prevent shuffling gait.  Pt. will continue with LE ther.ex./ daily walking program.    Stability/Clinical Decision Making  Evolving/Moderate complexity    Clinical Decision Making  Moderate    Rehab  Potential  Fair    PT Frequency  2x / week    PT Duration  4 weeks    PT Treatment/Interventions  ADLs/Self Care Home Management;Gait training;Stair training;Functional mobility training;Neuromuscular re-education;Balance training;Therapeutic exercise;Therapeutic activities;Patient/family education;DME Instruction    PT Next Visit Plan  Issue HEP/ progress with dynamic balance tasks.       Patient will benefit from skilled therapeutic intervention in order to improve the following deficits and impairments:  Abnormal gait, Decreased balance, Decreased endurance, Decreased mobility, Difficulty walking, Improper body mechanics, Decreased range of motion, Decreased activity tolerance, Decreased coordination, Decreased strength, Decreased safety awareness, Impaired UE functional use, Postural dysfunction  Visit Diagnosis: Muscle weakness (generalized)  Gait difficulty  Difficulty balancing     Problem List Patient Active Problem List   Diagnosis Date Noted  . Angina at rest [facility]) 05/13/2019  . Dyspnea on exertion 05/13/2019  . Excessive weight loss 05/13/2019  . Left arm pain 05/13/2019  . Superior mesenteric artery stenosis ([place]) 04/27/2019  . Tobacco use disorder 04/27/2019  . Thoracic aortic aneurysm ([place]) 04/27/2019  . Atherosclerosis of native arteries of extremity with intermittent claudication ([place]) 04/27/2019  . Difficulty balancing 03/23/2019  . Bilateral arm weakness 03/09/2019  . Bilateral hand numbness 03/09/2019  . Neck pain 03/09/2019  . Other symptoms and signs involving the musculoskeletal system 02/17/2019  . Tremor 02/17/2019  . Abnormal CT scan, colon 11/27/2017  . Alcoholism ([place]) 11/27/2017  . History of colon polyps 11/27/2017  . Low serum alkaline phosphatase 11/27/2017  . Hyponatremia 10/24/2017  . Primary osteoarthritis of left knee 07/24/2017  . Cardiomyopathy ([place]) 12/08/2013  . ED (erectile dysfunction) 12/08/2013  . Hypertension 12/08/2013  .  Hypertriglyceridemia 12/08/2013   Pura Spice, PT, DPT # [phone] 06/14/2019, 6:21 PM  [facility] [address] [place], [place], [address] Phone: [phone]   Fax:  [phone]  Name: Alexander Welch MRN: [id number] Date of Birth: 07-Sep-1960

## 2019-06-15 ENCOUNTER — Encounter (INDEPENDENT_AMBULATORY_CARE_PROVIDER_SITE_OTHER): Payer: Self-pay

## 2019-06-15 ENCOUNTER — Telehealth (INDEPENDENT_AMBULATORY_CARE_PROVIDER_SITE_OTHER): Payer: Self-pay

## 2019-06-15 NOTE — Telephone Encounter (Signed)
I attempted to contact the patient and a message was left for a return call. I did leave a message once before for a return call.

## 2019-06-15 NOTE — Telephone Encounter (Signed)
Patient returned my call and is now schedule with Dr. Lucky Cowboy for a RLE angio on 06/21/19 with a 9:30 am arrival time to the [facility]. Patient will do covid testing on 06/17/19 between 12:30-2:30 pm at the [facility]. Pre-procedure instructions were discussed and will be mailed to the patient.

## 2019-06-16 ENCOUNTER — Ambulatory Visit: Payer: BC Managed Care – PPO | Admitting: Physical Therapy

## 2019-06-17 ENCOUNTER — Other Ambulatory Visit: Payer: Self-pay

## 2019-06-17 ENCOUNTER — Other Ambulatory Visit
Admission: RE | Admit: 2019-06-17 | Discharge: 2019-06-17 | Disposition: A | Discharge status: Home / Self Care | Payer: BC Managed Care – PPO | Source: Ambulatory Visit | Attending: Vascular Surgery | Admitting: Vascular Surgery

## 2019-06-17 DIAGNOSIS — Z20822 Contact with and (suspected) exposure to covid-19: Secondary | ICD-10-CM | POA: Insufficient documentation

## 2019-06-17 LAB — SARS CORONAVIRUS 2 (TAT 6-24 HRS): SARS Coronavirus 2: NEGATIVE

## 2019-06-20 ENCOUNTER — Other Ambulatory Visit (INDEPENDENT_AMBULATORY_CARE_PROVIDER_SITE_OTHER): Payer: Self-pay | Admitting: Nurse Practitioner

## 2019-06-21 ENCOUNTER — Encounter
Admission: RE | Disposition: A | Discharge status: Home / Self Care | Payer: Self-pay | Source: Home / Self Care | Attending: Vascular Surgery

## 2019-06-21 ENCOUNTER — Encounter: Payer: Self-pay | Admitting: Vascular Surgery

## 2019-06-21 ENCOUNTER — Encounter: Payer: BC Managed Care – PPO | Admitting: Physical Therapy

## 2019-06-21 ENCOUNTER — Other Ambulatory Visit: Payer: Self-pay

## 2019-06-21 ENCOUNTER — Ambulatory Visit
Admission: RE | Admit: 2019-06-21 | Discharge: 2019-06-21 | Disposition: A | Discharge status: Home / Self Care | Payer: BC Managed Care – PPO | Attending: Vascular Surgery | Admitting: Vascular Surgery

## 2019-06-21 DIAGNOSIS — I429 Cardiomyopathy, unspecified: Secondary | ICD-10-CM | POA: Diagnosis not present

## 2019-06-21 DIAGNOSIS — E781 Pure hyperglyceridemia: Secondary | ICD-10-CM | POA: Diagnosis not present

## 2019-06-21 DIAGNOSIS — I739 Peripheral vascular disease, unspecified: Secondary | ICD-10-CM

## 2019-06-21 DIAGNOSIS — I70211 Atherosclerosis of native arteries of extremities with intermittent claudication, right leg: Secondary | ICD-10-CM

## 2019-06-21 DIAGNOSIS — I712 Thoracic aortic aneurysm, without rupture: Secondary | ICD-10-CM | POA: Diagnosis not present

## 2019-06-21 DIAGNOSIS — F17210 Nicotine dependence, cigarettes, uncomplicated: Secondary | ICD-10-CM | POA: Insufficient documentation

## 2019-06-21 DIAGNOSIS — I70219 Atherosclerosis of native arteries of extremities with intermittent claudication, unspecified extremity: Secondary | ICD-10-CM | POA: Diagnosis not present

## 2019-06-21 DIAGNOSIS — I110 Hypertensive heart disease with heart failure: Secondary | ICD-10-CM | POA: Diagnosis not present

## 2019-06-21 DIAGNOSIS — E785 Hyperlipidemia, unspecified: Secondary | ICD-10-CM | POA: Diagnosis not present

## 2019-06-21 DIAGNOSIS — I509 Heart failure, unspecified: Secondary | ICD-10-CM | POA: Insufficient documentation

## 2019-06-21 HISTORY — PX: LOWER EXTREMITY ANGIOGRAPHY: CATH118251

## 2019-06-21 LAB — CREATININE, SERUM
Creatinine, Ser: 0.77 mg/dL (ref 0.61–1.24)
GFR calc Af Amer: >60 mL/min (ref >60)
GFR calc non Af Amer: >60 mL/min (ref >60)

## 2019-06-21 LAB — BUN: BUN: 7 mg/dL (ref 6–20)

## 2019-06-21 SURGERY — LOWER EXTREMITY ANGIOGRAPHY
Anesthesia: Moderate Sedation | Laterality: Right

## 2019-06-21 MED ORDER — IODIXANOL 320 MG/ML IV SOLN
INTRAVENOUS | Status: DC | PRN
  Administered 2019-06-21: 13:00:00 50 mL via INTRA_ARTERIAL

## 2019-06-21 MED ORDER — DIPHENHYDRAMINE HCL 50 MG/ML IJ SOLN
INTRAMUSCULAR | Status: AC
  Filled 2019-06-21: qty 1

## 2019-06-21 MED ORDER — HYDRALAZINE HCL 20 MG/ML IJ SOLN: 5.0000 mg | INTRAMUSCULAR | Status: DC | PRN

## 2019-06-21 MED ORDER — LABETALOL HCL 5 MG/ML IV SOLN: 10.0000 mg | INTRAVENOUS | Status: DC | PRN

## 2019-06-21 MED ORDER — FENTANYL CITRATE (PF) 100 MCG/2ML IJ SOLN
INTRAMUSCULAR | Status: AC
  Filled 2019-06-21: qty 2

## 2019-06-21 MED ORDER — ONDANSETRON HCL 4 MG/2ML IJ SOLN: 4.0000 mg | Freq: Four times a day (QID) | INTRAMUSCULAR | Status: DC | PRN

## 2019-06-21 MED ORDER — HYDROMORPHONE HCL 1 MG/ML IJ SOLN: 1.0000 mg | Freq: Once | INTRAMUSCULAR | Status: DC | PRN

## 2019-06-21 MED ORDER — CLOPIDOGREL BISULFATE 75 MG PO TABS: 75.0000 mg | ORAL_TABLET | Freq: Every day | ORAL | 11 refills | Status: DC

## 2019-06-21 MED ORDER — MIDAZOLAM HCL 2 MG/ML PO SYRP: 8.0000 mg | ORAL_SOLUTION | Freq: Once | ORAL | Status: DC | PRN

## 2019-06-21 MED ORDER — SODIUM CHLORIDE 0.9 % IV SOLN: INTRAVENOUS | Status: DC

## 2019-06-21 MED ORDER — HEPARIN SODIUM (PORCINE) 1000 UNIT/ML IJ SOLN
INTRAMUSCULAR | Status: AC
  Filled 2019-06-21: qty 1

## 2019-06-21 MED ORDER — SODIUM CHLORIDE 0.9% FLUSH: 3.0000 mL | INTRAVENOUS | Status: DC | PRN

## 2019-06-21 MED ORDER — ACETAMINOPHEN 325 MG PO TABS: 650.0000 mg | ORAL_TABLET | ORAL | Status: DC | PRN

## 2019-06-21 MED ORDER — FENTANYL CITRATE (PF) 100 MCG/2ML IJ SOLN
INTRAMUSCULAR | Status: DC | PRN
  Administered 2019-06-21: 25 ug via INTRAVENOUS
  Administered 2019-06-21: 50 ug via INTRAVENOUS

## 2019-06-21 MED ORDER — DIPHENHYDRAMINE HCL 50 MG/ML IJ SOLN: 50.0000 mg | Freq: Once | INTRAMUSCULAR | Status: DC | PRN

## 2019-06-21 MED ORDER — ATORVASTATIN CALCIUM 10 MG PO TABS
10.0000 mg | ORAL_TABLET | Freq: Every day | ORAL | Status: DC
  Filled 2019-06-21 (×2): qty 1

## 2019-06-21 MED ORDER — FAMOTIDINE 20 MG PO TABS: 40.0000 mg | ORAL_TABLET | Freq: Once | ORAL | Status: DC | PRN

## 2019-06-21 MED ORDER — ATORVASTATIN CALCIUM 10 MG PO TABS: 10.0000 mg | ORAL_TABLET | Freq: Every day | ORAL | 11 refills | Status: DC

## 2019-06-21 MED ORDER — HEPARIN SODIUM (PORCINE) 1000 UNIT/ML IJ SOLN
INTRAMUSCULAR | Status: DC | PRN
  Administered 2019-06-21: 5000 [IU] via INTRAVENOUS

## 2019-06-21 MED ORDER — SODIUM CHLORIDE 0.9 % IV SOLN: 250.0000 mL | INTRAVENOUS | Status: DC | PRN

## 2019-06-21 MED ORDER — METHYLPREDNISOLONE SODIUM SUCC 125 MG IJ SOLR: 125.0000 mg | Freq: Once | INTRAMUSCULAR | Status: DC | PRN

## 2019-06-21 MED ORDER — CLOPIDOGREL BISULFATE 75 MG PO TABS: 75.0000 mg | ORAL_TABLET | Freq: Every day | ORAL | Status: DC

## 2019-06-21 MED ORDER — DIPHENHYDRAMINE HCL 50 MG/ML IJ SOLN
INTRAMUSCULAR | Status: DC | PRN
  Administered 2019-06-21: 25 mg via INTRAVENOUS

## 2019-06-21 MED ORDER — CEFAZOLIN SODIUM-DEXTROSE 2-4 GM/100ML-% IV SOLN
2.0000 g | Freq: Once | INTRAVENOUS | Status: AC
  Administered 2019-06-21: 2 g via INTRAVENOUS

## 2019-06-21 MED ORDER — MIDAZOLAM HCL 2 MG/2ML IJ SOLN
INTRAMUSCULAR | Status: DC | PRN
  Administered 2019-06-21: 0.5 mg via INTRAVENOUS
  Administered 2019-06-21: 2 mg via INTRAVENOUS

## 2019-06-21 MED ORDER — SODIUM CHLORIDE 0.9 % IV BOLUS
INTRAVENOUS | Status: AC | PRN
  Administered 2019-06-21: 250 mL via INTRAVENOUS

## 2019-06-21 MED ORDER — MIDAZOLAM HCL 5 MG/5ML IJ SOLN
INTRAMUSCULAR | Status: AC
  Filled 2019-06-21: qty 5

## 2019-06-21 MED ORDER — SODIUM CHLORIDE 0.9% FLUSH: 3.0000 mL | Freq: Two times a day (BID) | INTRAVENOUS | Status: DC

## 2019-06-21 SURGICAL SUPPLY — 14 items
BALLN LUTONIX DCB 6X100X130 (BALLOONS) ×3
BALLOON LUTONIX DCB 6X100X130 (BALLOONS) ×1 IMPLANT
CATH BEACON 5 .038 100 VERT TP (CATHETERS) ×3 IMPLANT
CATH PIG 70CM (CATHETERS) ×3 IMPLANT
DEVICE PRESTO INFLATION (MISCELLANEOUS) ×3 IMPLANT
DEVICE STARCLOSE SE CLOSURE (Vascular Products) ×3 IMPLANT
GLIDEWIRE ADV .035X260CM (WIRE) ×3 IMPLANT
PACK ANGIOGRAPHY (CUSTOM PROCEDURE TRAY) ×3 IMPLANT
SHEATH ANL2 6FRX45 HC (SHEATH) ×3 IMPLANT
SHEATH BRITE TIP 5FRX11 (SHEATH) ×3 IMPLANT
STENT LIFESTENT 5F 7X80X135 (Permanent Stent) ×3 IMPLANT
SYR MEDRAD MARK 7 150ML (SYRINGE) ×3 IMPLANT
TUBING CONTRAST HIGH PRESS 72 (TUBING) ×3 IMPLANT
WIRE GUIDERIGHT .035X150 (WIRE) ×3 IMPLANT

## 2019-06-21 NOTE — Progress Notes (Signed)
Dr. Lucky Cowboy spoke with pt. And his spouse re: procedural results. Both verbalized understanding of conversation. Post procedural & DC  Instructions reviewed with pt. And wife with verbalized understanding.

## 2019-06-21 NOTE — H&P (Signed)
[facility] Admission History & Physical  MRN : [id number]  [name] is a 59 y.o. (09-25-1960) male who presents with chief complaint of scheduled right lower extremity angiogram with intervention for atherosclerotic disease.  History of Present Illness:  [facility] Welch is a 59 y.o. male presented to our office for follow-up studies.  The patient previously had a recent CT scan of his chest and abdomen which was found to have an ascending thoracic aortic aneurysm in addition to some moderate stenosis of the SMA.  There was also significant aortoiliac calcification which appear to be a moderate to high-grade stenosis of the right common femoral artery with proximal femoral artery disease and separate lesions.  The left femoral bifurcation also has some disease however it did not appear to be as severe.  The patient continues to deny any signs symptoms of peripheral embolization. He does however have symptoms of lower extremity peripheral arterial disease.  He has pain and cramping in both of his lower legs with walking. He is not able to walk for long distances before he begins to claudicate and has to stop.  His bilateral lower extremities are affected equally.  He denies any open wounds or ulcerations.  He states that his pain does wake him up at night from sleep and it also causes him to dangle his legs at times.    The patient has an ABI of 1.20 on the right and 1.21 on the left.  The patient also underwent bilateral lower extremity arterial duplexes which revealed triphasic waveforms throughout the bilateral lower extremities.  The patient presents today for planned right lower extremity angiogram with possible intervention for atherosclerotic disease with claudication to the right lower extremity.  Current Facility-Administered Medications  Medication Dose Route Frequency Provider Last Rate Last Admin  . 0.9 %  sodium chloride infusion   Intravenous Continuous  Kris Hartmann, NP 75 mL/hr at 06/21/19 0948 New Bag at 06/21/19 0948  . ceFAZolin (ANCEF) IVPB 2g/100 mL premix  2 g Intravenous Once Kris Hartmann, NP      . diphenhydrAMINE (BENADRYL) injection 50 mg  50 mg Intravenous Once PRN Kris Hartmann, NP      . famotidine (PEPCID) tablet 40 mg  40 mg Oral Once PRN Kris Hartmann, NP      . HYDROmorphone (DILAUDID) injection 1 mg  1 mg Intravenous Once PRN Eulogio Ditch E, NP      . methylPREDNISolone sodium succinate (SOLU-MEDROL) 125 mg/2 mL injection 125 mg  125 mg Intravenous Once PRN Eulogio Ditch E, NP      . midazolam (VERSED) 2 MG/ML syrup 8 mg  8 mg Oral Once PRN Kris Hartmann, NP      . ondansetron (ZOFRAN) injection 4 mg  4 mg Intravenous Q6H PRN Kris Hartmann, NP       Past Medical History:  Diagnosis Date  . Abnormal CT scan, colon 11/27/2017  . Alcohol abuse   . Cardiomyopathy ([place])   . CHF (congestive heart failure) ([place])   . Colon polyps   . ED (erectile dysfunction)   . Hypertension   . Hypertriglyceridemia   . Osteoarthritis   . Osteoarthritis    Past Surgical History:  Procedure Laterality Date  . COLONOSCOPY    . COLONOSCOPY WITH PROPOFOL N/A 02/02/2018   Procedure: COLONOSCOPY WITH PROPOFOL;  Surgeon: Manya Silvas, MD;  Location: [facility] ENDOSCOPY;  Service: Endoscopy;  Laterality: N/A;   Social History  Social History   Tobacco Use  . Smoking status: Current Every Day Smoker    Packs/day: 0.50    Years: 40.00    Pack years: 20.00    Types: Cigarettes  . Smokeless tobacco: Never Used  Substance Use Topics  . Alcohol use: Yes    Comment: 1/4 pint of vodka daily  . Drug use: Yes    Types: Marijuana   Family History Family History  Problem Relation Age of Onset  . Heart failure Mother   . Renal Disease Mother   . Bone cancer Father   Patient denies any family history of peripheral artery disease, venous disease or bleeding/clotting disorders.  No Known Allergies  REVIEW OF SYSTEMS (Negative  unless checked)  Constitutional: [] Weight loss  [] Fever  [] Chills Cardiac: [] Chest pain   [] Chest pressure   [] Palpitations   [] Shortness of breath when laying flat   [] Shortness of breath at rest   [x] Shortness of breath with exertion. Vascular:  [x] Pain in legs with walking   [] Pain in legs at rest   [] Pain in legs when laying flat   [] Claudication   [x] Pain in feet when walking  [] Pain in feet at rest  [] Pain in feet when laying flat   [] History of DVT   [] Phlebitis   [] Swelling in legs   [] Varicose veins   [] Non-healing ulcers Pulmonary:   [] Uses home oxygen   [] Productive cough   [] Hemoptysis   [] Wheeze  [] COPD   [] Asthma Neurologic:  [] Dizziness  [] Blackouts   [] Seizures   [] History of stroke   [] History of TIA  [] Aphasia   [] Temporary blindness   [] Dysphagia   [] Weakness or numbness in arms   [] Weakness or numbness in legs Musculoskeletal:  [] Arthritis   [] Joint swelling   [] Joint pain   [] Low back pain Hematologic:  [] Easy bruising  [] Easy bleeding   [] Hypercoagulable state   [] Anemic  [] Hepatitis Gastrointestinal:  [] Blood in stool   [] Vomiting blood  [] Gastroesophageal reflux/heartburn   [] Difficulty swallowing. Genitourinary:  [] Chronic kidney disease   [] Difficult urination  [] Frequent urination  [] Burning with urination   [] Blood in urine Skin:  [] Rashes   [] Ulcers   [] Wounds Psychological:  [] History of anxiety   []  History of major depression.  Physical Examination  Vitals:   06/21/19 0932  BP: 95/71  Pulse: 76  Resp: 17  Temp: 98.4 F (36.9 C)  TempSrc: Oral  SpO2: 100%  Weight: 68 kg  Height: 5\' 7"  (1.702 m)   Body mass index is 23.49 kg/m. Gen: WD/WN, NAD Head: [place]/AT, No temporalis wasting.  Ear/Nose/Throat: Hearing grossly intact, nares w/o erythema or drainage, oropharynx w/o Erythema/Exudate, Eyes: Sclera non-icteric, conjunctiva clear Neck: Supple, no nuchal rigidity.  No JVD.  Pulmonary:  Good air movement, no increased work of respiration or use of accessory  muscles  Cardiac: RRR, normal S1, S2, no Murmurs, rubs or gallops. Vascular:  Vessel Right Left  Radial Palpable Palpable  Ulnar Palpable Palpable  Brachial Palpable Palpable  Carotid Palpable, without bruit Palpable, without bruit  Aorta Not palpable N/A  Femoral Palpable Palpable  Popliteal Palpable Palpable  PT Non-Palpable Palpable  DP Non-Palpable Palpable   Gastrointestinal: soft, non-tender/non-distended. No guarding/reflex. No masses, surgical incisions, or scars. Musculoskeletal: M/S 5/5 throughout.  No deformity or atrophy.  Minimal edema Neurologic: Sensation grossly intact in extremities.  Symmetrical.  Speech is fluent. Motor exam as listed above. Psychiatric: Judgment intact, Mood & affect appropriate for pt's clinical situation. Dermatologic: No rashes or ulcers noted.  No cellulitis or open wounds. Lymph : No Cervical, Axillary, or Inguinal lymphadenopathy.  CBC Lab Results  Component Value Date   WBC 6.2 06/06/2019   HGB 13.0 06/06/2019   HCT 37.0 (L) 06/06/2019   MCV 94.9 06/06/2019   PLT 208 06/06/2019   BMET    Component Value Date/Time   NA 135 06/06/2019 2135   K 2.9 (L) 06/06/2019 2135   CL 92 (L) 06/06/2019 2135   CO2 27 06/06/2019 2135   GLUCOSE 97 06/06/2019 2135   BUN <5 (L) 06/06/2019 2135   CREATININE 0.79 06/06/2019 2135   CALCIUM 8.7 (L) 06/06/2019 2135   GFRNONAA >60 06/06/2019 2135   GFRAA >60 06/06/2019 2135   Estimated Creatinine Clearance: 91.8 mL/min (by C-G formula based on SCr of 0.79 mg/dL).  COAG Lab Results  Component Value Date   INR 0.98 10/24/2017   Radiology CT Head Wo Contrast  Result Date: 06/06/2019 CLINICAL DATA:  Recent fall with facial laceration, initial encounter EXAM: CT HEAD WITHOUT CONTRAST TECHNIQUE: Contiguous axial images were obtained from the base of the skull through the vertex without intravenous contrast. COMPARISON:  None. FINDINGS: Brain: No evidence of acute infarction, hemorrhage, hydrocephalus,  extra-axial collection or mass lesion/mass effect. Mild atrophic changes are noted commensurate with the patient's given age. Bilateral basal ganglia calcifications are seen. Vascular: No hyperdense vessel or unexpected calcification. Skull: Normal. Negative for fracture or focal lesion. Sinuses/Orbits: No acute finding. Other: None. IMPRESSION: Atrophic changes without acute abnormality. Electronically Signed   By: Inez Catalina M.D.   On: 06/06/2019 23:56   Assessment/Plan The patient is a 59 year old male with multiple medical issues including atherosclerotic disease who presented to outpatient clinic with progressively worsening claudication to the right lower extremity.  He presents today for a planned right lower extremity angiogram with possible intervention.  1.  Possible atherosclerotic disease of the right lower extremity: Patient has been experiencing worsening right lower extremity claudication.  Patient presents today to undergo a right lower extremity angiogram with possible intervention to assess his anatomy and contributing degree of peripheral artery disease.  If appropriate, an attempt to revascularize the leg can be made at that time.  Procedure, risks and benefits explained to the patient.  All questions answered.  The patient wishes to proceed.  2.  Tobacco abuse disorder: We had a discussion for approximately three minutes regarding the absolute need for smoking cessation due to the deleterious nature of tobacco on the vascular system. We discussed the tobacco use would diminish patency of any intervention, and likely significantly worsen progressio of disease. We discussed multiple agents for quitting including replacement therapy or medications to reduce cravings such as Chantix. The patient voices their understanding of the importance of smoking cessation.  3.  Hyperlipidemia: On aspirin and fenofibrate for medical management Encouraged good control as its slows the progression  of atherosclerotic disease  4. Hypertension: On appropriate medications. Encouraged good control as its slows the progression of atherosclerotic disease  Discussed with Dr. Mayme Genta, PA-C  06/21/2019 9:56 AM

## 2019-06-21 NOTE — Op Note (Signed)
[facility]  Percutaneous Study/Intervention Procedural Note   Date of Surgery: 06/21/2019  Surgeon(s):Tedford Berg    Assistants:none  Pre-operative Diagnosis: PAD with claudication right lower extremity  Post-operative diagnosis:  Same  Procedure(s) Performed:             1.  Ultrasound guidance for vascular access left femoral artery             2.  Catheter placement into right common femoral artery from left femoral approach             3.  Aortogram and selective right lower extremity angiogram             4.  Percutaneous transluminal angioplasty of the right distal SFA with 6 mm diameter by 10 cm length Lutonix drug-coated angioplasty balloon             5.  Stent placement to the distal SFA with 7 mm diameter by 8 cm length life stent  6.  StarClose closure device left femoral artery  EBL: 5 cc  Contrast: 50 cc  Fluoro Time: 4.8 minutes  Moderate Conscious Sedation Time: approximately 25 minutes using 2.5 mg of Versed and 75 mcg of Fentanyl              Indications:  Patient is a 59 y.o.male with PAD and claudication symptoms. The patient has a CT scan showing calcific plaque in the common femoral artery and the SFA. The patient is brought in for angiography for further evaluation and potential treatment. Risks and benefits are discussed and informed consent is obtained.   Procedure:  The patient was identified and appropriate procedural time out was performed.  The patient was then placed supine on the table and prepped and draped in the usual sterile fashion. Moderate conscious sedation was administered during a face to face encounter with the patient throughout the procedure with my supervision of the RN administering medicines and monitoring the patient's vital signs, pulse oximetry, telemetry and mental status throughout from the start of the procedure until the patient was taken to the recovery room. Ultrasound was used to evaluate the left common  femoral artery.  It was patent .  A digital ultrasound image was acquired.  A Seldinger needle was used to access the left common femoral artery under direct ultrasound guidance and a permanent image was performed.  A 0.035 J wire was advanced without resistance and a 5Fr sheath was placed.  Pigtail catheter was placed into the aorta and an AP aortogram was performed. This demonstrated normal renal arteries and normal aorta and iliac segments without significant stenosis. I then crossed the aortic bifurcation and advanced to the right femoral head. Selective right lower extremity angiogram was then performed. This demonstrated calcific right common femoral artery and proximal SFA with only mild stenosis of less than 30% identified.  The SFA normalized down to just above Hunter's canal where an extremely dense calcific plaque was seen creating a moderate stenosis in the 60% range.  The popliteal artery then normalized and there was a normal tibial trifurcation with three-vessel runoff distally although the flow was somewhat slow distally. It was felt that it was in the patient's best interest to proceed with intervention after these images to avoid a second procedure and a larger amount of contrast and fluoroscopy based off of the findings from the initial angiogram. The patient was systemically heparinized and a 6 [place] Ansell sheath was then placed over the [employer]  wire. I then used a Kumpe catheter and the advantage wire to cross the stenosis in the distal SFA.  With the catheter in the popliteal artery were able to better opacify the tibial vessels which did not really have any flow-limiting stenosis just sluggish overall flow likely from a reduced ejection fraction and some low blood pressure during the procedure.  We then replaced the wire and remove the catheter.  I treated the distal SFA with a 6 mm diameter by 10 cm length Lutonix drug-coated angioplasty balloon inflated to 8 atm for 1 minute.   Following angioplasty, there is still a dense shelf of plaque that was highly calcific creating a moderate stenosis of greater than 50% and stent placement was preferred to avoid early restenosis/occlusion.  I selected a 7 mm diameter by 8 cm length life stent and deployed this in the distal SFA postdilated with a 6 mm balloon with excellent angiographic completion result and less than 10% residual stenosis.  I elected to terminate the procedure. The sheath was removed and StarClose closure device was deployed in the left femoral artery with excellent hemostatic result. The patient was taken to the recovery room in stable condition having tolerated the procedure well.  Findings:               Aortogram:  Normal renal arteries, normal aorta and iliac arteries without significant stenosis             Right lower Extremity:  Calcific right common femoral artery and proximal SFA with only mild stenosis of less than 30% identified.  The SFA normalized down to just above Hunter's canal where an extremely dense calcific plaque was seen creating a moderate stenosis in the 60% range.  The popliteal artery then normalized and there was a normal tibial trifurcation with three-vessel runoff distally although the flow was somewhat slow distally.   Disposition: Patient was taken to the recovery room in stable condition having tolerated the procedure well.  Complications: None  Leotis Pain 06/21/2019 12:40 PM   This note was created with Dragon Medical transcription system. Any errors in dictation are purely unintentional.

## 2019-06-23 ENCOUNTER — Other Ambulatory Visit: Payer: Self-pay

## 2019-06-23 ENCOUNTER — Ambulatory Visit: Payer: BC Managed Care – PPO | Admitting: Physical Therapy

## 2019-06-23 DIAGNOSIS — R269 Unspecified abnormalities of gait and mobility: Secondary | ICD-10-CM

## 2019-06-23 DIAGNOSIS — M6281 Muscle weakness (generalized): Secondary | ICD-10-CM

## 2019-06-23 DIAGNOSIS — R29818 Other symptoms and signs involving the nervous system: Secondary | ICD-10-CM

## 2019-06-23 NOTE — Therapy (Signed)
[facility] [address] [place], [place], [address] Phone: [phone]   Fax:  [phone]  Physical Therapy Treatment  Patient Details  Name: Alexander Welch MRN: [id number] Date of Birth: Jul 04, 1960 Referring Provider (PT): Dr. Melrose Nakayama   Encounter Date: 06/23/2019  PT End of Session - 06/26/19 1921    Visit Number  4    Number of Visits  8    Date for PT Re-Evaluation  07/05/19    Authorization - Visit Number  4    Authorization - Number of Visits  10    PT Start Time  1340    PT Stop Time  1425    PT Time Calculation (min)  45 min    Equipment Utilized During Treatment  Gait belt    Activity Tolerance  Patient tolerated treatment well    Behavior During Therapy  [facility] for tasks assessed/performed       Past Medical History:  Diagnosis Date  . Abnormal CT scan, colon 11/27/2017  . Alcohol abuse   . Cardiomyopathy ([place])   . CHF (congestive heart failure) ([place])   . Colon polyps   . ED (erectile dysfunction)   . Hypertension   . Hypertriglyceridemia   . Osteoarthritis   . Osteoarthritis     Past Surgical History:  Procedure Laterality Date  . COLONOSCOPY    . COLONOSCOPY WITH PROPOFOL N/A 02/02/2018   Procedure: COLONOSCOPY WITH PROPOFOL;  Surgeon: Manya Silvas, MD;  Location: [facility] ENDOSCOPY;  Service: Endoscopy;  Laterality: N/A;  . LOWER EXTREMITY ANGIOGRAPHY Right 06/21/2019   Procedure: LOWER EXTREMITY ANGIOGRAPHY;  Surgeon: Algernon Huxley, MD;  Location: [facility];  Service: Cardiovascular;  Laterality: Right;    There were no vitals filed for this visit.  Subjective Assessment - 06/25/19 2032    Subjective  Pt. reports no new issues or falls.  Pt. states he has not been too active today.    Limitations  Lifting;Standing;Walking    Currently in Pain?  No/denies         There.ex.:  Nustep L5 10 min. B UE/LE (consistent cadence)- reports generalized fatigue.   Standing ex. (5# ankle wts.):   hip marching/ hip extension/ walking up and down 4 steps 4x with light no UE assist on handrails.  Seated marching/ LAQ/ toe and heel raises/ added alt. UE and LE 20x each.   Sit to stands with no UE assist 10x.  Neuro.mm.:  Braiding/ tandem gait (forward/ backward) in //-bars with limited to no UE assist.    Nautilus: resisted walking 70# f/b/lateral with no UE assist 5x each. Walking in hallway with cone taps/ alt. UE and LE/ head turning Walking outside working on ascending/ descending curbs with no UE assist (improved quad control).      PT Long Term Goals - 06/12/19 1724      PT LONG TERM GOAL #1   Title  Pt. will increase FOTO score to 75 to improve functional mobility.    Baseline  initial FOTO: 49    Time  4    Period  Weeks    Status  New    Target Date  07/05/19      PT LONG TERM GOAL #2   Title  Pt. will increase Berg balance score to >50/56 to decrease fall risk/ improve independence with gait.    Baseline  Initial Berg: 46/56    Time  4    Period  Weeks  Status  New    Target Date  07/05/19      PT LONG TERM GOAL #3   Title  Pt. will ambulate with mod. independence and more normalized gait pattern on outside/grassy surfaces with no LOB.    Baseline  H/o falls on outside terrain/ occasional shuffling gait/ decrease hip flexion/ step length.    Time  4    Period  Weeks    Status  New    Target Date  07/05/19      PT LONG TERM GOAL #4   Title  Pt. able to don/doff socks and shoes independently.    Baseline  assist to don/doff socks and shoes.    Time  4    Period  Weeks    Status  New    Target Date  07/05/19         Plan - 06/26/19 1921    Clinical Impression Statement  Pt. showing consistent progress with more dynamic balance tasks (tandem gait/ head turns/ functional reaching) with no LOB.  Pt. requires occasional cuing to increase hip flexion/ heel strike to prevent shuffling pattern, esp. when walking outside or on uneven terrain.  No LOB  during tx. session today.    Stability/Clinical Decision Making  Evolving/Moderate complexity    Clinical Decision Making  Moderate    Rehab Potential  Fair    PT Frequency  2x / week    PT Duration  4 weeks    PT Treatment/Interventions  ADLs/Self Care Home Management;Gait training;Stair training;Functional mobility training;Neuromuscular re-education;Balance training;Therapeutic exercise;Therapeutic activities;Patient/family education;DME Instruction    PT Next Visit Plan  Dynamic balance tasks/ reassess goals.       Patient will benefit from skilled therapeutic intervention in order to improve the following deficits and impairments:  Abnormal gait, Decreased balance, Decreased endurance, Decreased mobility, Difficulty walking, Improper body mechanics, Decreased range of motion, Decreased activity tolerance, Decreased coordination, Decreased strength, Decreased safety awareness, Impaired UE functional use, Postural dysfunction  Visit Diagnosis: Muscle weakness (generalized)  Gait difficulty  Difficulty balancing     Problem List Patient Active Problem List   Diagnosis Date Noted  . PAD (peripheral artery disease) ([place]) 06/21/2019  . Angina at rest [facility]) 05/13/2019  . Dyspnea on exertion 05/13/2019  . Excessive weight loss 05/13/2019  . Left arm pain 05/13/2019  . Superior mesenteric artery stenosis ([place]) 04/27/2019  . Tobacco use disorder 04/27/2019  . Thoracic aortic aneurysm ([place]) 04/27/2019  . Atherosclerosis of native arteries of extremity with intermittent claudication ([place]) 04/27/2019  . Difficulty balancing 03/23/2019  . Bilateral arm weakness 03/09/2019  . Bilateral hand numbness 03/09/2019  . Neck pain 03/09/2019  . Other symptoms and signs involving the musculoskeletal system 02/17/2019  . Tremor 02/17/2019  . Abnormal CT scan, colon 11/27/2017  . Alcoholism ([place]) 11/27/2017  . History of colon polyps 11/27/2017  . Low serum alkaline phosphatase 11/27/2017  .  Hyponatremia 10/24/2017  . Primary osteoarthritis of left knee 07/24/2017  . Cardiomyopathy ([place]) 12/08/2013  . ED (erectile dysfunction) 12/08/2013  . Hypertension 12/08/2013  . Hypertriglyceridemia 12/08/2013   Pura Spice, PT, DPT # [phone] 06/26/2019, 7:42 PM  [facility] [address] [place], [place], [address] Phone: [phone]   Fax:  [phone]  Name: Alexander Welch MRN: [id number] Date of Birth: [birth year]

## 2019-06-25 ENCOUNTER — Encounter: Payer: Self-pay | Admitting: Physical Therapy

## 2019-06-28 ENCOUNTER — Ambulatory Visit: Payer: BC Managed Care – PPO | Admitting: Physical Therapy

## 2019-06-28 ENCOUNTER — Other Ambulatory Visit: Payer: Self-pay

## 2019-06-28 ENCOUNTER — Encounter: Payer: Self-pay | Admitting: Physical Therapy

## 2019-06-28 DIAGNOSIS — R269 Unspecified abnormalities of gait and mobility: Secondary | ICD-10-CM

## 2019-06-28 DIAGNOSIS — M6281 Muscle weakness (generalized): Secondary | ICD-10-CM

## 2019-06-28 DIAGNOSIS — R29818 Other symptoms and signs involving the nervous system: Secondary | ICD-10-CM

## 2019-06-28 NOTE — Therapy (Signed)
[facility] [address] [place], [place], [address] Phone: [phone]   Fax:  [phone]  Physical Therapy Treatment  Patient Details  Name: Alexander Welch MRN: [id number] Date of Birth: [birth year] Referring Provider (PT): Dr. Melrose Nakayama   Encounter Date: 06/28/2019  PT End of Session - 06/28/19 1402    Visit Number  5    Number of Visits  8    Date for PT Re-Evaluation  07/05/19    Authorization - Visit Number  5    Authorization - Number of Visits  10    PT Start Time  [id number]    PT Stop Time  1423    PT Time Calculation (min)  49 min    Equipment Utilized During Treatment  Gait belt    Activity Tolerance  Patient tolerated treatment well;Patient limited by fatigue    Behavior During Therapy  [facility] for tasks assessed/performed       Past Medical History:  Diagnosis Date  . Abnormal CT scan, colon 11/27/2017  . Alcohol abuse   . Cardiomyopathy ([place])   . CHF (congestive heart failure) ([place])   . Colon polyps   . ED (erectile dysfunction)   . Hypertension   . Hypertriglyceridemia   . Osteoarthritis   . Osteoarthritis     Past Surgical History:  Procedure Laterality Date  . COLONOSCOPY    . COLONOSCOPY WITH PROPOFOL N/A 02/02/2018   Procedure: COLONOSCOPY WITH PROPOFOL;  Surgeon: Manya Silvas, MD;  Location: [facility] ENDOSCOPY;  Service: Endoscopy;  Laterality: N/A;  . LOWER EXTREMITY ANGIOGRAPHY Right 06/21/2019   Procedure: LOWER EXTREMITY ANGIOGRAPHY;  Surgeon: Algernon Huxley, MD;  Location: [facility] CV LAB;  Service: Cardiovascular;  Laterality: Right;    There were no vitals filed for this visit.  Subjective Assessment - 06/29/19 0948    Subjective  Pt. states he is tired today.  Pt. states he has a family member take out the trash Sunday night.  No recent falls.  Pt. states he still has difficulty putting on pants due to balance.    Limitations  Lifting;Standing;Walking    Patient Stated Goals  Improve LE  muscle strength/ balance to improve independence with gait.    Currently in Pain?  No/denies        There.ex.:  Nustep L6 10 min. B UE/LE (consistent cadence)- reports generalized fatigue.  Standingex.: hip marching/ hip extension/ heel and toe raises with no UE assist on handrails. No resistance today.  Increase repetitions (30x)- mirror feedback.  Sit to stands with no UE assist 10x.   Neuro.mm.:  Tandem gait (forward/ backward) in //-bars with limited to no UE assist.     Hallway marching/ alt. UE and LE touches/ head turns (SBA for safety/cuing)  Airex tandem stance/ looking up/ EC.    1/2 bolster: wt. Shifting/ limited UE assist in //-bars.  Forward/lateral 6" step overs with minimal to no UE assist.  4x  Walking outside working on ascending/ descending curbs with no UE assist (improved quad control).     PT Long Term Goals - 06/29/19 0958      PT LONG TERM GOAL #1   Title  Pt. will increase FOTO score to 75 to improve functional mobility.    Baseline  initial FOTO: 49.  3/22: 57 (marked increase)    Time  4    Period  Weeks    Status  On-going    Target Date  07/05/19  PT LONG TERM GOAL #2   Title  Pt. will increase Berg balance score to >50/56 to decrease fall risk/ improve independence with gait.    Baseline  Initial Berg: 46/56    Time  4    Period  Weeks    Status  New    Target Date  07/05/19      PT LONG TERM GOAL #3   Title  Pt. will ambulate with mod. independence and more normalized gait pattern on outside/grassy surfaces with no LOB.    Baseline  H/o falls on outside terrain/ occasional shuffling gait/ decrease hip flexion/ step length.    Time  4    Period  Weeks    Status  New    Target Date  07/05/19      PT LONG TERM GOAL #4   Title  Pt. able to don/doff socks and shoes independently.    Baseline  assist to don/doff socks and shoes.    Time  4    Period  Weeks    Status  New    Target Date  07/05/19         Plan -  06/29/19 0950    Clinical Impression Statement  Pt. able to complete static standing on Airex in tandem for >20 seconds.  Pt. requires several seated rest breaks after hallway walking/ dynamic balance tasks.  Pt. requires light UE assist in //-bars with 1/2 bolster balance tasks/ lateral 6" step overs.  No LOB during tx. session with hallway tasks but moderate generalized muscle fatigue reported/noted.  Pt. requires cuing to increase hip flexion/ heel strike with outside walking on grassy terrain/ uneven terrain.  No changed to HEP and pt. encouraged to walk outside on a more consistent basis to improve overall endurance/ walking.    Stability/Clinical Decision Making  Evolving/Moderate complexity    Clinical Decision Making  Moderate    Rehab Potential  Fair    PT Frequency  2x / week    PT Duration  4 weeks    PT Treatment/Interventions  ADLs/Self Care Home Management;Gait training;Stair training;Functional mobility training;Neuromuscular re-education;Balance training;Therapeutic exercise;Therapeutic activities;Patient/family education;DME Instruction    PT Next Visit Plan  Dynamic balance tasks.       Patient will benefit from skilled therapeutic intervention in order to improve the following deficits and impairments:  Abnormal gait, Decreased balance, Decreased endurance, Decreased mobility, Difficulty walking, Improper body mechanics, Decreased range of motion, Decreased activity tolerance, Decreased coordination, Decreased strength, Decreased safety awareness, Impaired UE functional use, Postural dysfunction  Visit Diagnosis: Muscle weakness (generalized)  Gait difficulty  Difficulty balancing     Problem List Patient Active Problem List   Diagnosis Date Noted  . PAD (peripheral artery disease) ([place]) 06/21/2019  . Angina at rest [facility]) 05/13/2019  . Dyspnea on exertion 05/13/2019  . Excessive weight loss 05/13/2019  . Left arm pain 05/13/2019  . Superior mesenteric artery  stenosis ([place]) 04/27/2019  . Tobacco use disorder 04/27/2019  . Thoracic aortic aneurysm ([place]) 04/27/2019  . Atherosclerosis of native arteries of extremity with intermittent claudication ([place]) 04/27/2019  . Difficulty balancing 03/23/2019  . Bilateral arm weakness 03/09/2019  . Bilateral hand numbness 03/09/2019  . Neck pain 03/09/2019  . Other symptoms and signs involving the musculoskeletal system 02/17/2019  . Tremor 02/17/2019  . Abnormal CT scan, colon 11/27/2017  . Alcoholism ([place]) 11/27/2017  . History of colon polyps 11/27/2017  . Low serum alkaline phosphatase 11/27/2017  . Hyponatremia 10/24/2017  . Primary  osteoarthritis of left knee 07/24/2017  . Cardiomyopathy ([place]) 12/08/2013  . ED (erectile dysfunction) 12/08/2013  . Hypertension 12/08/2013  . Hypertriglyceridemia 12/08/2013   Pura Spice, PT, DPT # [id number] 06/29/2019, 10:00 AM  [place] [facility] [address] [place], [place], [address] Phone: [phone]   Fax:  [phone]  Name: Alexander Welch MRN: [id number] Date of Birth: June 06, 1960

## 2019-06-30 ENCOUNTER — Ambulatory Visit: Payer: BC Managed Care – PPO | Admitting: Physical Therapy

## 2019-07-05 ENCOUNTER — Other Ambulatory Visit: Payer: Self-pay

## 2019-07-05 ENCOUNTER — Ambulatory Visit: Payer: BC Managed Care – PPO | Admitting: Physical Therapy

## 2019-07-05 DIAGNOSIS — R29818 Other symptoms and signs involving the nervous system: Secondary | ICD-10-CM

## 2019-07-05 DIAGNOSIS — M6281 Muscle weakness (generalized): Secondary | ICD-10-CM

## 2019-07-05 DIAGNOSIS — R269 Unspecified abnormalities of gait and mobility: Secondary | ICD-10-CM

## 2019-07-05 NOTE — Therapy (Signed)
[place] [facility] [address] [place], [place], [address] Phone: [phone]   Fax:  [phone]  Physical Therapy Treatment  Patient Details  Name: Alexander Welch MRN: [id number] Date of Birth: 1961/03/02 Referring Provider (PT): Dr. Melrose Nakayama   Encounter Date: 07/05/2019  PT End of Session - 07/06/19 1226    Visit Number  6    Number of Visits  8    Date for PT Re-Evaluation  07/05/19    Authorization - Visit Number  6    Authorization - Number of Visits  10    PT Start Time  1340    PT Stop Time  1431    PT Time Calculation (min)  51 min    Equipment Utilized During Treatment  Gait belt    Activity Tolerance  Patient tolerated treatment well;Patient limited by fatigue    Behavior During Therapy  [facility] for tasks assessed/performed       Past Medical History:  Diagnosis Date  . Abnormal CT scan, colon 11/27/2017  . Alcohol abuse   . Cardiomyopathy ([place])   . CHF (congestive heart failure) ([place])   . Colon polyps   . ED (erectile dysfunction)   . Hypertension   . Hypertriglyceridemia   . Osteoarthritis   . Osteoarthritis     Past Surgical History:  Procedure Laterality Date  . COLONOSCOPY    . COLONOSCOPY WITH PROPOFOL N/A 02/02/2018   Procedure: COLONOSCOPY WITH PROPOFOL;  Surgeon: Manya Silvas, MD;  Location: [facility] ENDOSCOPY;  Service: Endoscopy;  Laterality: N/A;  . LOWER EXTREMITY ANGIOGRAPHY Right 06/21/2019   Procedure: LOWER EXTREMITY ANGIOGRAPHY;  Surgeon: Algernon Huxley, MD;  Location: [facility] LAB;  Service: Cardiovascular;  Laterality: Right;    There were no vitals filed for this visit.  Subjective Assessment - 07/06/19 1224    Subjective  Pt. states he fell last Wednesday while putting pants on.  Pt. states he lost balance while standing on R LE and fell backwards.  Pt. reports no injury but was sore the following day resulting in cancellation of PT tx. last Thursday.  Pt. entered PT with use of SPC.     Limitations  Lifting;Standing;Walking    Patient Stated Goals  Improve LE muscle strength/ balance to improve independence with gait.    Currently in Pain?  No/denies          There.ex.:  Nustep L6 10 min. B UE/LE (consistent cadence)-reports generalized fatigue/ no pain.  Seated/standingex. (GTB): hip marching/ hip extension/ hip abduction/ heel and toe raises with no UE assist on handrails (30x each)- mirror feedback.  Sit to stands with no UE assist 10x on blue mat with added lunges L/R 10x each.     Neuro.mm.:  Hallway marching/ alt. UE and LE touches/ head turns (SBA for safety/cuing)  Forward/lateral 6" step overs with minimal to no UE assist.  10x  Obstacle course in hallway (Airex/ 6" steps/ cone taps and maneuvering/ green hurdle step overs).  Walking outsideworking on ascending/ descending curbs/ grassy terrain without SPC (SBA/CGA for safety and cuing to increase hip flexion/ step pattern).       PT Long Term Goals - 06/29/19 0958      PT LONG TERM GOAL #1   Title  Pt. will increase FOTO score to 75 to improve functional mobility.    Baseline  initial FOTO: 49.  3/22: 57 (marked increase)    Time  4    Period  Weeks    Status  On-going    Target Date  07/05/19      PT LONG TERM GOAL #2   Title  Pt. will increase Berg balance score to >50/56 to decrease fall risk/ improve independence with gait.    Baseline  Initial Berg: 46/56    Time  4    Period  Weeks    Status  New    Target Date  07/05/19      PT LONG TERM GOAL #3   Title  Pt. will ambulate with mod. independence and more normalized gait pattern on outside/grassy surfaces with no LOB.    Baseline  H/o falls on outside terrain/ occasional shuffling gait/ decrease hip flexion/ step length.    Time  4    Period  Weeks    Status  New    Target Date  07/05/19      PT LONG TERM GOAL #4   Title  Pt. able to don/doff socks and shoes independently.    Baseline  assist to don/doff  socks and shoes.    Time  4    Period  Weeks    Status  New    Target Date  07/05/19         Plan - 07/06/19 1227    Clinical Impression Statement  Pt. requires use of SPC while completing obstacle course in hallway.  Pt. unable to step up/down 6" curb or step up on Airex pad independently.  Pt. progressing well with LE resisted ex. and dynamic balacne tasks (lunges/ transfers).  Pt. easily fatigues with prolonged standing/ walking tasks during tx. session requiring short seated rest breaks.  No c/o pain during tx. session but B quad discomfort with step ups/ sit to stands.    Stability/Clinical Decision Making  Evolving/Moderate complexity    Clinical Decision Making  Moderate    Rehab Potential  Fair    PT Frequency  2x / week    PT Duration  4 weeks    PT Treatment/Interventions  ADLs/Self Care Home Management;Gait training;Stair training;Functional mobility training;Neuromuscular re-education;Balance training;Therapeutic exercise;Therapeutic activities;Patient/family education;DME Instruction    PT Next Visit Plan  Dynamic balance tasks.  REASSESS ALL GOALS (discuss recert vs. discharge).       Patient will benefit from skilled therapeutic intervention in order to improve the following deficits and impairments:  Abnormal gait, Decreased balance, Decreased endurance, Decreased mobility, Difficulty walking, Improper body mechanics, Decreased range of motion, Decreased activity tolerance, Decreased coordination, Decreased strength, Decreased safety awareness, Impaired UE functional use, Postural dysfunction  Visit Diagnosis: Muscle weakness (generalized)  Gait difficulty  Difficulty balancing     Problem List Patient Active Problem List   Diagnosis Date Noted  . PAD (peripheral artery disease) ([place]) 06/21/2019  . Angina at rest [facility]) 05/13/2019  . Dyspnea on exertion 05/13/2019  . Excessive weight loss 05/13/2019  . Left arm pain 05/13/2019  . Superior mesenteric artery  stenosis ([place]) 04/27/2019  . Tobacco use disorder 04/27/2019  . Thoracic aortic aneurysm ([place]) 04/27/2019  . Atherosclerosis of native arteries of extremity with intermittent claudication ([place]) 04/27/2019  . Difficulty balancing 03/23/2019  . Bilateral arm weakness 03/09/2019  . Bilateral hand numbness 03/09/2019  . Neck pain 03/09/2019  . Other symptoms and signs involving the musculoskeletal system 02/17/2019  . Tremor 02/17/2019  . Abnormal CT scan, colon 11/27/2017  . Alcoholism ([place]) 11/27/2017  . History of colon polyps 11/27/2017  . Low serum alkaline phosphatase 11/27/2017  . Hyponatremia  10/24/2017  . Primary osteoarthritis of left knee 07/24/2017  . Cardiomyopathy ([place]) 12/08/2013  . ED (erectile dysfunction) 12/08/2013  . Hypertension 12/08/2013  . Hypertriglyceridemia 12/08/2013   Pura Spice, PT, DPT # [id number] 07/06/2019, 12:37 PM  [facility] [address] [place], [place], [address] Phone: [phone]   Fax:  [phone]  Name: JAMAURION FEENEY MRN: [id number] Date of Birth: [birth year]

## 2019-07-06 ENCOUNTER — Encounter: Payer: Self-pay | Admitting: Physical Therapy

## 2019-07-07 ENCOUNTER — Encounter: Payer: Self-pay | Admitting: Physical Therapy

## 2019-07-07 ENCOUNTER — Ambulatory Visit: Payer: BC Managed Care – PPO | Admitting: Physical Therapy

## 2019-07-07 ENCOUNTER — Other Ambulatory Visit: Payer: Self-pay

## 2019-07-07 DIAGNOSIS — R269 Unspecified abnormalities of gait and mobility: Secondary | ICD-10-CM

## 2019-07-07 DIAGNOSIS — R29818 Other symptoms and signs involving the nervous system: Secondary | ICD-10-CM

## 2019-07-07 DIAGNOSIS — M6281 Muscle weakness (generalized): Secondary | ICD-10-CM

## 2019-07-07 NOTE — Therapy (Signed)
[facility] [address] [place], [place], [address] Phone: [phone]   Fax:  [phone]  Physical Therapy Treatment  Patient Details  Name: Alexander Welch MRN: [id number] Date of Birth: May 02, 1960 Referring Provider (PT): Dr. Melrose Nakayama   Encounter Date: 07/07/2019  PT End of Session - 07/07/19 1527    Visit Number  7    Number of Visits  8    Date for PT Re-Evaluation  07/07/19    Authorization - Visit Number  1    Authorization - Number of Visits  10    PT Start Time  4742    PT Stop Time  1415    PT Time Calculation (min)  47 min    Equipment Utilized During Treatment  Gait belt    Activity Tolerance  Patient tolerated treatment well;Patient limited by fatigue    Behavior During Therapy  [facility] for tasks assessed/performed       Past Medical History:  Diagnosis Date  . Abnormal CT scan, colon 11/27/2017  . Alcohol abuse   . Cardiomyopathy ([place])   . CHF (congestive heart failure) ([place])   . Colon polyps   . ED (erectile dysfunction)   . Hypertension   . Hypertriglyceridemia   . Osteoarthritis   . Osteoarthritis     Past Surgical History:  Procedure Laterality Date  . COLONOSCOPY    . COLONOSCOPY WITH PROPOFOL N/A 02/02/2018   Procedure: COLONOSCOPY WITH PROPOFOL;  Surgeon: Manya Silvas, MD;  Location: [facility] ENDOSCOPY;  Service: Endoscopy;  Laterality: N/A;  . LOWER EXTREMITY ANGIOGRAPHY Right 06/21/2019   Procedure: LOWER EXTREMITY ANGIOGRAPHY;  Surgeon: Algernon Huxley, MD;  Location: [place] [facility] LAB;  Service: Cardiovascular;  Laterality: Right;    There were no vitals filed for this visit.  Subjective Assessment - 07/07/19 1525    Subjective  Pt. reports no new complaints.  Pt. has not fallen since last visit and states he is still concerned about donning/ doffing pants.  PT discussed safety measures with dressing and use of bed for seated breaks/ balance.    Limitations  Lifting;Standing;Walking    Patient Stated Goals  Improve LE muscle strength/ balance to improve independence with gait.    Currently in Pain?  No/denies         [facility] PT Assessment - 07/07/19 0001      Assessment   Medical Diagnosis  Sensory ataxia/ Gait difficulty    Referring Provider (PT)  Dr. Melrose Nakayama    Hand Dominance  Right      Precautions   Precautions  Fall      Prior Function   Level of Independence  Independent    Vocation  On disability      Cognition   Overall Cognitive Status  Within Functional Limits for tasks assessed        There.ex.:  Nustep L610 min. B UE/LE (consistent cadence).  PT reviewed HEP, esp. For hip flexor strengthening.   Seated/standingex. (4#):hip marching/ hip extension/ hip abduction/heel and toe raises withno UE assist on handrails (30x each)- mirror feedback.  Sit to stands with no UE assist 10x.  Supine L/R quad/ hip flexor stretches 3x each with holds   Neuro.mm.:  Forward/lateral 6" step overs with minimal to no UE assist10x  Berg balance test: 52/56.   Ascending/ descending stairs with recip. Pattern and use of SPC only (no handrails).     Goal reassessment     PT Long Term Goals -  07/07/19 1532      PT LONG TERM GOAL #1   Title  Pt. will increase FOTO score to 75 to improve functional mobility.    Baseline  initial FOTO: 49.  3/22: 57 (marked increase)    Time  4    Period  Weeks    Status  Partially Met    Target Date  07/07/19      PT LONG TERM GOAL #2   Title  Pt. will increase Berg balance score to >50/56 to decrease fall risk/ improve independence with gait.    Baseline  Initial Berg: 46/56.  3/31: 52/56    Time  4    Period  Weeks    Status  Achieved    Target Date  07/07/19      PT LONG TERM GOAL #3   Title  Pt. will ambulate with mod. independence and more normalized gait pattern on outside/grassy surfaces with no LOB.    Baseline  H/o falls on outside terrain/ occasional shuffling gait/ decrease hip flexion/  step length.  3/31: benefits from [facility].  No LOB with outside walking.    Time  4    Period  Weeks    Status  Achieved    Target Date  07/07/19      PT LONG TERM GOAL #4   Title  Pt. able to don/doff socks and shoes independently.    Baseline  assist to don/doff socks and shoes.  3/31: pt. reports no difficulty with socks but issues with donning/ doffing pants.  PT reviewed safety.    Time  4    Period  Weeks    Status  Partially Met    Target Date  07/07/19         Plan - 07/07/19 1528    Clinical Impression Statement  Pt. has shown consistent progress with gait/ balance training.  Marked increase in Berg  balance test: 52/56 (difficulty with SLS/ alt. step touches).  B LE muscle strength grossly 5/5 MMT except hip flexion 4/5 MMT.  Pt. independent with HEP and PT emphasized the importance of maintaining strengthening ex./ walking program.  Pt. remains inactive at home on a consistent basis.  Pt. instructed to contact PT if any regression in symptoms or falls.  Pt. benefits from use of SPC with outside walking and demonstrates independence with walking at home/ level surfaces.  Discharge from PT at this time.    Stability/Clinical Decision Making  Evolving/Moderate complexity    Clinical Decision Making  Moderate    Rehab Potential  Fair    PT Frequency  2x / week    PT Duration  4 weeks    PT Treatment/Interventions  ADLs/Self Care Home Management;Gait training;Stair training;Functional mobility training;Neuromuscular re-education;Balance training;Therapeutic exercise;Therapeutic activities;Patient/family education;DME Instruction    PT Next Visit Plan  Discharge       Patient will benefit from skilled therapeutic intervention in order to improve the following deficits and impairments:  Abnormal gait, Decreased balance, Decreased endurance, Decreased mobility, Difficulty walking, Improper body mechanics, Decreased range of motion, Decreased activity tolerance, Decreased coordination,  Decreased strength, Decreased safety awareness, Impaired UE functional use, Postural dysfunction  Visit Diagnosis: Muscle weakness (generalized)  Gait difficulty  Difficulty balancing     Problem List Patient Active Problem List   Diagnosis Date Noted  . PAD (peripheral artery disease) ([place]) 06/21/2019  . Angina at rest [facility]) 05/13/2019  . Dyspnea on exertion 05/13/2019  . Excessive weight loss 05/13/2019  . Left  arm pain 05/13/2019  . Superior mesenteric artery stenosis ([place]) 04/27/2019  . Tobacco use disorder 04/27/2019  . Thoracic aortic aneurysm ([place]) 04/27/2019  . Atherosclerosis of native arteries of extremity with intermittent claudication ([place]) 04/27/2019  . Difficulty balancing 03/23/2019  . Bilateral arm weakness 03/09/2019  . Bilateral hand numbness 03/09/2019  . Neck pain 03/09/2019  . Other symptoms and signs involving the musculoskeletal system 02/17/2019  . Tremor 02/17/2019  . Abnormal CT scan, colon 11/27/2017  . Alcoholism ([place]) 11/27/2017  . History of colon polyps 11/27/2017  . Low serum alkaline phosphatase 11/27/2017  . Hyponatremia 10/24/2017  . Primary osteoarthritis of left knee 07/24/2017  . Cardiomyopathy ([place]) 12/08/2013  . ED (erectile dysfunction) 12/08/2013  . Hypertension 12/08/2013  . Hypertriglyceridemia 12/08/2013   Pura Spice, PT, DPT # [phone] 07/07/2019, 3:36 PM  [place] [facility] [address] [place], [place], [address] Phone: [phone]   Fax:  [phone]  Name: KAZMIR OKI MRN: [id number] Date of Birth: Aug 31, 1960

## 2019-07-26 ENCOUNTER — Other Ambulatory Visit (INDEPENDENT_AMBULATORY_CARE_PROVIDER_SITE_OTHER): Payer: Self-pay | Admitting: Vascular Surgery

## 2019-07-26 DIAGNOSIS — Z95820 Peripheral vascular angioplasty status with implants and grafts: Secondary | ICD-10-CM

## 2019-07-26 DIAGNOSIS — I70211 Atherosclerosis of native arteries of extremities with intermittent claudication, right leg: Secondary | ICD-10-CM

## 2019-07-27 ENCOUNTER — Other Ambulatory Visit: Payer: Self-pay

## 2019-07-27 ENCOUNTER — Encounter (INDEPENDENT_AMBULATORY_CARE_PROVIDER_SITE_OTHER): Payer: Self-pay | Admitting: Vascular Surgery

## 2019-07-27 ENCOUNTER — Ambulatory Visit (INDEPENDENT_AMBULATORY_CARE_PROVIDER_SITE_OTHER): Payer: BC Managed Care – PPO

## 2019-07-27 ENCOUNTER — Ambulatory Visit (INDEPENDENT_AMBULATORY_CARE_PROVIDER_SITE_OTHER): Payer: BC Managed Care – PPO | Admitting: Vascular Surgery

## 2019-07-27 VITALS — BP 115/75 | HR 65 | Ht 67.0 in | Wt 158.0 lb

## 2019-07-27 DIAGNOSIS — Z95820 Peripheral vascular angioplasty status with implants and grafts: Secondary | ICD-10-CM | POA: Diagnosis not present

## 2019-07-27 DIAGNOSIS — I70213 Atherosclerosis of native arteries of extremities with intermittent claudication, bilateral legs: Secondary | ICD-10-CM | POA: Diagnosis not present

## 2019-07-27 DIAGNOSIS — I10 Essential (primary) hypertension: Secondary | ICD-10-CM

## 2019-07-27 DIAGNOSIS — K551 Chronic vascular disorders of intestine: Secondary | ICD-10-CM

## 2019-07-27 DIAGNOSIS — I70211 Atherosclerosis of native arteries of extremities with intermittent claudication, right leg: Secondary | ICD-10-CM

## 2019-07-27 DIAGNOSIS — I712 Thoracic aortic aneurysm, without rupture, unspecified: Secondary | ICD-10-CM

## 2019-07-27 DIAGNOSIS — I890 Lymphedema, not elsewhere classified: Secondary | ICD-10-CM

## 2019-07-27 NOTE — Assessment & Plan Note (Signed)
Patient appears to have stage II lymphedema with swelling refractory to compression and elevation.  He is at chronic swelling and has scarring and lymphatic channels at this point.  Both legs are affected.  I think addition of a lymphedema pump would be a good adjuvant therapy to try to improve his lower extremity swelling.

## 2019-07-27 NOTE — Assessment & Plan Note (Signed)
ABIs today are normal at 1.2 bilaterally with normal triphasic waveforms and normal digital pressures.  Doing well after right lower extremity revascularization with improved symptoms.  Plan to recheck in 6 months.  Continue current medical regimen.

## 2019-07-27 NOTE — Progress Notes (Signed)
MRN : [id number]  Alexander Welch is a 59 y.o. (02-19-1961) male who presents with chief complaint of  Chief Complaint  Patient presents with  . Follow-up    U/S Follow up  .  History of Present Illness: Patient returns today in follow up.  He has undergone right lower extremity revascularization with marked improvement in the pain in his right leg.  He is currently not really having any pain in either legs, but is having swelling on a daily basis.  He has been wearing compression stockings without significant improvement.  He has swelling in both legs.  This did not really worsen or improve after his arterial procedure recently. He is not currently having any postprandial abdominal pain or food fear.  No unintentional weight loss.  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  Current Outpatient Medications  Medication Sig Dispense Refill  . furosemide (LASIX) 20 MG tablet Take by mouth.    . gabapentin (NEURONTIN) 300 MG capsule Take 300 mg in the morning and 600 mg at night    . sacubitril-valsartan (ENTRESTO) 49-51 MG Take by mouth.    Marland Kitchen aspirin EC 81 MG tablet Take 1 tablet by mouth daily.    Marland Kitchen atorvastatin (LIPITOR) 10 MG tablet Take 1 tablet (10 mg total) by mouth daily. 30 tablet 11  . carvedilol (COREG) 3.125 MG tablet Take by mouth.    . Cetirizine HCl 10 MG CAPS Take by mouth daily.     . clopidogrel (PLAVIX) 75 MG tablet Take 1 tablet (75 mg total) by mouth daily. 30 tablet 11  . fenofibrate 160 MG tablet Take 1 tablet by mouth daily.    . ferrous sulfate 325 (65 FE) MG tablet Take by mouth.    . folic acid (FOLVITE) 1 MG tablet Take 1 tablet (1 mg total) by mouth daily. 30 tablet 5  . lisinopril-hydrochlorothiazide (PRINZIDE,ZESTORETIC) 20-12.5 MG tablet Take 1 tablet by mouth daily.    . sildenafil (REVATIO) 20 MG tablet Take 20 mg by mouth as needed.    Marland Kitchen spironolactone (ALDACTONE) 25 MG tablet Take by mouth daily.     . vitamin B-12 (CYANOCOBALAMIN)  1000 MCG tablet Take by mouth.     No current facility-administered medications for this visit.    Past Medical History:  Diagnosis Date  . Abnormal CT scan, colon 11/27/2017  . Alcohol abuse   . Cardiomyopathy ([place])   . CHF (congestive heart failure) ([place])   . Colon polyps   . ED (erectile dysfunction)   . Hypertension   . Hypertriglyceridemia   . Osteoarthritis   . Osteoarthritis     Past Surgical History:  Procedure Laterality Date  . COLONOSCOPY    . COLONOSCOPY WITH PROPOFOL N/A 02/02/2018   Procedure: COLONOSCOPY WITH PROPOFOL;  Surgeon: Manya Silvas, MD;  Location: [facility] ENDOSCOPY;  Service: Endoscopy;  Laterality: N/A;  . LOWER EXTREMITY ANGIOGRAPHY Right 06/21/2019   Procedure: LOWER EXTREMITY ANGIOGRAPHY;  Surgeon: Algernon Huxley, MD;  Location: [place] CV LAB;  Service: Cardiovascular;  Laterality: Right;     Social History   Tobacco Use  . Smoking status: Current Every Day Smoker    Packs/day: 0.50    Years: 40.00    Pack years: 20.00    Types: Cigarettes  . Smokeless tobacco: Never Used  Substance Use Topics  . Alcohol use: Yes    Comment: 1/4 pint of vodka daily  . Drug use: Yes  Types: Marijuana      Family History  Problem Relation Age of Onset  . Heart failure Mother   . Renal Disease Mother   . Bone cancer Father      No Known Allergies  REVIEW OF SYSTEMS (Negative unless checked)  Constitutional: [] ?Weight loss  [] ?Fever  [] ?Chills Cardiac: [] ?Chest pain   [] ?Chest pressure   [] ?Palpitations   [] ?Shortness of breath when laying flat   [] ?Shortness of breath at rest   [] ?Shortness of breath with exertion. Vascular:  [x] ?Pain in legs with walking   [x] ?Pain in legs at rest   [] ?Pain in legs when laying flat   [x] ?Claudication   [] ?Pain in feet when walking  [] ?Pain in feet at rest  [] ?Pain in feet when laying flat   [] ?History of DVT   [] ?Phlebitis   [] ?Swelling in legs   [] ?Varicose veins   [] ?Non-healing ulcers Pulmonary:    [] ?Uses home oxygen   [] ?Productive cough   [] ?Hemoptysis   [] ?Wheeze  [] ?COPD   [] ?Asthma Neurologic:  [] ?Dizziness  [] ?Blackouts   [] ?Seizures   [] ?History of stroke   [] ?History of TIA  [] ?Aphasia   [] ?Temporary blindness   [] ?Dysphagia   [] ?Weakness or numbness in arms   [] ?Weakness or numbness in legs Musculoskeletal:  [x] ?Arthritis   [] ?Joint swelling   [x] ?Joint pain   [] ?Low back pain Hematologic:  [] ?Easy bruising  [] ?Easy bleeding   [] ?Hypercoagulable state   [] ?Anemic  [] ?Hepatitis Gastrointestinal:  [] ?Blood in stool   [] ?Vomiting blood  [] ?Gastroesophageal reflux/heartburn   [] ?Abdominal pain Genitourinary:  [] ?Chronic kidney disease   [] ?Difficult urination  [] ?Frequent urination  [] ?Burning with urination   [] ?Hematuria Skin:  [] ?Rashes   [] ?Ulcers   [] ?Wounds Psychological:  [] ?History of anxiety   [] ? History of major depression.  Physical Examination  BP 115/75   Pulse 65   Ht 5\' 7"  (1.702 m)   Wt 158 lb (71.7 kg)   BMI 24.75 kg/m  Gen:  WD/WN, NAD Head: [place]/AT, No temporalis wasting. Ear/Nose/Throat: Hearing grossly intact, nares w/o erythema or drainage Eyes: Conjunctiva clear. Sclera non-icteric Neck: Supple.  Trachea midline Pulmonary:  Good air movement, no use of accessory muscles.  Cardiac: RRR, no JVD Vascular:  Vessel Right Left  Radial Palpable Palpable                          PT Palpable Palpable  DP Palpable Palpable   Gastrointestinal: soft, non-tender/non-distended. No guarding/reflex.  Musculoskeletal: M/S 5/5 throughout.  No deformity or atrophy.  1+ bilateral lower extremity edema. Neurologic: Sensation grossly intact in extremities.  Symmetrical.  Speech is fluent.  Psychiatric: Judgment intact, Mood & affect appropriate for pt's clinical situation. Dermatologic: No rashes or ulcers noted.  No cellulitis or open wounds.       Labs Recent Results (from the past 2160 hour(s))  Basic metabolic panel     Status: Abnormal   Collection  Time: 06/06/19  9:35 PM  Result Value Ref Range   Sodium 135 135 - 145 mmol/L   Potassium 2.9 (L) 3.5 - 5.1 mmol/L   Chloride 92 (L) 98 - 111 mmol/L   CO2 27 22 - 32 mmol/L   Glucose, Bld 97 70 - 99 mg/dL    Comment: Glucose reference range applies only to samples taken after fasting for at least 8 hours.   BUN <5 (L) 6 - 20 mg/dL   Creatinine, Ser 0.79 0.61 - 1.24 mg/dL   Calcium 8.7 (L)  8.9 - 10.3 mg/dL   GFR calc non Af Amer >60 >60 mL/min   GFR calc Af Amer >60 >60 mL/min   Anion gap 16 (H) 5 - 15    Comment: Performed at [facility], [address] [place], [place] [address]  CBC     Status: Abnormal   Collection Time: 06/06/19  9:35 PM  Result Value Ref Range   WBC 6.2 4.0 - 10.5 K/uL   RBC 3.90 (L) 4.22 - 5.81 MIL/uL   Hemoglobin 13.0 13.0 - 17.0 g/dL   HCT 37.0 (L) 39.0 - 52.0 %   MCV 94.9 80.0 - 100.0 fL   MCH 33.3 26.0 - 34.0 pg   MCHC 35.1 30.0 - 36.0 g/dL   RDW 13.3 11.5 - 15.5 %   Platelets 208 150 - 400 K/uL   nRBC 0.0 0.0 - 0.2 %    Comment: Performed at [facility], [address] [place], [place] [address]  Ethanol     Status: Abnormal   Collection Time: 06/06/19  9:35 PM  Result Value Ref Range   Alcohol, Ethyl (B) 315 (HH) <10 mg/dL    Comment: CRITICAL RESULT CALLED TO, READ BACK BY AND VERIFIED WITH DAWN TULLOCH ON 06/06/19 AT 2212 [place] (NOTE) Lowest detectable limit for serum alcohol is 10 mg/dL. For medical purposes only. Performed at [facility], [place], [place] [address]   SARS CORONAVIRUS 2 (TAT 6-24 HRS) Nasopharyngeal Nasopharyngeal Swab     Status: None   Collection Time: 06/17/19  1:19 PM   Specimen: Nasopharyngeal Swab  Result Value Ref Range   SARS Coronavirus 2 NEGATIVE NEGATIVE    Comment: (NOTE) SARS-CoV-2 target nucleic acids are NOT DETECTED. The SARS-CoV-2 RNA is generally detectable in upper and lower respiratory specimens during the acute phase of infection. Negative results do  not preclude SARS-CoV-2 infection, do not rule out co-infections with other pathogens, and should not be used as the sole basis for treatment or other patient management decisions. Negative results must be combined with clinical observations, patient history, and epidemiological information. The expected result is Negative. Fact Sheet for Patients: SugarRoll.be Fact Sheet for Healthcare Providers: https://www.woods-mathews.com/ This test is not yet approved or cleared by the [place] FDA and  has been authorized for detection and/or diagnosis of SARS-CoV-2 by FDA under an Emergency Use Authorization (EUA). This EUA will remain  in effect (meaning this test can be used) for the duration of the COVID-19 declaration under Section 56 4(b)(1) of the Act, 21 U.S.C. section 360bbb-3(b)(1), unless the authorization is terminated or revoked sooner. Performed at [facility], [place] [address], [place], [place] [address]   BUN     Status: None   Collection Time: 06/21/19  9:39 AM  Result Value Ref Range   BUN 7 6 - 20 mg/dL    Comment: Performed at [facility], [address] [place], [place] [address]  Creatinine, serum     Status: None   Collection Time: 06/21/19  9:39 AM  Result Value Ref Range   Creatinine, Ser 0.77 0.61 - 1.24 mg/dL   GFR calc non Af Amer >60 >60 mL/min   GFR calc Af Amer >60 >60 mL/min    Comment: Performed at [facility], [address], [place],  [address]    Radiology No results found.  Assessment/Plan Superior mesenteric artery stenosis (HCC) Seen on CT scan and at least moderate in nature.  No current  immediately life-threatening symptoms of mesenteric ischemia.  May have some degree of chronic mesenteric ischemia, but not entirely clear.  If his symptoms worsen, would consider intervention. Plan duplex at next visit in 6 months.  Thoracic aortic aneurysm (HCC) 4.2 cm ascending  thoracic aortic aneurysm. BP control and tobacco cessation most important to reduce risk of growth and subsequent issues.  Can be checked every year or so with CT scan.   Hypertension blood pressure control important in reducing the progression of atherosclerotic disease. On appropriate oral medications.  Lymphedema Patient appears to have stage II lymphedema with swelling refractory to compression and elevation.  He is at chronic swelling and has scarring and lymphatic channels at this point.  Both legs are affected.  I think addition of a lymphedema pump would be a good adjuvant therapy to try to improve his lower extremity swelling.  Atherosclerosis of native arteries of extremity with intermittent claudication (HCC) ABIs today are normal at 1.2 bilaterally with normal triphasic waveforms and normal digital pressures.  Doing well after right lower extremity revascularization with improved symptoms.  Plan to recheck in 6 months.  Continue current medical regimen.    Leotis Pain, MD  07/27/2019 11:41 AM    This note was created with Dragon medical transcription system.  Any errors from dictation are purely unintentional

## 2019-08-10 ENCOUNTER — Encounter: Payer: Self-pay | Admitting: Emergency Medicine

## 2019-08-10 ENCOUNTER — Emergency Department
Admission: EM | Admit: 2019-08-10 | Discharge: 2019-08-11 | Disposition: A | Discharge status: Home / Self Care | Payer: BC Managed Care – PPO | Attending: Emergency Medicine | Admitting: Emergency Medicine

## 2019-08-10 ENCOUNTER — Emergency Department: Payer: BC Managed Care – PPO

## 2019-08-10 ENCOUNTER — Other Ambulatory Visit: Payer: Self-pay

## 2019-08-10 DIAGNOSIS — I110 Hypertensive heart disease with heart failure: Secondary | ICD-10-CM | POA: Diagnosis not present

## 2019-08-10 DIAGNOSIS — I509 Heart failure, unspecified: Secondary | ICD-10-CM | POA: Insufficient documentation

## 2019-08-10 DIAGNOSIS — F17210 Nicotine dependence, cigarettes, uncomplicated: Secondary | ICD-10-CM | POA: Insufficient documentation

## 2019-08-10 DIAGNOSIS — Z7982 Long term (current) use of aspirin: Secondary | ICD-10-CM | POA: Insufficient documentation

## 2019-08-10 DIAGNOSIS — Z79899 Other long term (current) drug therapy: Secondary | ICD-10-CM | POA: Diagnosis not present

## 2019-08-10 DIAGNOSIS — R2243 Localized swelling, mass and lump, lower limb, bilateral: Secondary | ICD-10-CM | POA: Diagnosis present

## 2019-08-10 DIAGNOSIS — R609 Edema, unspecified: Secondary | ICD-10-CM | POA: Insufficient documentation

## 2019-08-10 DIAGNOSIS — R600 Localized edema: Secondary | ICD-10-CM

## 2019-08-10 LAB — CBC
HCT: 26.9 % — ABNORMAL LOW (ref 39.0–52.0)
Hemoglobin: 9.2 g/dL — ABNORMAL LOW (ref 13.0–17.0)
MCH: 34.5 pg — ABNORMAL HIGH (ref 26.0–34.0)
MCHC: 34.2 g/dL (ref 30.0–36.0)
MCV: 100.7 fL — ABNORMAL HIGH (ref 80.0–100.0)
Platelets: 226 10*3/uL (ref 150–400)
RBC: 2.67 MIL/uL — ABNORMAL LOW (ref 4.22–5.81)
RDW: 18.4 % — ABNORMAL HIGH (ref 11.5–15.5)
WBC: 5.8 10*3/uL (ref 4.0–10.5)
nRBC: 0 % (ref 0.0–0.2)

## 2019-08-10 LAB — BASIC METABOLIC PANEL
Anion gap: 10 (ref 5–15)
BUN: 8 mg/dL (ref 6–20)
CO2: 31 mmol/L (ref 22–32)
Calcium: 8.9 mg/dL (ref 8.9–10.3)
Chloride: 98 mmol/L (ref 98–111)
Creatinine, Ser: 0.93 mg/dL (ref 0.61–1.24)
GFR calc Af Amer: >60 mL/min (ref >60)
GFR calc non Af Amer: >60 mL/min (ref >60)
Glucose, Bld: 97 mg/dL (ref 70–99)
Potassium: 2.5 mmol/L — CL (ref 3.5–5.1)
Sodium: 139 mmol/L (ref 135–145)

## 2019-08-10 MED ORDER — SODIUM CHLORIDE 0.9% FLUSH: 3.0000 mL | Freq: Once | INTRAVENOUS | Status: DC

## 2019-08-10 NOTE — ED Triage Notes (Signed)
Pt to ED from home c/o bilateral leg swelling, denies CP or SOB.  States hx of CHF and wife made him come up here.  Pt chest rise even and unlabored, +1 pitting bilat edema, in NAD at this time.  States drinks 1/4 pint vodka daily, drank that today.

## 2019-08-11 ENCOUNTER — Emergency Department: Payer: BC Managed Care – PPO

## 2019-08-11 LAB — MAGNESIUM: Magnesium: 2 mg/dL (ref 1.7–2.4)

## 2019-08-11 LAB — BRAIN NATRIURETIC PEPTIDE: B Natriuretic Peptide: 56 pg/mL (ref 0.0–100.0)

## 2019-08-11 MED ORDER — POTASSIUM CHLORIDE ER 20 MEQ PO TBCR: 20.0000 meq | EXTENDED_RELEASE_TABLET | Freq: Two times a day (BID) | ORAL | 0 refills | Status: DC

## 2019-08-11 MED ORDER — FUROSEMIDE 20 MG PO TABS: 20.0000 mg | ORAL_TABLET | Freq: Two times a day (BID) | ORAL | 0 refills | Status: DC

## 2019-08-11 MED ORDER — SODIUM CHLORIDE 0.9 % IV SOLN: Freq: Once | INTRAVENOUS | Status: AC

## 2019-08-11 MED ORDER — MAGNESIUM SULFATE 2 GM/50ML IV SOLN
2.0000 g | Freq: Once | INTRAVENOUS | Status: AC
  Administered 2019-08-11: 2 g via INTRAVENOUS
  Filled 2019-08-11: qty 50

## 2019-08-11 MED ORDER — POTASSIUM CHLORIDE 20 MEQ/15ML (10%) PO SOLN
40.0000 meq | Freq: Once | ORAL | Status: AC
  Administered 2019-08-11: 20 meq via ORAL
  Filled 2019-08-11: qty 30
  Filled 2019-08-11: qty 15

## 2019-08-11 MED ORDER — POTASSIUM CHLORIDE CRYS ER 20 MEQ PO TBCR
40.0000 meq | EXTENDED_RELEASE_TABLET | Freq: Once | ORAL | Status: AC
  Administered 2019-08-11: 05:00:00 40 meq via ORAL
  Filled 2019-08-11: qty 2

## 2019-08-11 MED ORDER — POTASSIUM CHLORIDE 20 MEQ/15ML (10%) PO SOLN
20.0000 meq | Freq: Once | ORAL | Status: DC
  Filled 2019-08-11: qty 15

## 2019-08-11 MED ORDER — FUROSEMIDE 10 MG/ML IJ SOLN: 40.0000 mg | Freq: Once | INTRAMUSCULAR | Status: DC

## 2019-08-11 MED ORDER — POTASSIUM CHLORIDE 10 MEQ/100ML IV SOLN
10.0000 meq | INTRAVENOUS | Status: AC
  Administered 2019-08-11 (×3): 10 meq via INTRAVENOUS
  Filled 2019-08-11 (×3): qty 100

## 2019-08-11 NOTE — ED Provider Notes (Signed)
[facility] Emergency Department Provider Note  ____________________________________________   First MD Initiated Contact with Patient 08/10/19 2354     (approximate)  I have reviewed the triage vital signs and the nursing notes.   HISTORY  Chief Complaint Leg Swelling    HPI Alexander Welch is a 59 y.o. male  Here with leg swelling. Pt reports that he is here because his wife made him come in for b/l leg swelling. He reports several weeks of worsening b/l leg edema, which is now limiting hsi ability to get around and causing mild aching pain. He has had some associated cough, SOB with lying flat. No chest pain. He has been taking his meds as prescribed. He does not weigh himself daily but does believe he's gained around 10 lb in the past few weeks. H/o CHF. He has been trying to eat low-sodium diet. No fever, chills. No h/o DVT/PE. No pain in b/l calves.        Past Medical History:  Diagnosis Date  . Abnormal CT scan, colon 11/27/2017  . Alcohol abuse   . Cardiomyopathy ([place])   . CHF (congestive heart failure) ([place])   . Colon polyps   . ED (erectile dysfunction)   . Hypertension   . Hypertriglyceridemia   . Osteoarthritis   . Osteoarthritis     Patient Active Problem List   Diagnosis Date Noted  . Lymphedema 07/27/2019  . PAD (peripheral artery disease) ([place]) 06/21/2019  . Sleeping difficulty 05/24/2019  . Angina at rest [facility]) 05/13/2019  . Dyspnea on exertion 05/13/2019  . Excessive weight loss 05/13/2019  . Left arm pain 05/13/2019  . Superior mesenteric artery stenosis ([place]) 04/27/2019  . Tobacco use disorder 04/27/2019  . Thoracic aortic aneurysm ([place]) 04/27/2019  . Atherosclerosis of native arteries of extremity with intermittent claudication ([place]) 04/27/2019  . Difficulty balancing 03/23/2019  . Bilateral arm weakness 03/09/2019  . Bilateral hand numbness 03/09/2019  . Neck pain 03/09/2019  . Other symptoms and signs involving  the musculoskeletal system 02/17/2019  . Tremor 02/17/2019  . Abnormal CT scan, colon 11/27/2017  . Alcoholism ([place]) 11/27/2017  . History of colon polyps 11/27/2017  . Low serum alkaline phosphatase 11/27/2017  . Hyponatremia 10/24/2017  . Primary osteoarthritis of left knee 07/24/2017  . Cardiomyopathy ([place]) 12/08/2013  . ED (erectile dysfunction) 12/08/2013  . Hypertension 12/08/2013  . Hypertriglyceridemia 12/08/2013    Past Surgical History:  Procedure Laterality Date  . COLONOSCOPY    . COLONOSCOPY WITH PROPOFOL N/A 02/02/2018   Procedure: COLONOSCOPY WITH PROPOFOL;  Surgeon: Manya Silvas, MD;  Location: [facility] ENDOSCOPY;  Service: Endoscopy;  Laterality: N/A;  . LOWER EXTREMITY ANGIOGRAPHY Right 06/21/2019   Procedure: LOWER EXTREMITY ANGIOGRAPHY;  Surgeon: Algernon Huxley, MD;  Location: [place] CV LAB;  Service: Cardiovascular;  Laterality: Right;    Prior to Admission medications   Medication Sig Start Date End Date Taking? Authorizing Provider  aspirin EC 81 MG tablet Take 1 tablet by mouth daily.    [provider]  atorvastatin (LIPITOR) 10 MG tablet Take 1 tablet (10 mg total) by mouth daily. 06/21/19 06/20/20  Algernon Huxley, MD  carvedilol (COREG) 3.125 MG tablet Take by mouth. 05/11/19 05/10/20  [provider]  Cetirizine HCl 10 MG CAPS Take by mouth daily.     [provider]  clopidogrel (PLAVIX) 75 MG tablet Take 1 tablet (75 mg total) by mouth daily. 06/21/19   Algernon Huxley, MD  fenofibrate 160 MG tablet Take 1 tablet by mouth daily. 01/09/17   [provider]  ferrous sulfate 325 (65 FE) MG tablet Take by mouth.    [provider]  folic acid (FOLVITE) 1 MG tablet Take 1 tablet (1 mg total) by mouth daily. 10/13/18   Lloyd Huger, MD  furosemide (LASIX) 20 MG tablet Take 1 tablet (20 mg total) by mouth 2 (two) times daily for 3 days. 08/11/19 08/14/19  Duffy Bruce, MD  gabapentin (NEURONTIN) 300 MG capsule Take 300  mg in the morning and 600 mg at night 05/25/19   [provider]  lisinopril-hydrochlorothiazide (PRINZIDE,ZESTORETIC) 20-12.5 MG tablet Take 1 tablet by mouth daily. 01/09/17   [provider]  potassium chloride 20 MEQ TBCR Take 20 mEq by mouth 2 (two) times daily for 3 days. 08/11/19 08/14/19  Duffy Bruce, MD  sacubitril-valsartan (ENTRESTO) 49-51 MG Take by mouth. 07/08/19   [provider]  sildenafil (REVATIO) 20 MG tablet Take 20 mg by mouth as needed.    [provider]  spironolactone (ALDACTONE) 25 MG tablet Take by mouth daily.  05/11/19   [provider]  vitamin B-12 (CYANOCOBALAMIN) 1000 MCG tablet Take by mouth.    [provider]    Allergies Patient has no known allergies.  Family History  Problem Relation Age of Onset  . Heart failure Mother   . Renal Disease Mother   . Bone cancer Father     Social History Social History   Tobacco Use  . Smoking status: Current Every Day Smoker    Packs/day: 0.50    Years: 40.00    Pack years: 20.00    Types: Cigarettes  . Smokeless tobacco: Never Used  Substance Use Topics  . Alcohol use: Yes    Comment: 1/4 pint of vodka daily  . Drug use: Yes    Types: Marijuana    Review of Systems  Review of Systems  Constitutional: Positive for fatigue. Negative for chills and fever.  HENT: Negative for sore throat.   Respiratory: Positive for cough and shortness of breath.   Cardiovascular: Positive for leg swelling. Negative for chest pain.  Gastrointestinal: Negative for abdominal pain.  Genitourinary: Negative for flank pain.  Musculoskeletal: Negative for neck pain.  Skin: Negative for rash and wound.  Allergic/Immunologic: Negative for immunocompromised state.  Neurological: Positive for weakness. Negative for numbness.  Hematological: Does not bruise/bleed easily.  All other systems reviewed and are negative.    ____________________________________________  PHYSICAL  EXAM:      VITAL SIGNS: ED Triage Vitals  Enc Vitals Group     BP 08/10/19 2305 (!) 102/59     Pulse Rate 08/10/19 2305 75     Resp 08/10/19 2305 16     Temp 08/10/19 2305 98.8 F (37.1 C)     Temp Source 08/10/19 2305 Oral     SpO2 08/10/19 2305 96 %     Weight 08/10/19 2306 163 lb (73.9 kg)     Height 08/10/19 2306 5\' 7"  (1.702 m)     Head Circumference --      Peak Flow --      Pain Score 08/10/19 2306 0     Pain Loc --      Pain Edu? --      Excl. in [place]? --      Physical Exam Vitals and nursing note reviewed.  Constitutional:      General: He is not in acute distress.  Appearance: He is well-developed.  HENT:     Head: Normocephalic and atraumatic.  Eyes:     Conjunctiva/sclera: Conjunctivae normal.  Cardiovascular:     Rate and Rhythm: Normal rate and regular rhythm.     Heart sounds: Normal heart sounds.  Pulmonary:     Effort: Pulmonary effort is normal. No respiratory distress.     Breath sounds: Rales present. No wheezing.  Abdominal:     General: There is no distension.  Musculoskeletal:     Cervical back: Neck supple.     Right lower leg: Edema (2+) present.     Left lower leg: Edema (2+) present.  Skin:    General: Skin is warm.     Capillary Refill: Capillary refill takes less than 2 seconds.     Findings: No rash.  Neurological:     Mental Status: He is alert and oriented to person, place, and time.     Motor: No abnormal muscle tone.       ____________________________________________   LABS (all labs ordered are listed, but only abnormal results are displayed)  Labs Reviewed  BASIC METABOLIC PANEL - Abnormal; Notable for the following components:      Result Value   Potassium 2.5 (*)    All other components within normal limits  CBC - Abnormal; Notable for the following components:   RBC 2.67 (*)    Hemoglobin 9.2 (*)    HCT 26.9 (*)    MCV 100.7 (*)    MCH 34.5 (*)    RDW 18.4 (*)    All other components within normal limits    BRAIN NATRIURETIC PEPTIDE  MAGNESIUM    ____________________________________________  EKG: Normal sinus rhythm, VR 79. PR 160, QRS 110, QTc 518. Non-specific TW changes in lateral precordial leads. No acute ST elevations.  ________________________________________  RADIOLOGY All imaging, including plain films, CT scans, and ultrasounds, independently reviewed by me, and interpretations confirmed via formal radiology reads.  ED MD interpretation:   CXR: Clear, no PNA or significant edema U/S LE: Neg for DVT  Official radiology report(s): DG Chest 2 View  Result Date: 08/10/2019 CLINICAL DATA:  59 year old male with bilateral leg swelling. EXAM: CHEST - 2 VIEW COMPARISON:  Chest radiograph dated 10/24/2017. FINDINGS: Mild vascular congestion. No focal consolidation, pleural effusion or pneumothorax. Borderline cardiomegaly. Atherosclerotic calcification of the aortic arch. No acute osseous pathology. IMPRESSION: Mild vascular congestion. No focal consolidation. Electronically Signed   By: Anner Crete M.D.   On: 08/10/2019 23:37   US Venous Img Lower Bilateral  Result Date: 08/11/2019 CLINICAL DATA:  Bilateral leg swelling EXAM: BILATERAL LOWER EXTREMITY VENOUS DOPPLER ULTRASOUND TECHNIQUE: Gray-scale sonography with compression, as well as color and duplex ultrasound, were performed to evaluate the deep venous system(s) from the level of the common femoral vein through the popliteal and proximal calf veins. COMPARISON:  None. FINDINGS: VENOUS Compressible common femoral, superficial femoral, and popliteal veins, as well as the visualized calf veins. Visualized portions of profunda femoral vein and great saphenous vein unremarkable. No filling defects to suggest DVT on grayscale or color Doppler imaging. Doppler waveforms show normal respiratory plasticity and response to augmentation. Bilateral comma shaped avascular cystic structures are seen in the popliteal fossas. The cysts measure up to  3.2 cm on the left and 3.5 cm on the right. Subcutaneous edema seen at the right calf. Atherosclerotic calcifications seen at the level of the femoral arteries. IMPRESSION: 1. Negative for DVT in either lower extremity. 2. Bilateral Baker's  cyst. Electronically Signed   By: Monte Fantasia M.D.   On: 08/11/2019 04:44    ____________________________________________  PROCEDURES   Procedure(s) performed (including Critical Care):  Procedures  ____________________________________________  INITIAL IMPRESSION / MDM / [place] / ED COURSE  As part of my medical decision making, I reviewed the following data within the [place] notes reviewed and incorporated, Old chart reviewed, Notes from prior ED visits, and [place] Controlled Substance Database       *KAREAM KISIEL was evaluated in Emergency Department on 08/11/2019 for the symptoms described in the history of present illness. He was evaluated in the context of the global COVID-19 pandemic, which necessitated consideration that the patient might be at risk for infection with the SARS-CoV-2 virus that causes COVID-19. Institutional protocols and algorithms that pertain to the evaluation of patients at risk for COVID-19 are in a state of rapid change based on information released by regulatory bodies including the CDC and federal and state organizations. These policies and algorithms were followed during the patient's care in the ED.  Some ED evaluations and interventions may be delayed as a result of limited staffing during the pandemic.*     Medical Decision Making:  60 yo M here with LE edema and pain. Suspect combination of lymphedema, mild CHF, and chronic venous insufficiency. DVT study negative. Distal pulses are intact without signs of arterial insufficiency. No signs of CHF on CXR. Of note, pt also hypokalemic - this is likely 2/2 his poor diet and alcohol use, but does complicate his diuresis. Will  replace Mag, K here in ED. Plan to increase lasix as outpt with K replacement, f/u re: his lymphedema management, and wear compression stockings.  ____________________________________________  FINAL CLINICAL IMPRESSION(S) / ED DIAGNOSES  Final diagnoses:  Peripheral edema     MEDICATIONS GIVEN DURING THIS VISIT:  Medications  sodium chloride flush (NS) 0.9 % injection 3 mL (3 mLs Intravenous Not Given 08/11/19 0109)  potassium chloride 10 mEq in 100 mL IVPB (0 mEq Intravenous Stopped 08/11/19 0413)  magnesium sulfate IVPB 2 g 50 mL (0 g Intravenous Stopped 08/11/19 0217)  0.9 %  sodium chloride infusion ( Intravenous Stopped 08/11/19 0413)  potassium chloride 20 MEQ/15ML (10%) solution 40 mEq (20 mEq Oral Given 08/11/19 0246)  potassium chloride SA (KLOR-CON) CR tablet 40 mEq (40 mEq Oral Given 08/11/19 0526)     ED Discharge Orders         Ordered    potassium chloride 20 MEQ TBCR  2 times daily     08/11/19 0628    furosemide (LASIX) 20 MG tablet  2 times daily     08/11/19 [id number]           Note:  This document was prepared using Dragon voice recognition software and may include unintentional dictation errors.   Duffy Bruce, MD 08/11/19 [phone]

## 2019-08-11 NOTE — Discharge Instructions (Addendum)
For your leg swelling:  We are going to increase your LASIX from 20 mg once a day to 20 mg TWICE a day  We are going to supplement your potassium while doing this  [place] with your home health agency about the device for squeezing fluid in your legs

## 2019-09-25 ENCOUNTER — Encounter: Payer: Self-pay | Admitting: *Deleted

## 2019-09-25 ENCOUNTER — Inpatient Hospital Stay
Admission: EM | Admit: 2019-09-25 | Discharge: 2019-10-04 | DRG: 308 | Disposition: A | Discharge status: Hospice | Payer: BC Managed Care – PPO | Attending: Internal Medicine | Admitting: Internal Medicine

## 2019-09-25 ENCOUNTER — Inpatient Hospital Stay: Payer: BC Managed Care – PPO

## 2019-09-25 ENCOUNTER — Other Ambulatory Visit: Payer: Self-pay

## 2019-09-25 DIAGNOSIS — I480 Paroxysmal atrial fibrillation: Secondary | ICD-10-CM | POA: Diagnosis present

## 2019-09-25 DIAGNOSIS — R069 Unspecified abnormalities of breathing: Secondary | ICD-10-CM

## 2019-09-25 DIAGNOSIS — E781 Pure hyperglyceridemia: Secondary | ICD-10-CM | POA: Diagnosis present

## 2019-09-25 DIAGNOSIS — Z7982 Long term (current) use of aspirin: Secondary | ICD-10-CM

## 2019-09-25 DIAGNOSIS — F10239 Alcohol dependence with withdrawal, unspecified: Secondary | ICD-10-CM | POA: Diagnosis present

## 2019-09-25 DIAGNOSIS — E876 Hypokalemia: Secondary | ICD-10-CM

## 2019-09-25 DIAGNOSIS — I110 Hypertensive heart disease with heart failure: Secondary | ICD-10-CM | POA: Diagnosis present

## 2019-09-25 DIAGNOSIS — I469 Cardiac arrest, cause unspecified: Secondary | ICD-10-CM | POA: Diagnosis not present

## 2019-09-25 DIAGNOSIS — M1712 Unilateral primary osteoarthritis, left knee: Secondary | ICD-10-CM | POA: Diagnosis present

## 2019-09-25 DIAGNOSIS — F1010 Alcohol abuse, uncomplicated: Secondary | ICD-10-CM | POA: Diagnosis not present

## 2019-09-25 DIAGNOSIS — E43 Unspecified severe protein-calorie malnutrition: Secondary | ICD-10-CM | POA: Diagnosis present

## 2019-09-25 DIAGNOSIS — I959 Hypotension, unspecified: Secondary | ICD-10-CM | POA: Diagnosis not present

## 2019-09-25 DIAGNOSIS — I340 Nonrheumatic mitral (valve) insufficiency: Secondary | ICD-10-CM | POA: Diagnosis present

## 2019-09-25 DIAGNOSIS — Z841 Family history of disorders of kidney and ureter: Secondary | ICD-10-CM

## 2019-09-25 DIAGNOSIS — I420 Dilated cardiomyopathy: Secondary | ICD-10-CM | POA: Diagnosis present

## 2019-09-25 DIAGNOSIS — Z23 Encounter for immunization: Secondary | ICD-10-CM

## 2019-09-25 DIAGNOSIS — Z7189 Other specified counseling: Secondary | ICD-10-CM

## 2019-09-25 DIAGNOSIS — I472 Ventricular tachycardia, unspecified: Secondary | ICD-10-CM

## 2019-09-25 DIAGNOSIS — D6489 Other specified anemias: Secondary | ICD-10-CM | POA: Diagnosis present

## 2019-09-25 DIAGNOSIS — I5023 Acute on chronic systolic (congestive) heart failure: Secondary | ICD-10-CM | POA: Diagnosis present

## 2019-09-25 DIAGNOSIS — F17210 Nicotine dependence, cigarettes, uncomplicated: Secondary | ICD-10-CM | POA: Diagnosis present

## 2019-09-25 DIAGNOSIS — R570 Cardiogenic shock: Secondary | ICD-10-CM | POA: Diagnosis not present

## 2019-09-25 DIAGNOSIS — I429 Cardiomyopathy, unspecified: Secondary | ICD-10-CM | POA: Diagnosis not present

## 2019-09-25 DIAGNOSIS — Z809 Family history of malignant neoplasm, unspecified: Secondary | ICD-10-CM

## 2019-09-25 DIAGNOSIS — R55 Syncope and collapse: Secondary | ICD-10-CM | POA: Diagnosis present

## 2019-09-25 DIAGNOSIS — Z515 Encounter for palliative care: Secondary | ICD-10-CM

## 2019-09-25 DIAGNOSIS — Z7901 Long term (current) use of anticoagulants: Secondary | ICD-10-CM

## 2019-09-25 DIAGNOSIS — Z79899 Other long term (current) drug therapy: Secondary | ICD-10-CM

## 2019-09-25 DIAGNOSIS — I70209 Unspecified atherosclerosis of native arteries of extremities, unspecified extremity: Secondary | ICD-10-CM | POA: Diagnosis present

## 2019-09-25 DIAGNOSIS — K551 Chronic vascular disorders of intestine: Secondary | ICD-10-CM | POA: Diagnosis present

## 2019-09-25 DIAGNOSIS — I5084 End stage heart failure: Secondary | ICD-10-CM | POA: Diagnosis present

## 2019-09-25 DIAGNOSIS — N179 Acute kidney failure, unspecified: Secondary | ICD-10-CM | POA: Diagnosis not present

## 2019-09-25 DIAGNOSIS — K7200 Acute and subacute hepatic failure without coma: Secondary | ICD-10-CM | POA: Diagnosis not present

## 2019-09-25 DIAGNOSIS — J9600 Acute respiratory failure, unspecified whether with hypoxia or hypercapnia: Secondary | ICD-10-CM

## 2019-09-25 DIAGNOSIS — I255 Ischemic cardiomyopathy: Secondary | ICD-10-CM | POA: Diagnosis not present

## 2019-09-25 DIAGNOSIS — Z8616 Personal history of COVID-19: Secondary | ICD-10-CM | POA: Diagnosis not present

## 2019-09-25 DIAGNOSIS — Z66 Do not resuscitate: Secondary | ICD-10-CM | POA: Diagnosis not present

## 2019-09-25 DIAGNOSIS — I462 Cardiac arrest due to underlying cardiac condition: Secondary | ICD-10-CM | POA: Diagnosis not present

## 2019-09-25 DIAGNOSIS — J9601 Acute respiratory failure with hypoxia: Secondary | ICD-10-CM | POA: Diagnosis not present

## 2019-09-25 DIAGNOSIS — R579 Shock, unspecified: Secondary | ICD-10-CM | POA: Diagnosis not present

## 2019-09-25 DIAGNOSIS — Z8249 Family history of ischemic heart disease and other diseases of the circulatory system: Secondary | ICD-10-CM

## 2019-09-25 DIAGNOSIS — I4891 Unspecified atrial fibrillation: Secondary | ICD-10-CM | POA: Diagnosis not present

## 2019-09-25 DIAGNOSIS — R5381 Other malaise: Secondary | ICD-10-CM | POA: Diagnosis present

## 2019-09-25 DIAGNOSIS — I712 Thoracic aortic aneurysm, without rupture: Secondary | ICD-10-CM | POA: Diagnosis present

## 2019-09-25 DIAGNOSIS — Z789 Other specified health status: Secondary | ICD-10-CM

## 2019-09-25 LAB — BLOOD GAS, ARTERIAL
Acid-Base Excess: 9 mmol/L — ABNORMAL HIGH (ref 0.0–2.0)
Bicarbonate: 33.5 mmol/L — ABNORMAL HIGH (ref 20.0–28.0)
FIO2: 1
O2 Saturation: 98.7 %
Patient temperature: 37
pCO2 arterial: 45 mmHg (ref 32.0–48.0)
pH, Arterial: 7.48 — ABNORMAL HIGH (ref 7.350–7.450)
pO2, Arterial: 114 mmHg — ABNORMAL HIGH (ref 83.0–108.0)

## 2019-09-25 LAB — COMPREHENSIVE METABOLIC PANEL
ALT: 34 U/L (ref 0–44)
ALT: 51 U/L — ABNORMAL HIGH (ref 0–44)
AST: 81 U/L — ABNORMAL HIGH (ref 15–41)
AST: 174 U/L — ABNORMAL HIGH (ref 15–41)
Albumin: 3.2 g/dL — ABNORMAL LOW (ref 3.5–5.0)
Albumin: 2.6 g/dL — ABNORMAL LOW (ref 3.5–5.0)
Alkaline Phosphatase: 35 U/L — ABNORMAL LOW (ref 38–126)
Alkaline Phosphatase: 36 U/L — ABNORMAL LOW (ref 38–126)
Anion gap: 19 — ABNORMAL HIGH (ref 5–15)
Anion gap: 11 (ref 5–15)
BUN: 8 mg/dL (ref 6–20)
BUN: 9 mg/dL (ref 6–20)
CO2: 31 mmol/L (ref 22–32)
CO2: 33 mmol/L — ABNORMAL HIGH (ref 22–32)
Calcium: 8.7 mg/dL — ABNORMAL LOW (ref 8.9–10.3)
Calcium: 8.1 mg/dL — ABNORMAL LOW (ref 8.9–10.3)
Chloride: 82 mmol/L — ABNORMAL LOW (ref 98–111)
Chloride: 90 mmol/L — ABNORMAL LOW (ref 98–111)
Creatinine, Ser: 1.38 mg/dL — ABNORMAL HIGH (ref 0.61–1.24)
Creatinine, Ser: 1.22 mg/dL (ref 0.61–1.24)
GFR calc Af Amer: >60 mL/min (ref >60)
GFR calc Af Amer: >60 mL/min (ref >60)
GFR calc non Af Amer: 55 mL/min — ABNORMAL LOW (ref >60)
GFR calc non Af Amer: >60 mL/min (ref >60)
Glucose, Bld: 134 mg/dL — ABNORMAL HIGH (ref 70–99)
Glucose, Bld: 230 mg/dL — ABNORMAL HIGH (ref 70–99)
Potassium: <2 mmol/L — CL (ref 3.5–5.1)
Potassium: <2 mmol/L — CL (ref 3.5–5.1)
Sodium: 132 mmol/L — ABNORMAL LOW (ref 135–145)
Sodium: 134 mmol/L — ABNORMAL LOW (ref 135–145)
Total Bilirubin: 1.7 mg/dL — ABNORMAL HIGH (ref 0.3–1.2)
Total Bilirubin: 1.1 mg/dL (ref 0.3–1.2)
Total Protein: 6.7 g/dL (ref 6.5–8.1)
Total Protein: 5.9 g/dL — ABNORMAL LOW (ref 6.5–8.1)

## 2019-09-25 LAB — CBC
HCT: 32.5 % — ABNORMAL LOW (ref 39.0–52.0)
HCT: 28.6 % — ABNORMAL LOW (ref 39.0–52.0)
Hemoglobin: 11.8 g/dL — ABNORMAL LOW (ref 13.0–17.0)
Hemoglobin: 10 g/dL — ABNORMAL LOW (ref 13.0–17.0)
MCH: 34.8 pg — ABNORMAL HIGH (ref 26.0–34.0)
MCH: 34 pg (ref 26.0–34.0)
MCHC: 36.3 g/dL — ABNORMAL HIGH (ref 30.0–36.0)
MCHC: 35 g/dL (ref 30.0–36.0)
MCV: 95.9 fL (ref 80.0–100.0)
MCV: 97.3 fL (ref 80.0–100.0)
Platelets: 153 10*3/uL (ref 150–400)
Platelets: 139 10*3/uL — ABNORMAL LOW (ref 150–400)
RBC: 3.39 MIL/uL — ABNORMAL LOW (ref 4.22–5.81)
RBC: 2.94 MIL/uL — ABNORMAL LOW (ref 4.22–5.81)
RDW: 11.8 % (ref 11.5–15.5)
RDW: 11.8 % (ref 11.5–15.5)
WBC: 8.7 10*3/uL (ref 4.0–10.5)
WBC: 6.4 10*3/uL (ref 4.0–10.5)
nRBC: 0 % (ref 0.0–0.2)
nRBC: 0 % (ref 0.0–0.2)

## 2019-09-25 LAB — BASIC METABOLIC PANEL
Anion gap: 12 (ref 5–15)
BUN: 8 mg/dL (ref 6–20)
CO2: 35 mmol/L — ABNORMAL HIGH (ref 22–32)
Calcium: 8.4 mg/dL — ABNORMAL LOW (ref 8.9–10.3)
Chloride: 86 mmol/L — ABNORMAL LOW (ref 98–111)
Creatinine, Ser: 1.17 mg/dL (ref 0.61–1.24)
GFR calc Af Amer: >60 mL/min (ref >60)
GFR calc non Af Amer: >60 mL/min (ref >60)
Glucose, Bld: 230 mg/dL — ABNORMAL HIGH (ref 70–99)
Potassium: 2.2 mmol/L — CL (ref 3.5–5.1)
Sodium: 133 mmol/L — ABNORMAL LOW (ref 135–145)

## 2019-09-25 LAB — MAGNESIUM
Magnesium: 1.3 mg/dL — ABNORMAL LOW (ref 1.7–2.4)
Magnesium: 2.2 mg/dL (ref 1.7–2.4)
Magnesium: 2.8 mg/dL — ABNORMAL HIGH (ref 1.7–2.4)

## 2019-09-25 LAB — ETHANOL: Alcohol, Ethyl (B): 38 mg/dL — ABNORMAL HIGH (ref <10)

## 2019-09-25 LAB — MRSA PCR SCREENING: MRSA by PCR: NEGATIVE

## 2019-09-25 LAB — PHOSPHORUS
Phosphorus: 2.7 mg/dL (ref 2.5–4.6)
Phosphorus: 1.2 mg/dL — ABNORMAL LOW (ref 2.5–4.6)
Phosphorus: 1.1 mg/dL — ABNORMAL LOW (ref 2.5–4.6)

## 2019-09-25 LAB — TSH: TSH: 0.86 u[IU]/mL (ref 0.350–4.500)

## 2019-09-25 LAB — GLUCOSE, CAPILLARY
Glucose-Capillary: 152 mg/dL — ABNORMAL HIGH (ref 70–99)
Glucose-Capillary: 225 mg/dL — ABNORMAL HIGH (ref 70–99)

## 2019-09-25 LAB — APTT: aPTT: 37 seconds — ABNORMAL HIGH (ref 24–36)

## 2019-09-25 LAB — LIPASE, BLOOD: Lipase: 84 U/L — ABNORMAL HIGH (ref 11–51)

## 2019-09-25 LAB — PROTIME-INR
INR: 1.1 (ref 0.8–1.2)
Prothrombin Time: 13.5 seconds (ref 11.4–15.2)

## 2019-09-25 LAB — T4, FREE: Free T4: 0.88 ng/dL (ref 0.61–1.12)

## 2019-09-25 LAB — POTASSIUM: Potassium: <2 mmol/L — CL (ref 3.5–5.1)

## 2019-09-25 LAB — TROPONIN I (HIGH SENSITIVITY): Troponin I (High Sensitivity): 111 ng/L (ref <18)

## 2019-09-25 LAB — SARS CORONAVIRUS 2 BY RT PCR (HOSPITAL ORDER, PERFORMED IN ~~LOC~~ HOSPITAL LAB): SARS Coronavirus 2: NEGATIVE

## 2019-09-25 LAB — CORTISOL: Cortisol, Plasma: 14.6 ug/dL

## 2019-09-25 MED ORDER — SODIUM CHLORIDE 0.9 % IV BOLUS
1000.0000 mL | Freq: Once | INTRAVENOUS | Status: AC
  Administered 2019-09-25: 1000 mL via INTRAVENOUS

## 2019-09-25 MED ORDER — POLYETHYLENE GLYCOL 3350 17 G PO PACK: 17.0000 g | PACK | Freq: Every day | ORAL | Status: DC | PRN

## 2019-09-25 MED ORDER — NOREPINEPHRINE 16 MG/250ML-% IV SOLN
0.0000 ug/min | INTRAVENOUS | Status: DC
  Administered 2019-09-26 (×2): 18 ug/min via INTRAVENOUS
  Administered 2019-09-27: 24 ug/min via INTRAVENOUS
  Administered 2019-09-27: 23 ug/min via INTRAVENOUS
  Administered 2019-09-28: 10 ug/min via INTRAVENOUS
  Administered 2019-09-28: 13.003 ug/min via INTRAVENOUS
  Filled 2019-09-25 (×6): qty 250

## 2019-09-25 MED ORDER — SODIUM CHLORIDE 0.9 % IV SOLN
8.0000 mg/h | INTRAVENOUS | Status: DC
  Administered 2019-09-26 – 2019-09-27 (×4): 8 mg/h via INTRAVENOUS
  Filled 2019-09-25 (×4): qty 80

## 2019-09-25 MED ORDER — DOCUSATE SODIUM 100 MG PO CAPS: 100.0000 mg | ORAL_CAPSULE | Freq: Two times a day (BID) | ORAL | Status: DC | PRN

## 2019-09-25 MED ORDER — POTASSIUM CHLORIDE CRYS ER 20 MEQ PO TBCR
40.0000 meq | EXTENDED_RELEASE_TABLET | Freq: Once | ORAL | Status: AC
  Administered 2019-09-25: 40 meq via ORAL
  Filled 2019-09-25: qty 2

## 2019-09-25 MED ORDER — SODIUM PHOSPHATES 45 MMOLE/15ML IV SOLN
30.0000 mmol | Freq: Once | INTRAVENOUS | Status: AC
  Administered 2019-09-25: 30 mmol via INTRAVENOUS
  Filled 2019-09-25: qty 10

## 2019-09-25 MED ORDER — CHLORHEXIDINE GLUCONATE CLOTH 2 % EX PADS
6.0000 | MEDICATED_PAD | Freq: Every day | CUTANEOUS | Status: DC
  Administered 2019-09-25: 6 via TOPICAL

## 2019-09-25 MED ORDER — LIDOCAINE IN D5W 4-5 MG/ML-% IV SOLN
2.0000 mg/min | INTRAVENOUS | Status: DC
  Administered 2019-09-25 – 2019-09-28 (×4): 2 mg/min via INTRAVENOUS
  Filled 2019-09-25 (×4): qty 500

## 2019-09-25 MED ORDER — AMIODARONE HCL IN DEXTROSE 360-4.14 MG/200ML-% IV SOLN
60.0000 mg/h | INTRAVENOUS | Status: DC
  Administered 2019-09-25: 60 mg/h via INTRAVENOUS
  Filled 2019-09-25: qty 200

## 2019-09-25 MED ORDER — FENTANYL CITRATE (PF) 100 MCG/2ML IJ SOLN
12.5000 ug | INTRAMUSCULAR | Status: DC | PRN
  Administered 2019-09-26: 12.5 ug via INTRAVENOUS
  Filled 2019-09-25: qty 2

## 2019-09-25 MED ORDER — NICOTINE 21 MG/24HR TD PT24
21.0000 mg | MEDICATED_PATCH | Freq: Every day | TRANSDERMAL | Status: DC
  Administered 2019-09-25 – 2019-10-04 (×9): 21 mg via TRANSDERMAL
  Filled 2019-09-25 (×9): qty 1

## 2019-09-25 MED ORDER — PANTOPRAZOLE SODIUM 40 MG IV SOLR
40.0000 mg | Freq: Two times a day (BID) | INTRAVENOUS | Status: DC
  Administered 2019-09-29 – 2019-10-04 (×10): 40 mg via INTRAVENOUS
  Filled 2019-09-25 (×10): qty 40

## 2019-09-25 MED ORDER — MAGNESIUM SULFATE 2 GM/50ML IV SOLN
2.0000 g | Freq: Once | INTRAVENOUS | Status: AC
  Administered 2019-09-25: 2 g via INTRAVENOUS
  Filled 2019-09-25: qty 50

## 2019-09-25 MED ORDER — POTASSIUM CHLORIDE 10 MEQ/100ML IV SOLN
10.0000 meq | INTRAVENOUS | Status: AC
  Administered 2019-09-25 (×3): 10 meq via INTRAVENOUS
  Filled 2019-09-25 (×7): qty 100

## 2019-09-25 MED ORDER — POTASSIUM PHOSPHATES 15 MMOLE/5ML IV SOLN
30.0000 mmol | Freq: Once | INTRAVENOUS | Status: AC
  Administered 2019-09-25: 30 mmol via INTRAVENOUS
  Filled 2019-09-25: qty 10

## 2019-09-25 MED ORDER — KCL-LACTATED RINGERS-D5W 20 MEQ/L IV SOLN
INTRAVENOUS | Status: DC
  Filled 2019-09-25 (×4): qty 1000

## 2019-09-25 MED ORDER — POTASSIUM CHLORIDE 10 MEQ/100ML IV SOLN
10.0000 meq | INTRAVENOUS | Status: DC
  Administered 2019-09-25: 10 meq via INTRAVENOUS
  Filled 2019-09-25 (×4): qty 100

## 2019-09-25 MED ORDER — INSULIN ASPART 100 UNIT/ML ~~LOC~~ SOLN
0.0000 [IU] | SUBCUTANEOUS | Status: DC
  Administered 2019-09-26: 3 [IU] via SUBCUTANEOUS
  Administered 2019-09-26 (×2): 2 [IU] via SUBCUTANEOUS
  Filled 2019-09-25 (×3): qty 1

## 2019-09-25 MED ORDER — ENOXAPARIN SODIUM 40 MG/0.4ML ~~LOC~~ SOLN
40.0000 mg | SUBCUTANEOUS | Status: DC
  Administered 2019-09-25 – 2019-10-01 (×6): 40 mg via SUBCUTANEOUS
  Filled 2019-09-25 (×6): qty 0.4

## 2019-09-25 MED ORDER — LIDOCAINE BOLUS VIA INFUSION
75.0000 mg | Freq: Once | INTRAVENOUS | Status: AC
  Administered 2019-09-25: 75 mg via INTRAVENOUS
  Filled 2019-09-25: qty 76

## 2019-09-25 MED ORDER — NOREPINEPHRINE 4 MG/250ML-% IV SOLN
2.0000 ug/min | INTRAVENOUS | Status: DC
  Administered 2019-09-25: 4 ug/min via INTRAVENOUS
  Administered 2019-09-25: 2 ug/min via INTRAVENOUS
  Filled 2019-09-25 (×2): qty 250

## 2019-09-25 MED ORDER — THIAMINE HCL 100 MG/ML IJ SOLN
500.0000 mg | Freq: Every day | INTRAVENOUS | Status: AC
  Administered 2019-09-25 – 2019-09-29 (×5): 500 mg via INTRAVENOUS
  Filled 2019-09-25 (×5): qty 5

## 2019-09-25 MED ORDER — PIPERACILLIN-TAZOBACTAM 3.375 G IVPB
3.3750 g | Freq: Three times a day (TID) | INTRAVENOUS | Status: DC
  Administered 2019-09-25 – 2019-09-27 (×5): 3.375 g via INTRAVENOUS
  Filled 2019-09-25 (×7): qty 50

## 2019-09-25 MED ORDER — POTASSIUM CHLORIDE 10 MEQ/100ML IV SOLN
10.0000 meq | Freq: Once | INTRAVENOUS | Status: AC
  Administered 2019-09-25: 10 meq via INTRAVENOUS
  Filled 2019-09-25: qty 100

## 2019-09-25 MED ORDER — AMIODARONE IV BOLUS ONLY 150 MG/100ML
150.0000 mg | Freq: Once | INTRAVENOUS | Status: AC
  Administered 2019-09-25: 150 mg via INTRAVENOUS

## 2019-09-25 MED ORDER — ONDANSETRON HCL 4 MG/2ML IJ SOLN: 4.0000 mg | Freq: Four times a day (QID) | INTRAMUSCULAR | Status: DC | PRN

## 2019-09-25 MED ORDER — SODIUM CHLORIDE 0.9 % IV SOLN
250.0000 mL | INTRAVENOUS | Status: DC
  Administered 2019-09-25: 250 mL via INTRAVENOUS

## 2019-09-25 MED ORDER — POTASSIUM CHLORIDE 10 MEQ/50ML IV SOLN
10.0000 meq | INTRAVENOUS | Status: AC
  Administered 2019-09-25 – 2019-09-26 (×6): 10 meq via INTRAVENOUS
  Filled 2019-09-25 (×6): qty 50

## 2019-09-25 MED ORDER — CALCIUM GLUCONATE-NACL 1-0.675 GM/50ML-% IV SOLN
1.0000 g | Freq: Once | INTRAVENOUS | Status: AC
  Administered 2019-09-26: 1000 mg via INTRAVENOUS
  Filled 2019-09-25: qty 50

## 2019-09-25 MED ORDER — PANTOPRAZOLE SODIUM 40 MG IV SOLR
40.0000 mg | Freq: Once | INTRAVENOUS | Status: AC
  Administered 2019-09-26: 40 mg via INTRAVENOUS
  Filled 2019-09-25: qty 40

## 2019-09-25 MED ORDER — LORAZEPAM 2 MG/ML IJ SOLN
1.0000 mg | INTRAMUSCULAR | Status: DC | PRN
  Administered 2019-09-26: 1 mg via INTRAVENOUS
  Administered 2019-09-27: 2 mg via INTRAVENOUS
  Filled 2019-09-25 (×2): qty 1

## 2019-09-25 MED ORDER — POTASSIUM CHLORIDE CRYS ER 20 MEQ PO TBCR: 40.0000 meq | EXTENDED_RELEASE_TABLET | Freq: Once | ORAL | Status: DC

## 2019-09-25 MED ORDER — POTASSIUM CHLORIDE 10 MEQ/100ML IV SOLN
10.0000 meq | INTRAVENOUS | Status: AC
  Administered 2019-09-25 (×3): 10 meq via INTRAVENOUS
  Filled 2019-09-25 (×3): qty 100

## 2019-09-25 MED ORDER — AMIODARONE LOAD VIA INFUSION
150.0000 mg | Freq: Once | INTRAVENOUS | Status: DC
  Filled 2019-09-25: qty 83.34

## 2019-09-25 MED ORDER — LORAZEPAM 2 MG/ML IJ SOLN
1.0000 mg | Freq: Once | INTRAMUSCULAR | Status: AC
  Administered 2019-09-25: 1 mg via INTRAVENOUS
  Filled 2019-09-25: qty 1

## 2019-09-25 MED ORDER — ORAL CARE MOUTH RINSE
15.0000 mL | Freq: Two times a day (BID) | OROMUCOSAL | Status: DC
  Administered 2019-09-26 – 2019-10-04 (×10): 15 mL via OROMUCOSAL

## 2019-09-25 MED ORDER — MAGNESIUM SULFATE 4 GM/100ML IV SOLN
4.0000 g | Freq: Once | INTRAVENOUS | Status: AC
  Administered 2019-09-25: 4 g via INTRAVENOUS
  Filled 2019-09-25: qty 100

## 2019-09-25 MED ORDER — PANTOPRAZOLE SODIUM 40 MG IV SOLR: 40.0000 mg | Freq: Every day | INTRAVENOUS | Status: DC

## 2019-09-25 MED ORDER — AMIODARONE HCL IN DEXTROSE 360-4.14 MG/200ML-% IV SOLN
30.0000 mg/h | INTRAVENOUS | Status: DC
  Administered 2019-09-25 (×2): 30 mg/h via INTRAVENOUS
  Filled 2019-09-25: qty 200

## 2019-09-25 MED ORDER — THIAMINE HCL 100 MG/ML IJ SOLN
100.0000 mg | INTRAMUSCULAR | Status: AC
  Administered 2019-09-25: 100 mg via INTRAVENOUS
  Filled 2019-09-25: qty 2

## 2019-09-25 NOTE — ED Provider Notes (Signed)
[facility] Department of Emergency Medicine   Code Blue CONSULT NOTE  Chief Complaint: Cardiac arrest/unresponsive   Level V Caveat: Unresponsive  History of present illness: I was contacted by the hospital for a CODE BLUE cardiac arrest upstairs and presented to the patient's bedside.    ROS: Unable to obtain, Level V caveat  Scheduled Meds: . Chlorhexidine Gluconate Cloth  6 each Topical Daily  . enoxaparin (LOVENOX) injection  40 mg Subcutaneous Q24H  . lidocaine  75 mg Intravenous Once  . mouth rinse  15 mL Mouth Rinse BID  . nicotine  21 mg Transdermal Daily  . pantoprazole (PROTONIX) IV  40 mg Intravenous QHS  . potassium chloride  40 mEq Oral Once   Continuous Infusions: . sodium chloride 10 mL/hr at 09/25/19 1600  . sodium chloride Stopped (09/25/19 1211)  . dextrose 5% lactated ringers with KCl 20 mEq/L 100 mL/hr at 09/25/19 1600  . lidocaine    . magnesium sulfate bolus IVPB 4 g (09/25/19 2105)  . norepinephrine (LEVOPHED) Adult infusion 4 mcg/min (09/25/19 1600)  . potassium PHOSPHATE IVPB (in mmol) 30 mmol (09/25/19 2054)  . thiamine injection 100 mL/hr at 09/25/19 1600   PRN Meds:.docusate sodium, LORazepam, ondansetron (ZOFRAN) IV, polyethylene glycol Past Medical History:  Diagnosis Date  . Abnormal CT scan, colon 11/27/2017  . Alcohol abuse   . Cardiomyopathy ([place])   . CHF (congestive heart failure) ([place])   . Colon polyps   . ED (erectile dysfunction)   . Hypertension   . Hypertriglyceridemia   . Osteoarthritis   . Osteoarthritis    Past Surgical History:  Procedure Laterality Date  . COLONOSCOPY    . COLONOSCOPY WITH PROPOFOL N/A 02/02/2018   Procedure: COLONOSCOPY WITH PROPOFOL;  Surgeon: Manya Silvas, MD;  Location: [facility] ENDOSCOPY;  Service: Endoscopy;  Laterality: N/A;  . LOWER EXTREMITY ANGIOGRAPHY Right 06/21/2019   Procedure: LOWER EXTREMITY ANGIOGRAPHY;  Surgeon: Algernon Huxley, MD;  Location: [place] CV LAB;   Service: Cardiovascular;  Laterality: Right;   Social History   Socioeconomic History  . Marital status: Married    Spouse name: Crosswicks   . Number of children: 1  . Years of education: Not on file  . Highest education level: Not on file  Occupational History  . Occupation: disability   Tobacco Use  . Smoking status: Current Every Day Smoker    Packs/day: 1.00    Years: 40.00    Pack years: 40.00    Types: Cigarettes  . Smokeless tobacco: Never Used  Vaping Use  . Vaping Use: Never used  Substance and Sexual Activity  . Alcohol use: Yes    Comment: 1/4 pint of vodka daily  . Drug use: Yes    Types: Marijuana  . Sexual activity: Yes  Other Topics Concern  . Not on file  Social History Narrative   Lives at home with wife and daughter    Social Determinants of Health   Financial Resource Strain:   . Difficulty of Paying Living Expenses:   Food Insecurity:   . Worried About Charity fundraiser in the Last Year:   . Arboriculturist in the Last Year:   Transportation Needs:   . Film/video editor (Medical):   Marland Kitchen Lack of Transportation (Non-Medical):   Physical Activity:   . Days of Exercise per Week:   . Minutes of Exercise per Session:   Stress:   . Feeling of Stress :  Social Connections:   . Frequency of Communication with Friends and Family:   . Frequency of Social Gatherings with Friends and Family:   . Attends Religious Services:   . Active Member of Clubs or Organizations:   . Attends Archivist Meetings:   Marland Kitchen Marital Status:   Intimate Partner Violence:   . Fear of Current or Ex-Partner:   . Emotionally Abused:   Marland Kitchen Physically Abused:   . Sexually Abused:    No Known Allergies  Last set of Vital Signs (not current) Vitals:   09/25/19 1900 09/25/19 2000  BP: 101/68 99/68  Pulse: 75 73  Resp: (!) 22 18  Temp:    SpO2: 94% 99%      Physical Exam  Gen: unresponsive Cardiovascular: pulseless  Resp: apneic. Breath sounds equal  bilaterally with bagging  Abd: nondistended  Neuro: GCS 3, unresponsive to pain  HEENT: No blood in posterior pharynx, gag reflex absent  Neck: No crepitus  Musculoskeletal: No deformity  Skin: warm  Procedures (when applicable, including Critical Care time): Procedures   MDM / Assessment and Plan Patient noted to be in V. fib.  Shocked with 200 J, patient had return of spontaneous circulation awake and alert.  Able to speak and says "I am good" thereafter.  Patient appears to have suffered a primary cardiac arrest with VT fib or V. tach.  He was resuscitated successfully with return to good neurologic function.  Bag-valve-mask utilized throughout this brief arrest.  He is alert speaking and able to speak clearly post resuscitation.  Ongoing care per ICU team including nurse practitioner who is at the bedside and agreeable with my return to the emergency room after resuscitation was successful    Delman Kitten, MD 09/26/19 0004

## 2019-09-25 NOTE — Progress Notes (Signed)
[place] for Electrolyte Monitoring and Replacement   Recent Labs: Potassium (mmol/L)  Date Value  09/25/2019 2.2 (LL)   Magnesium (mg/dL)  Date Value  09/25/2019 2.2   Calcium (mg/dL)  Date Value  09/25/2019 8.4 (L)   Albumin (g/dL)  Date Value  09/25/2019 3.2 (L)   Phosphorus (mg/dL)  Date Value  09/25/2019 1.2 (L)   Sodium (mmol/L)  Date Value  09/25/2019 133 (L)     Assessment: 59 year old male with weakness, emesis. Patient with significant electrolyte abnormalities.   Current Levels:  Phos 1.2 Mg 2.2 K 2.2  Goal of Therapy:  Electrolytes WNL, K ~ 4 and Mg ~ 2 in setting of afib  Plan:  Continue fluids with 60meq K.  No magnesium needed at this time.   Will replete phos with 30 mmol IV phos  Will also give KCL po 61meq.  Will recheck K, Mg, and Phos with am labs  Lu Duffel ,PharmD Clinical Pharmacist 09/25/2019 7:59 PM

## 2019-09-25 NOTE — Progress Notes (Signed)
Pharmacy Antibiotic Note  Alexander Welch is a 59 y.o. male admitted on 09/25/2019 with pneumonia.  Pharmacy has been consulted for zosyn dosing.  Plan: Zosyn 3.375g IV q8h (4 hour infusion).  Height: 5\' 7"  (170.2 cm) Weight: 68 kg (150 lb) IBW/kg (Calculated) : 66.1  Temp (24hrs), Avg:99.2 F (37.3 C), Min:99.1 F (37.3 C), Max:99.2 F (37.3 C)  Recent Labs  Lab 09/25/19 0608 09/25/19 1746 09/25/19 2054  WBC 8.7  --  6.4  CREATININE 1.38* 1.17 1.22    Estimated Creatinine Clearance: 60.2 mL/min (by C-G formula based on SCr of 1.22 mg/dL).    No Known Allergies   Thank you for allowing pharmacy to be a part of this patients care.  Tobie Lords, PharmD, BCPS Clinical Pharmacist 09/25/2019 11:05 PM

## 2019-09-25 NOTE — ED Notes (Signed)
Pt wife states that pt had "an accident"- pt pants and underwear removed and pt bottom cleaned

## 2019-09-25 NOTE — Code Documentation (Signed)
Subjective Called to patient's bedside for unresponsiveness at about 2120. Nurse indicated that she noted patient on monitor having a rhythm change from sinus rhythm to torsades.  She then went to check on the patient and the patient was unresponsive and CPR was initiated.  CPR was sustained for 2 minutes rhythm check indicated torsades.  Patient was defibrillated at 120 J and given 2 doses of epinephrine, and 4 g of magnesium.  Total downtime is approximately 10 minutes.  Patient regained consciousness and was able to follow commands and breathe on his own.  He was placed on a nonrebreather mask and code labs were obtained.  A right femoral central line was also placed.  Post resuscitation patient became hypotensive and was given fluid boluses as well as pressors.  His repeat labs showed less than 2, sodium of 134, chloride of 90, CO2 of 33, blood glucose of 230, calcium of 8.1, and a phosphorus level of 1.1.  His magnesium level had normalized at 2.8.  He is receiving multiple doses of potassium and phosphorus.  Post resuscitation he also vomited and an NG tube was placed in a moderate amount of bloody secretion was suctioned from his NG tube.  He has been started on a Protonix infusion and a GI consult and lab results is pending.  Objective Constitutional: Cachectic Appears cachetic, mild to moderate distress.  HENT: Normocephalic and atraumatic,  EOM are normal,  Pupils are equal, round, and reactive to light, normal range of motion and neck is supple; trachea in midline, OG tube in place Cardiovascular: Normal rate and regular rhythm, no murmur, gallop and no friction rub.  Pulmonary/Chest: Effort normal,  no wheezes, rhonchi or rails.  Trachea midline Abdominal: Soft, no  epigastric tenderness on palpation. No no guarding, rebound or referred rebound tenderness.  Left lower part of upper quadrant with large inguinal hernia Musculoskeletal: no edema or tenderness, mild venous stasis discoloration in  lower extremities.  Neurological: Unresponsive to voice touch and noxious stimulus, no corneal reflexes Skin: Skin is cool to touch  Assessment and plan  1.  Cardiopulmonary arrest-due to torsades-triggered by multiple electrolyte abnormalities and cardiomyopathy -successfully resuscitated.  Will discontinue amiodarone infusion in light of prolonged QTC.  Start lidocaine infusion per protocol.  Potassium, magnesium, calcium and phosphorus replaced.  Will monitor volume status closely in light of patient's cardiac history.  Continue pressors to maintain mean arterial blood pressure greater than 65.  Cycle cardiac enzymes and obtain serial EKGs.  Dr. Saralyn Pilar notified and agrees with current plan.  Dr. Patsey Berthold notified. 2.  Possible aspiration into airway: We will start on empiric antibiotics.  Start Zosyn per protocol.  Monitor fever curve.  Chest x-ray in the morning.  Continue supplemental oxygen to maintain SPO2 greater than 90.  3.  Severe electrolyte abnormalities due to alcohol abuse: We will monitor and replace electrolytes every 4 hours and as needed.  Repeat magnesium, potassium and calcium levels reviewed and additional doses ordered.  Right femoral central venous catheter placed for rapid infusion of potassium and other electrolytes as needed.  4.  Cardiac arrhythmias: Patient was in atrial fibrillation with rapid ventricular rate with runs of V. tach.  Continue serial EKGs, cardiac enzymes and lidocaine infusion.  Plan of care discussed with Dr. Patsey Berthold who agrees with current treatment plan.  Patient's family and spouse who is the responsible party updated at length.  All questions answered  Alexander Welch S. [name] ANP-BC Pulmonary and Critical Care Medicine [facility] Pager [phone] or  [phone]  NB: This document was prepared using Dragon voice recognition software and may include unintentional dictation errors.

## 2019-09-25 NOTE — ED Notes (Signed)
Patient denies chest pain.   Patient c/o weakness.  Patient reportedly had witnessed syncopal episodes at home.

## 2019-09-25 NOTE — ED Notes (Signed)
Report given to [facility], [place]

## 2019-09-25 NOTE — Progress Notes (Signed)
[place] for Electrolyte Monitoring and Replacement   Recent Labs: Potassium (mmol/L)  Date Value  09/25/2019 <2.0 (LL)   Magnesium (mg/dL)  Date Value  09/25/2019 1.3 (L)   Calcium (mg/dL)  Date Value  09/25/2019 8.7 (L)   Albumin (g/dL)  Date Value  09/25/2019 3.2 (L)   Sodium (mmol/L)  Date Value  09/25/2019 132 (L)     Assessment: 59 year old male with weakness, emesis. Patient with significant electrolyte abnormalities.   Goal of Therapy:  Electrolytes WNL, K ~ 4 and Mg ~ 2 in setting of afib  Plan:  Patient has received 50 mEq potassium so far. An additional 6 runs of 10 mEq IV ordered plus fluids with 20 K. Magnesium 2 g IV given with another 2 g ordered. Phosphorus ordered as add on to previous labs.  Will plan to check potassium around 1800 pending completion of potassium runs. Will plan for additional supplementation as indicated.  Tawnya Crook ,PharmD Clinical Pharmacist 09/25/2019 9:16 AM

## 2019-09-25 NOTE — ED Provider Notes (Signed)
[facility] Emergency Department Provider Note  ____________________________________________   First MD Initiated Contact with Patient 09/25/19 0622     (approximate)  I have reviewed the triage vital signs and the nursing notes.   HISTORY  Chief Complaint Weakness, Loss of Consciousness, and Emesis  Level 5 caveat:  history/ROS limited by acute/critical illness  HPI Alexander Welch is a 59 y.o. male with extensive past medical history as listed below which notably includes cardiomyopathy with an ejection fraction of about 20%, daily heavy alcohol abuse, CHF, etc. as listed below.  He presents for evaluation after a syncopal episode.  His wife is at bedside and provided most of the history because the patient does not remember.  Reportedly he has not had a drink since yesterday afternoon which is somewhat unusual for him.  Then early this morning he was sitting calmly and then had a large an acute onset and severe episode of emesis.  His wife asked him what was wrong and then he passed out and was unresponsive briefly.  Upon arrival to the emergency department he has a fast heart rate consistent with A. fib with RVR and occasional runs of what appears to be ventricular tachycardia.  He denies pain.  He says he does not remember what happened and he is very calm with no other complaints.  He denies fever/chills, sore throat, chest pain, shortness of breath, any persistent nausea, and abdominal pain.  Nothing particular made his symptoms better or worse than they were severe.     Past Medical History:  Diagnosis Date  . Abnormal CT scan, colon 11/27/2017  . Alcohol abuse   . Cardiomyopathy ([place])   . CHF (congestive heart failure) ([place])   . Colon polyps   . ED (erectile dysfunction)   . Hypertension   . Hypertriglyceridemia   . Osteoarthritis   . Osteoarthritis     Patient Active Problem List   Diagnosis Date Noted  . Lymphedema 07/27/2019  . PAD  (peripheral artery disease) ([place]) 06/21/2019  . Sleeping difficulty 05/24/2019  . Angina at rest [facility]) 05/13/2019  . Dyspnea on exertion 05/13/2019  . Excessive weight loss 05/13/2019  . Left arm pain 05/13/2019  . Superior mesenteric artery stenosis ([place]) 04/27/2019  . Tobacco use disorder 04/27/2019  . Thoracic aortic aneurysm ([place]) 04/27/2019  . Atherosclerosis of native arteries of extremity with intermittent claudication ([place]) 04/27/2019  . Difficulty balancing 03/23/2019  . Bilateral arm weakness 03/09/2019  . Bilateral hand numbness 03/09/2019  . Neck pain 03/09/2019  . Other symptoms and signs involving the musculoskeletal system 02/17/2019  . Tremor 02/17/2019  . Abnormal CT scan, colon 11/27/2017  . Alcoholism ([place]) 11/27/2017  . History of colon polyps 11/27/2017  . Low serum alkaline phosphatase 11/27/2017  . Hyponatremia 10/24/2017  . Primary osteoarthritis of left knee 07/24/2017  . Cardiomyopathy ([place]) 12/08/2013  . ED (erectile dysfunction) 12/08/2013  . Hypertension 12/08/2013  . Hypertriglyceridemia 12/08/2013    Past Surgical History:  Procedure Laterality Date  . COLONOSCOPY    . COLONOSCOPY WITH PROPOFOL N/A 02/02/2018   Procedure: COLONOSCOPY WITH PROPOFOL;  Surgeon: Manya Silvas, MD;  Location: [facility] ENDOSCOPY;  Service: Endoscopy;  Laterality: N/A;  . LOWER EXTREMITY ANGIOGRAPHY Right 06/21/2019   Procedure: LOWER EXTREMITY ANGIOGRAPHY;  Surgeon: Algernon Huxley, MD;  Location: [facility];  Service: Cardiovascular;  Laterality: Right;    Prior to Admission medications   Medication Sig Start Date End Date Taking? Authorizing Provider  aspirin EC 81 MG tablet Take 1 tablet by mouth daily.    [provider]  atorvastatin (LIPITOR) 10 MG tablet Take 1 tablet (10 mg total) by mouth daily. 06/21/19 06/20/20  Algernon Huxley, MD  carvedilol (COREG) 3.125 MG tablet Take by mouth. 05/11/19 05/10/20  [provider]  Cetirizine HCl 10 MG  CAPS Take by mouth daily.     [provider]  clopidogrel (PLAVIX) 75 MG tablet Take 1 tablet (75 mg total) by mouth daily. 06/21/19   Algernon Huxley, MD  fenofibrate 160 MG tablet Take 1 tablet by mouth daily. 01/09/17   [provider]  ferrous sulfate 325 (65 FE) MG tablet Take by mouth.    [provider]  folic acid (FOLVITE) 1 MG tablet Take 1 tablet (1 mg total) by mouth daily. 10/13/18   Lloyd Huger, MD  furosemide (LASIX) 20 MG tablet Take 1 tablet (20 mg total) by mouth 2 (two) times daily for 3 days. 08/11/19 08/14/19  Duffy Bruce, MD  gabapentin (NEURONTIN) 300 MG capsule Take 300 mg in the morning and 600 mg at night 05/25/19   [provider]  lisinopril-hydrochlorothiazide (PRINZIDE,ZESTORETIC) 20-12.5 MG tablet Take 1 tablet by mouth daily. 01/09/17   [provider]  potassium chloride 20 MEQ TBCR Take 20 mEq by mouth 2 (two) times daily for 3 days. 08/11/19 08/14/19  Duffy Bruce, MD  sacubitril-valsartan (ENTRESTO) 49-51 MG Take by mouth. 07/08/19   [provider]  sildenafil (REVATIO) 20 MG tablet Take 20 mg by mouth as needed.    [provider]  spironolactone (ALDACTONE) 25 MG tablet Take by mouth daily.  05/11/19   [provider]  vitamin B-12 (CYANOCOBALAMIN) 1000 MCG tablet Take by mouth.    [provider]    Allergies Patient has no known allergies.  Family History  Problem Relation Age of Onset  . Heart failure Mother   . Renal Disease Mother   . Bone cancer Father     Social History Social History   Tobacco Use  . Smoking status: Current Every Day Smoker    Packs/day: 1.00    Years: 40.00    Pack years: 40.00    Types: Cigarettes  . Smokeless tobacco: Never Used  Vaping Use  . Vaping Use: Never used  Substance Use Topics  . Alcohol use: Yes    Comment: 1/4 pint of vodka daily  . Drug use: Yes    Types: Marijuana    Review of Systems Level 5 caveat:  history/ROS  limited by acute/critical illness  ____________________________________________   PHYSICAL EXAM:  VITAL SIGNS: ED Triage Vitals  Enc Vitals Group     BP 09/25/19 0558 97/65     Pulse Rate 09/25/19 0558 (!) 51     Resp 09/25/19 0558 16     Temp 09/25/19 0558 99.1 F (37.3 C)     Temp Source 09/25/19 0558 Oral     SpO2 09/25/19 0558 94 %     Weight 09/25/19 0559 68 kg (150 lb)     Height 09/25/19 0559 1.702 m (5\' 7" )     Head Circumference --      Peak Flow --      Pain Score 09/25/19 0559 0     Pain Loc --      Pain Edu? --      Excl. in [place]? --     Constitutional: Alert and oriented.  Eyes: Conjunctivae are normal.  Head: Atraumatic. Nose: No congestion/rhinnorhea. Mouth/Throat: Patient is wearing a mask. Neck: No stridor.  No meningeal signs.   Cardiovascular: Irregularly irregular rhythm with a very fast rate ranging from about 140-190 with occasional runs of ventricular tachycardia on the monitor. Good peripheral circulation. Grossly normal heart sounds. Respiratory: Normal respiratory effort.  No retractions. Gastrointestinal: Soft and nontender. No distention.  Musculoskeletal: No lower extremity tenderness nor edema. No gross deformities of extremities. Neurologic:  Normal speech and language. No gross focal neurologic deficits are appreciated.  Skin:  Skin is warm, dry and intact. Psychiatric: Mood and affect are flat and blunted.  ____________________________________________   LABS (all labs ordered are listed, but only abnormal results are displayed)  Labs Reviewed  CBC - Abnormal; Notable for the following components:      Result Value   RBC 3.39 (*)    Hemoglobin 11.8 (*)    HCT 32.5 (*)    MCH 34.8 (*)    MCHC 36.3 (*)    All other components within normal limits  COMPREHENSIVE METABOLIC PANEL - Abnormal; Notable for the following components:   Sodium 132 (*)    Potassium <2.0 (*)    Chloride 82 (*)    Glucose, Bld 134 (*)    Creatinine, Ser 1.38  (*)    Calcium 8.7 (*)    Albumin 3.2 (*)    AST 81 (*)    Alkaline Phosphatase 35 (*)    Total Bilirubin 1.7 (*)    GFR calc non Af Amer 55 (*)    Anion gap 19 (*)    All other components within normal limits  LIPASE, BLOOD - Abnormal; Notable for the following components:   Lipase 84 (*)    All other components within normal limits  MAGNESIUM - Abnormal; Notable for the following components:   Magnesium 1.3 (*)    All other components within normal limits  ETHANOL - Abnormal; Notable for the following components:   Alcohol, Ethyl (B) 38 (*)    All other components within normal limits  POTASSIUM - Abnormal; Notable for the following components:   Potassium <2.0 (*)    All other components within normal limits  SARS CORONAVIRUS 2 BY RT PCR (HOSPITAL ORDER, [place] [facility])  TSH  T4, FREE  URINALYSIS, COMPLETE (UACMP) WITH MICROSCOPIC   ____________________________________________  EKG  ED ECG REPORT #1 I, Hinda Kehr, the attending physician, personally viewed and interpreted this ECG.  Date: 09/25/2019 EKG Time: 06:01 Rate: 163 Rhythm: A. fib with RVR with occasional aberrantly conducted complexes QRS Axis: normal Intervals: Atrial fibrillation with incomplete left bundle branch block ST/T Wave abnormalities: Non-specific ST segment / T-wave changes, possible rate related ischemia Narrative Interpretation: Concerning rhythm and rate, unlikely to represent STEMI.  ED ECG REPORT #2 I, Hinda Kehr, the attending physician, personally viewed and interpreted this ECG.  Date: 09/25/2019 EKG Time: 6:18 AM Rate: 162 Rhythm: Wide QRS tachycardia QRS Axis: normal Intervals: Abnormal secondary to A. fib, intraventricular conduction delay, possible atypical left bundle branch block, also with occasional aberrantly conducted beats. ST/T Wave abnormalities: Non-specific ST segment / T-wave changes, likely with some rate related ischemia Narrative  Interpretation: Concerning rate and rhythm as well as a wide QRS complex, possibly secondary to electrolyte abnormality.  Does not meet STEMI criteria.    ____________________________________________  RADIOLOGY I, Hinda Kehr, personally viewed and evaluated these images (plain radiographs) as part of my medical decision making, as well as reviewing the written report  by the radiologist.  ED MD interpretation: No indication for emergent imaging  Official radiology report(s): No results found.  ____________________________________________   PROCEDURES   Procedure(s) performed (including Critical Care):  .Critical Care Performed by: Hinda Kehr, MD Authorized by: Hinda Kehr, MD   Critical care provider statement:    Critical care time (minutes):  75   Critical care time was exclusive of:  Separately billable procedures and treating other patients   Critical care was necessary to treat or prevent imminent or life-threatening deterioration of the following conditions:  Circulatory failure and cardiac failure   Critical care was time spent personally by me on the following activities:  Development of treatment plan with patient or surrogate, discussions with consultants, evaluation of patient's response to treatment, examination of patient, obtaining history from patient or surrogate, ordering and performing treatments and interventions, ordering and review of laboratory studies, ordering and review of radiographic studies, pulse oximetry, re-evaluation of patient's condition and review of old charts .1-3 Lead EKG Interpretation Performed by: Hinda Kehr, MD Authorized by: Hinda Kehr, MD     Interpretation: abnormal     ECG rate:  155   ECG rate assessment: tachycardic     Rhythm: atrial fibrillation     Rhythm comment:  Occasional runs of v-tach   Ectopy: aberrant     Conduction: abnormal       ____________________________________________   INITIAL IMPRESSION /  MDM / ASSESSMENT AND PLAN / ED COURSE  As part of my medical decision making, I reviewed the following data within the electronic MEDICAL RECORD NUMBER History obtained from family, Nursing notes reviewed and incorporated, Labs reviewed , EKG interpreted , Old chart reviewed, Discussed with admitting physician , A consult was requested and obtained from this/these consultant(s) (ICU) and Notes from prior ED visits   Differential diagnosis includes, but is not limited to, A. fib with RVR, alcohol withdrawal, ACS, other nonspecific cardiac arrhythmia, electrolyte or metabolic abnormality.  I suspect the patient is hypomagnesemic at a minimum, other electrolyte abnormalities are also possible.  He is surprisingly asymptomatic at this time but his cardiac rhythm is extremely concerning.  He has borderline hypotensive which could be rate related.  Given the aberrant conduction and occasional runs of nonsustained ventricular tachycardia, I initiated treatment with amiodarone 150 mg IV bolus followed by the standard protocol of amiodarone 1 mg/kg/h infusion.  I ordered magnesium 2 g IV to be administered rapidly and will order another 2 g of magnesium to be given subsequently if in fact his magnesium level proves to be low.  He has been placed on the [place] with anterior and posterior pads.  He started getting a fluid bolus but then I discovered his low ejection fraction so I asked them to slow down the rate a bit but he still does need the fluids.  I ordered Ativan 1 mg IV given how long it has been since he had any alcohol and I am concerned about alcohol withdrawal contributing to the symptoms.  I discussed all this with the patient and his wife who is at bedside.  The patient is on the cardiac monitor to evaluate for evidence of arrhythmia and/or significant heart rate changes.     Clinical Course as of Sep 25 815  Sat Sep 25, 2019  5035 Already ordered magnesium 2 g IV.  Anticipate giving another 2 g IV once  the first dose is finished.  Magnesium(!): 1.3 [CF]  0704 Patient's potassium level as reported by the  lab is less than 2.  Given the severity of this result, I asked the nurses to send a repeat blood draw for another potassium level but I am initiating treatment with potassium 40 mEq by mouth as well as ordering IV potassium 34mEq/h x 6 doses.  This can be adjusted if the repeat potassium level is closer to normal.  I updated the patient and his wife.  CBC is within normal limits.  Lipase is slightly elevated which is likely to be expected given his comorbidities.   [CF]  [id number] The patient's repeat potassium is also less than 2.  I am contacting the ICU to speak with the provider to see if they feel it is an appropriate ICU admission versus hospitalist on stepdown.   [CF]  0719 I spoke by phone with Dr. Derrill Kay who is the ICU attending today.  We discussed the case and she is hopeful that he may be able to be managed on stepdown or progressive care unit by the hospitalist.  However given my concern that he is going to decompensate and require ICU level care, she will come to the ED to evaluate the patient, hopefully within the next 15 to 20 minutes.  At that point will make a determination about ICU versus hospitalist admission.   [CF]  [id number] I spoke in person with Dr. Patsey Berthold after she evaluated the patient in the emergency department.  She agrees with the management thus far and will admit the patient to the ICU for further care and treatment as well as for monitoring of complicated alcohol withdrawal.   [CF]    Clinical Course User Index [CF] Hinda Kehr, MD     ____________________________________________  FINAL CLINICAL IMPRESSION(S) / ED DIAGNOSES  Final diagnoses:  Atrial fibrillation with RVR ([place])  Hypokalemia  Hypomagnesemia  Alcohol abuse  Cardiomyopathy, unspecified type ([place])  Hypotension, unspecified hypotension type  Ventricular tachycardia ([place])     MEDICATIONS  GIVEN DURING THIS VISIT:  Medications  amiodarone (NEXTERONE PREMIX) 360-4.14 MG/200ML-% (1.8 mg/mL) IV infusion (60 mg/hr Intravenous New Bag/Given 09/25/19 0645)  amiodarone (NEXTERONE PREMIX) 360-4.14 MG/200ML-% (1.8 mg/mL) IV infusion (has no administration in time range)  potassium chloride 10 mEq in 100 mL IVPB (10 mEq Intravenous New Bag/Given 09/25/19 0725)  potassium chloride 10 mEq in 100 mL IVPB (has no administration in time range)  0.9 %  sodium chloride infusion (has no administration in time range)  norepinephrine (LEVOPHED) 4mg  in 230mL premix infusion (has no administration in time range)  magnesium sulfate IVPB 2 g 50 mL (has no administration in time range)  sodium chloride 0.9 % bolus 1,000 mL (1,000 mLs Intravenous New Bag/Given 09/25/19 0634)  amiodarone (NEXTERONE) IV bolus only 150 mg/100 mL (0 mg Intravenous Stopped 09/25/19 0656)  magnesium sulfate IVPB 2 g 50 mL (0 g Intravenous Stopped 09/25/19 0724)  LORazepam (ATIVAN) injection 1 mg (1 mg Intravenous Given 09/25/19 6761)  thiamine (B-1) injection 100 mg (100 mg Intravenous Given 09/25/19 0652)  potassium chloride SA (KLOR-CON) CR tablet 40 mEq (40 mEq Oral Given 09/25/19 9509)     ED Discharge Orders    None      *Please note:  Alexander Welch was evaluated in Emergency Department on 09/25/2019 for the symptoms described in the history of present illness. He was evaluated in the context of the global COVID-19 pandemic, which necessitated consideration that the patient might be at risk for infection with the SARS-CoV-2 virus that causes COVID-19. Institutional protocols and  algorithms that pertain to the evaluation of patients at risk for COVID-19 are in a state of rapid change based on information released by regulatory bodies including the CDC and federal and state organizations. These policies and algorithms were followed during the patient's care in the ED.  Some ED evaluations and interventions may be delayed as a  result of limited staffing during and after the pandemic.*  Note:  This document was prepared using Dragon voice recognition software and may include unintentional dictation errors.   Hinda Kehr, MD 09/25/19 [phone]

## 2019-09-25 NOTE — Consult Note (Signed)
Brief Pharmacy Note  Pharmacy has been consulted for lidocaine infusion for cardiac arrest/arrythmia  Unclear as to whether pt is with refractory ventricular fibrillation or pulseless tachycardia - if so, may repeat bolus every 5-10 minutes with a maximum cumulative dose of 3mg /kg.    Continuous infusion started at 2mg /minute (may titrate up to 4mg /min or down to 1mg /minute based on response)  Please call pharmacy for further consideration.  Lu Duffel, PharmD, BCPS Clinical Pharmacist 09/25/2019 9:08 PM

## 2019-09-25 NOTE — ED Triage Notes (Signed)
Pt to ED with family who report pt woke this morning and had 1 episode of vomiting and then a syncopal episode. Pt denies memory of this event but states he does feel weak. Pt drinks everyday but states, "I am trying to quit." Last drink was at 17:00 yesterday.   Family confirms the vomit did not have blood in it.

## 2019-09-25 NOTE — H&P (Signed)
Name: Alexander Welch MRN: [id number] DOB: [birth year]    ADMISSION DATE:  09/25/2019  REFERRING MD :    CHIEF COMPLAINT:  Syncope  BRIEF PATIENT DESCRIPTION: 59 y.o -old male  Presenting with syncope now admitted with afib with RVR/Vtach in the setting of hypokalemia and hypomagnesium  SIGNIFICANT EVENTS  6/19:  Admit to the ICU  STUDIES:  None  CULTURES: None  ANTIBIOTICS: None  HISTORY OF PRESENT ILLNESS: 59 year old male with past medical history of alcohol abuse, Ischemic cardiomyopathy, CHF, hypertension, hyperlipidemia, thoracic aortic aneurysm, tobacco use disorder, superior mesenteric artery stenosis, and PAD presenting to the ED with syncopal episode.  Per patient's significant other who is currently at the bedside.  Patient was sitting in the couch watching TV when he suddenly had an episode of intractable nausea and vomiting then suddenly passed out briefly.  Patient denies associated symptoms preceding the event of feeling cold or clammy, visual auras or blurry visions, palpitations, shortness of breath, or chest pain.  Following the event, patient significant other states that he appeared confused and fatigued and also had bladder incontinence.  On arrival to the ED, he was afebrile with blood pressure 77/58 mm Hg and pulse rate 138 -144 beats/min. There were no focal neurological deficits; he was alert and oriented x4, and he did not demonstrate any memory deficits.  Initial labs revealed sodium 132,K+ 2.0, creatinine 1.38 above baseline, albumin 3.2 AST 81, alk phos 35, t lipase 84, otal bili 1.7. EKG showed rhythm of atrial fibrillation with occasional runs of V. tach with a rate in the 155 bpm.  Due to his borderline hypotension and given the apparent conduction and occasional runs of nonsustained V. tach, patient was treated with amiodarone 150 mg IV bolus followed by amiodarone 1 mg per cake infusion.  He also received magnesium total of 4 g. PCCM team contacted for  ICU admission.  PAST MEDICAL HISTORY :   has a past medical history of Abnormal CT scan, colon (11/27/2017), Alcohol abuse, Cardiomyopathy ([place]), CHF (congestive heart failure) ([place]), Colon polyps, ED (erectile dysfunction), Hypertension, Hypertriglyceridemia, Osteoarthritis, and Osteoarthritis.  has a past surgical history that includes Colonoscopy; Colonoscopy with propofol (N/A, 02/02/2018); and Lower Extremity Angiography (Right, 06/21/2019). Prior to Admission medications   Medication Sig Start Date End Date Taking? Authorizing Provider  aspirin EC 81 MG tablet Take 81 mg by mouth daily.    Yes [provider]  atorvastatin (LIPITOR) 10 MG tablet Take 1 tablet (10 mg total) by mouth daily. 06/21/19 06/20/20 Yes Dew, Erskine Squibb, MD  carvedilol (COREG) 3.125 MG tablet Take 3.125 mg by mouth 2 (two) times daily with a meal.    Yes [provider]  cetirizine (ZYRTEC) 10 MG tablet Take 10 mg by mouth daily.    Yes [provider]  clopidogrel (PLAVIX) 75 MG tablet Take 1 tablet (75 mg total) by mouth daily. 06/21/19  Yes Algernon Huxley, MD  fenofibrate 160 MG tablet Take 160 mg by mouth daily.    Yes [provider]  ferrous sulfate 325 (65 FE) MG tablet Take 325 mg by mouth daily with breakfast.    Yes [provider]  folic acid (FOLVITE) 1 MG tablet Take 1 tablet (1 mg total) by mouth daily. 10/13/18  Yes Lloyd Huger, MD  furosemide (LASIX) 20 MG tablet Take 20 mg by mouth daily as needed for edema.   Yes [provider]  gabapentin (NEURONTIN) 300 MG capsule Take 300-600 mg by  mouth See admin instructions. Take 1 capsule (331m) by mouth ever morning and take 2 capsules (6054m by mouth every night   Yes [provider]  PARoxetine (PAXIL) 10 MG tablet Take 10 mg by mouth daily.   Yes [provider]  potassium chloride SA (KLOR-CON) 20 MEQ tablet Take 20 mEq by mouth daily.   Yes [provider]   sacubitril-valsartan (ENTRESTO) 49-51 MG Take 1 tablet by mouth 2 (two) times daily.  07/08/19  Yes [provider]  sildenafil (REVATIO) 20 MG tablet Take 40-60 mg by mouth as needed.    Yes [provider]  spironolactone (ALDACTONE) 25 MG tablet Take 12.5 mg by mouth daily.    Yes [provider]  vitamin B-12 (CYANOCOBALAMIN) 1000 MCG tablet Take 1,000 mcg by mouth daily.    Yes [provider]   No Known Allergies  FAMILY HISTORY:  family history includes Bone cancer in his father; Heart failure in his mother; Renal Disease in his mother. SOCIAL HISTORY:  reports that he has been smoking cigarettes. He has a 40.00 pack-year smoking history. He has never used smokeless tobacco. He reports current alcohol use. He reports current drug use. Drug: Marijuana.   REVIEW OF SYSTEMS:   Constitutional: Negative for fever, chills, , malaise/fatigue and diaphoresis. positive for weight loss HENT: Negative for hearing loss, ear pain, nosebleeds, congestion, sore throat, neck pain, tinnitus and ear discharge.   Eyes: Negative for blurred vision, double vision, photophobia, pain, discharge and redness.  Respiratory: Negative for cough, hemoptysis, sputum production, shortness of breath, wheezing and stridor.   Cardiovascular: Negative for chest pain, palpitations, orthopnea, claudication, leg swelling and PND.  Gastrointestinal: Negative for heartburn, nausea, vomiting, abdominal pain, diarrhea, constipation, blood in stool and melena.  Genitourinary: Negative for dysuria, urgency, frequency, hematuria and flank pain.  Musculoskeletal: Negative for myalgias, back pain, joint pain and falls.  Skin: Negative for itching and rash.  Neurological: Negative for dizziness, tingling, tremors, sensory change, speech change, focal weakness, seizures, loss of consciousness, weakness and headaches.  Endo/Heme/Allergies: Negative for environmental allergies and polydipsia. Does  not bruise/bleed easily.  PHYSICAL EXAMINATION: VITAL SIGNS: Temp:  [99.1 F (37.3 C)] 99.1 F (37.3 C) (06/19 0558) Pulse Rate:  [51] 51 (06/19 0558) Resp:  [16-23] 22 (06/19 1100) BP: (71-108)/(55-92) 94/73 (06/19 1100) SpO2:  [94 %] 94 % (06/19 0558) Weight:  [68 kg] 68 kg (06/19 0559)  GENERAL: [age]ear old patient lying in the bed with no acute distress.  EYES: Pupils equal, round, reactive to light and accommodation. No scleral icterus. Extraocular muscles intact.  HEENT: Head atraumatic, normocephalic. Oropharynx and nasopharynx clear.  NECK:  Supple, no jugular venous distention. No thyroid enlargement, no tenderness.  LUNGS: Normal breath sounds bilaterally, no wheezing, rales,rhonchi or crepitation. No use of accessory muscles of respiration.  CARDIOVASCULAR: S1, S2 normal. Murmurs present, rubs, or gallops.  ABDOMEN: Soft, nontender, nondistended. Bowel sounds present. No organomegaly or mass.  EXTREMITIES: No pedal edema, cyanosis, or clubbing.  NEUROLOGIC: Cranial nerves II through XII are intact. Muscle strength 5/5 in all extremities. Sensation intact. Gait not checked.  PSYCHIATRIC: The patient is alert and oriented x 3.  SKIN: No obvious rash, lesion, or ulcer.   Recent Labs  Lab 09/25/19 0608 09/25/19 0643  NA 132*  --   K <2.0* <2.0*  CL 82*  --   CO2 31  --   BUN 8  --   CREATININE 1.38*  --   GLUCOSE 134*  --  Recent Labs  Lab 09/25/19 0608  HGB 11.8*  HCT 32.5*  WBC 8.7  PLT 153   No results found.  ASSESSMENT / PLAN:  Syncope - Likely vasovagal but will exclude other causes in a patient with significant cardiac hx including nonischemic dilated cardiomyopathy - Admit to stepdown  - Telemetry + Pulse Ox monitoring - Check Orthostatic Vital Signs - Review Electrolytes - Cardiac Echo - Blood sugar QAC/HS - U Tox + EtOH level  Afib with RVR + Nonsustained Vtach - Likely transient in the setting of electrolyte abnormalities -  EKG+Telemetry - Troponins - TSH, FT4 - EtOH, UTox as above - TTEcho as above - Amiodarone 162m IV bolus, then 161mmin x6 hrs, overlap PO with drip  20064mO daily per cardiology recommendations - Cardiology input appreciated  ETOH abuse - High risk for withdrawal - Check CMP, INR, Daily BMP+Mg - Daily Thiamine, Folate, MVI once tolerating PO - Seizure precautions - SW consult for cessation resources - PT/OT evaluation for mobility  Hypokalemia + Hypomagnesium -Follow serial potassium +mag -Replace per protocol   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 -Cardiology following, appreciate input -Repeat 2D Echocardiogram as above     Best Practice/Protocols:  VTE px:OF:PULGvenox SUP px:IV Protonix Diet:Regular Diet    EliRufina FalcoNP, CCRN, FNP-BC [facility]    09/25/2019, 11:26 AM

## 2019-09-25 NOTE — Progress Notes (Signed)
   09/25/19 2020  Clinical Encounter Type  Visited With Patient  Visit Type Initial;Spiritual support;Social support  Referral From Nurse  Consult/Referral To Chaplain  Ch responded to a code blue page in ICU. Staff worked on Pt and was able to bring Pt back. Ch found family and let them know what was going on. Pt is stable and Ch will follow-up with Pt and family.

## 2019-09-25 NOTE — Consult Note (Signed)
[facility] Cardiology  CARDIOLOGY CONSULT NOTE  Patient ID: Alexander Welch MRN: [id number] DOB/AGE: 07-Jun-1960 59 y.o.  Admit date: 09/25/2019 Referring Physician Patsey Berthold Primary Physician [place] Primary Cardiologist [facility] Reason for Consultation ventricular tachycardia  HPI: 59 year old gentleman referred for evaluation of ventricular tachycardia and atrial fibrillation with rapid ventricular rate.  The patient has a history of EtOH abuse, typically has 4-5 shots of vodka daily.  Patient has chronic systolic congestive heart failure with known nonischemic dilated cardiomyopathy, with LVEF 25 to 30% by echocardiogram 12/24/2017.  Early this morning, the patient had an episode of emesis, became unresponsive briefly, brought to [facility] emergency room where ECG revealed atrial fibrillation with rapid ventricular rate, with intermittent runs of tachycardia on telemetry.  Patient treated with amiodarone bolus and infusion.  Admission labs notable for potassium less than 2.0 and magnesium 1.3.  Patient treated with intravenous fluids, with potassium and magnesium repletion.  Patient currently in sinus rhythm, denies chest pain or shortness of breath.  Review of systems complete and found to be negative unless listed above     Past Medical History:  Diagnosis Date  . Abnormal CT scan, colon 11/27/2017  . Alcohol abuse   . Cardiomyopathy ([place])   . CHF (congestive heart failure) ([place])   . Colon polyps   . ED (erectile dysfunction)   . Hypertension   . Hypertriglyceridemia   . Osteoarthritis   . Osteoarthritis     Past Surgical History:  Procedure Laterality Date  . COLONOSCOPY    . COLONOSCOPY WITH PROPOFOL N/A 02/02/2018   Procedure: COLONOSCOPY WITH PROPOFOL;  Surgeon: Manya Silvas, MD;  Location: [facility] ENDOSCOPY;  Service: Endoscopy;  Laterality: N/A;  . LOWER EXTREMITY ANGIOGRAPHY Right 06/21/2019   Procedure: LOWER EXTREMITY ANGIOGRAPHY;  Surgeon: Algernon Huxley, MD;  Location: [facility] CV LAB;  Service: Cardiovascular;  Laterality: Right;    (Not in a hospital admission)  Social History   Socioeconomic History  . Marital status: Married    Spouse name: Rainbow   . Number of children: 1  . Years of education: Not on file  . Highest education level: Not on file  Occupational History  . Occupation: disability   Tobacco Use  . Smoking status: Current Every Day Smoker    Packs/day: 1.00    Years: 40.00    Pack years: 40.00    Types: Cigarettes  . Smokeless tobacco: Never Used  Vaping Use  . Vaping Use: Never used  Substance and Sexual Activity  . Alcohol use: Yes    Comment: 1/4 pint of vodka daily  . Drug use: Yes    Types: Marijuana  . Sexual activity: Yes  Other Topics Concern  . Not on file  Social History Narrative   Lives at home with wife and daughter    Social Determinants of Health   Financial Resource Strain:   . Difficulty of Paying Living Expenses:   Food Insecurity:   . Worried About Charity fundraiser in the Last Year:   . Arboriculturist in the Last Year:   Transportation Needs:   . Film/video editor (Medical):   Marland Kitchen Lack of Transportation (Non-Medical):   Physical Activity:   . Days of Exercise per Week:   . Minutes of Exercise per Session:   Stress:   . Feeling of Stress :   Social Connections:   . Frequency of Communication with Friends and Family:   . Frequency of Social Gatherings with Friends and  Family:   . Attends Religious Services:   . Active Member of Clubs or Organizations:   . Attends Archivist Meetings:   Marland Kitchen Marital Status:   Intimate Partner Violence:   . Fear of Current or Ex-Partner:   . Emotionally Abused:   Marland Kitchen Physically Abused:   . Sexually Abused:     Family History  Problem Relation Age of Onset  . Heart failure Mother   . Renal Disease Mother   . Bone cancer Father       Review of systems complete and found to be negative unless listed above      PHYSICAL  EXAM  General: Well developed, well nourished, in no acute distress HEENT:  Normocephalic and atramatic Neck:  No JVD.  Lungs: Clear bilaterally to auscultation and percussion. Heart: HRRR . Normal S1 and S2 without gallops or murmurs.  Abdomen: Bowel sounds are positive, abdomen soft and non-tender  Msk:  Back normal, normal gait. Normal strength and tone for age. Extremities: No clubbing, cyanosis or edema.   Neuro: Alert and oriented X 3. Psych:  Good affect, responds appropriately  Labs:   Lab Results  Component Value Date   WBC 8.7 09/25/2019   HGB 11.8 (L) 09/25/2019   HCT 32.5 (L) 09/25/2019   MCV 95.9 09/25/2019   PLT 153 09/25/2019    Recent Labs  Lab 09/25/19 0608 09/25/19 0608 09/25/19 0643  NA 132*  --   --   K <2.0*   < > <2.0*  CL 82*  --   --   CO2 31  --   --   BUN 8  --   --   CREATININE 1.38*  --   --   CALCIUM 8.7*  --   --   PROT 6.7  --   --   BILITOT 1.7*  --   --   ALKPHOS 35*  --   --   ALT 34  --   --   AST 81*  --   --   GLUCOSE 134*  --   --    < > = values in this interval not displayed.   No results found for: CKTOTAL, CKMB, CKMBINDEX, TROPONINI No results found for: CHOL No results found for: HDL No results found for: LDLCALC No results found for: TRIG No results found for: CHOLHDL No results found for: LDLDIRECT    Radiology: No results found.  EKG: Atrial fibrillation with rapid ventricular rate  ASSESSMENT AND PLAN:   1.  Nonsustained ventricular tachycardia, likely triggered by electrolyte abnormalities, no recurrence on amiodarone infusion 2.  Atrial fibrillation with rapid ventricular rate, in the setting of electrolyte abnormalities, converted to sinus rhythm on amiodarone infusion 3.  Nonischemic dilated cardiomyopathy, with LVEF 25 to 30% 4.  Chronic systolic congestive heart failure, patient appears euvolemic 5.  Hypokalemia, in the setting of EtOH abuse 6.  Hypomagnesemia 7.  EtOH abuse  Recommendations  1.   Agree with current therapy 2.  Continue amiodarone infusion for now, may transition to amiodarone 200 mg p.o. twice daily after completing 1 g IV load, and electrolyte correction 3.  Continue repletion of potassium and magnesium 4.  Defer anticoagulation for atrial fibrillation 5.  Resume carvedilol, Entresto and and spironolactone 6.  Strongly advised patient to abstain from alcohol  Signed: Isaias Cowman MD,PhD, [facility] 09/25/2019, 11:26 AM

## 2019-09-26 ENCOUNTER — Inpatient Hospital Stay: Payer: BC Managed Care – PPO

## 2019-09-26 ENCOUNTER — Inpatient Hospital Stay
Admit: 2019-09-26 | Discharge: 2019-09-26 | Disposition: A | Discharge status: Home / Self Care | Payer: BC Managed Care – PPO | Attending: Pulmonary Disease | Admitting: Pulmonary Disease

## 2019-09-26 DIAGNOSIS — J9600 Acute respiratory failure, unspecified whether with hypoxia or hypercapnia: Secondary | ICD-10-CM

## 2019-09-26 DIAGNOSIS — I4891 Unspecified atrial fibrillation: Secondary | ICD-10-CM

## 2019-09-26 DIAGNOSIS — J9601 Acute respiratory failure with hypoxia: Secondary | ICD-10-CM

## 2019-09-26 DIAGNOSIS — F1010 Alcohol abuse, uncomplicated: Secondary | ICD-10-CM

## 2019-09-26 LAB — CBC
HCT: 27.1 % — ABNORMAL LOW (ref 39.0–52.0)
HCT: 26.9 % — ABNORMAL LOW (ref 39.0–52.0)
Hemoglobin: 9.3 g/dL — ABNORMAL LOW (ref 13.0–17.0)
Hemoglobin: 9.4 g/dL — ABNORMAL LOW (ref 13.0–17.0)
MCH: 34.1 pg — ABNORMAL HIGH (ref 26.0–34.0)
MCH: 34.2 pg — ABNORMAL HIGH (ref 26.0–34.0)
MCHC: 34.3 g/dL (ref 30.0–36.0)
MCHC: 34.9 g/dL (ref 30.0–36.0)
MCV: 99.3 fL (ref 80.0–100.0)
MCV: 97.8 fL (ref 80.0–100.0)
Platelets: 127 10*3/uL — ABNORMAL LOW (ref 150–400)
Platelets: 143 10*3/uL — ABNORMAL LOW (ref 150–400)
RBC: 2.73 MIL/uL — ABNORMAL LOW (ref 4.22–5.81)
RBC: 2.75 MIL/uL — ABNORMAL LOW (ref 4.22–5.81)
RDW: 11.9 % (ref 11.5–15.5)
RDW: 11.9 % (ref 11.5–15.5)
WBC: 7.4 10*3/uL (ref 4.0–10.5)
WBC: 9.3 10*3/uL (ref 4.0–10.5)
nRBC: 0 % (ref 0.0–0.2)
nRBC: 0 % (ref 0.0–0.2)

## 2019-09-26 LAB — ECHOCARDIOGRAM COMPLETE
Height: 67 in
Weight: 2610.25 oz

## 2019-09-26 LAB — COMPREHENSIVE METABOLIC PANEL
ALT: 46 U/L — ABNORMAL HIGH (ref 0–44)
AST: 145 U/L — ABNORMAL HIGH (ref 15–41)
Albumin: 2.5 g/dL — ABNORMAL LOW (ref 3.5–5.0)
Alkaline Phosphatase: 29 U/L — ABNORMAL LOW (ref 38–126)
Anion gap: 14 (ref 5–15)
BUN: 8 mg/dL (ref 6–20)
CO2: 30 mmol/L (ref 22–32)
Calcium: 7.8 mg/dL — ABNORMAL LOW (ref 8.9–10.3)
Chloride: 90 mmol/L — ABNORMAL LOW (ref 98–111)
Creatinine, Ser: 1.17 mg/dL (ref 0.61–1.24)
GFR calc Af Amer: >60 mL/min (ref >60)
GFR calc non Af Amer: >60 mL/min (ref >60)
Glucose, Bld: 251 mg/dL — ABNORMAL HIGH (ref 70–99)
Potassium: 2.8 mmol/L — ABNORMAL LOW (ref 3.5–5.1)
Sodium: 134 mmol/L — ABNORMAL LOW (ref 135–145)
Total Bilirubin: 1.3 mg/dL — ABNORMAL HIGH (ref 0.3–1.2)
Total Protein: 5.6 g/dL — ABNORMAL LOW (ref 6.5–8.1)

## 2019-09-26 LAB — PHOSPHORUS: Phosphorus: 3.1 mg/dL (ref 2.5–4.6)

## 2019-09-26 LAB — GLUCOSE, CAPILLARY
Glucose-Capillary: 283 mg/dL — ABNORMAL HIGH (ref 70–99)
Glucose-Capillary: 221 mg/dL — ABNORMAL HIGH (ref 70–99)
Glucose-Capillary: 121 mg/dL — ABNORMAL HIGH (ref 70–99)
Glucose-Capillary: 120 mg/dL — ABNORMAL HIGH (ref 70–99)
Glucose-Capillary: 102 mg/dL — ABNORMAL HIGH (ref 70–99)
Glucose-Capillary: 131 mg/dL — ABNORMAL HIGH (ref 70–99)

## 2019-09-26 LAB — HEMOGLOBIN A1C
Hgb A1c MFr Bld: 5.1 % (ref 4.8–5.6)
Mean Plasma Glucose: 99.67 mg/dL

## 2019-09-26 LAB — HIV ANTIBODY (ROUTINE TESTING W REFLEX): HIV Screen 4th Generation wRfx: NONREACTIVE

## 2019-09-26 LAB — BASIC METABOLIC PANEL
Anion gap: 8 (ref 5–15)
Anion gap: 11 (ref 5–15)
BUN: 7 mg/dL (ref 6–20)
BUN: 9 mg/dL (ref 6–20)
CO2: 34 mmol/L — ABNORMAL HIGH (ref 22–32)
CO2: 33 mmol/L — ABNORMAL HIGH (ref 22–32)
Calcium: 7.6 mg/dL — ABNORMAL LOW (ref 8.9–10.3)
Calcium: 7.5 mg/dL — ABNORMAL LOW (ref 8.9–10.3)
Chloride: 91 mmol/L — ABNORMAL LOW (ref 98–111)
Chloride: 92 mmol/L — ABNORMAL LOW (ref 98–111)
Creatinine, Ser: 1.13 mg/dL (ref 0.61–1.24)
Creatinine, Ser: 1.25 mg/dL — ABNORMAL HIGH (ref 0.61–1.24)
GFR calc Af Amer: >60 mL/min (ref >60)
GFR calc Af Amer: >60 mL/min (ref >60)
GFR calc non Af Amer: >60 mL/min (ref >60)
GFR calc non Af Amer: >60 mL/min (ref >60)
Glucose, Bld: 254 mg/dL — ABNORMAL HIGH (ref 70–99)
Glucose, Bld: 129 mg/dL — ABNORMAL HIGH (ref 70–99)
Potassium: 2.4 mmol/L — CL (ref 3.5–5.1)
Potassium: 2.7 mmol/L — CL (ref 3.5–5.1)
Sodium: 133 mmol/L — ABNORMAL LOW (ref 135–145)
Sodium: 136 mmol/L (ref 135–145)

## 2019-09-26 LAB — MAGNESIUM: Magnesium: 2.8 mg/dL — ABNORMAL HIGH (ref 1.7–2.4)

## 2019-09-26 LAB — TROPONIN I (HIGH SENSITIVITY): Troponin I (High Sensitivity): 148 ng/L (ref <18)

## 2019-09-26 LAB — BRAIN NATRIURETIC PEPTIDE: B Natriuretic Peptide: 3002.2 pg/mL — ABNORMAL HIGH (ref 0.0–100.0)

## 2019-09-26 LAB — PROCALCITONIN: Procalcitonin: 0.3 ng/mL

## 2019-09-26 MED ORDER — PERFLUTREN LIPID MICROSPHERE
1.0000 mL | INTRAVENOUS | Status: AC | PRN
  Administered 2019-09-26: 4 mL via INTRAVENOUS
  Filled 2019-09-26: qty 10

## 2019-09-26 MED ORDER — DEXMEDETOMIDINE HCL IN NACL 400 MCG/100ML IV SOLN
0.4000 ug/kg/h | INTRAVENOUS | Status: DC
  Administered 2019-09-26 – 2019-09-27 (×2): 0.4 ug/kg/h via INTRAVENOUS
  Filled 2019-09-26 (×2): qty 100

## 2019-09-26 MED ORDER — POTASSIUM CHLORIDE 10 MEQ/100ML IV SOLN
10.0000 meq | INTRAVENOUS | Status: AC
  Administered 2019-09-26 – 2019-09-27 (×6): 10 meq via INTRAVENOUS
  Filled 2019-09-26 (×6): qty 100

## 2019-09-26 MED ORDER — ETHACRYNATE SODIUM 50 MG IV SOLR
50.0000 mg | Freq: Once | INTRAVENOUS | Status: AC
  Administered 2019-09-26: 50 mg via INTRAVENOUS
  Filled 2019-09-26: qty 50

## 2019-09-26 MED ORDER — POTASSIUM CHLORIDE 10 MEQ/100ML IV SOLN
10.0000 meq | INTRAVENOUS | Status: AC
  Administered 2019-09-26 (×4): 10 meq via INTRAVENOUS
  Filled 2019-09-26 (×4): qty 100

## 2019-09-26 MED ORDER — CHLORHEXIDINE GLUCONATE CLOTH 2 % EX PADS
6.0000 | MEDICATED_PAD | Freq: Every day | CUTANEOUS | Status: DC
  Administered 2019-09-27 – 2019-10-02 (×6): 6 via TOPICAL

## 2019-09-26 MED ORDER — PNEUMOCOCCAL VAC POLYVALENT 25 MCG/0.5ML IJ INJ
0.5000 mL | INJECTION | INTRAMUSCULAR | Status: AC
  Administered 2019-09-27: 0.5 mL via INTRAMUSCULAR
  Filled 2019-09-26: qty 0.5

## 2019-09-26 NOTE — Progress Notes (Signed)
Pharmacy Antibiotic Note  Alexander Welch is a 59 y.o. male admitted on 09/25/2019 with pneumonia.  Pharmacy has been consulted for zosyn dosing.  Plan: Zosyn 3.375g IV q8h (4 hour infusion).  Height: 5\' 7"  (170.2 cm) Weight: 74 kg (163 lb 2.3 oz) IBW/kg (Calculated) : 66.1  Temp (24hrs), Avg:98.7 F (37.1 C), Min:98 F (36.7 C), Max:99.2 F (37.3 C)  Recent Labs  Lab 09/25/19 0608 09/25/19 1746 09/25/19 2054 09/26/19 0050 09/26/19 0602  WBC 8.7  --  6.4 7.4 9.3  CREATININE 1.38* 1.17 1.22 1.13 1.17    Estimated Creatinine Clearance: 62.8 mL/min (by C-G formula based on SCr of 1.17 mg/dL).    No Known Allergies   Thank you for allowing pharmacy to be a part of this patient's care.  Eleonore Chiquito, PharmD, BCPS Clinical Pharmacist 09/26/2019 9:19 AM

## 2019-09-26 NOTE — Progress Notes (Signed)
MD notified about potassium result, pharmacy is consulted for electrolyte replacement and will add new orders.

## 2019-09-26 NOTE — Progress Notes (Addendum)
[place] for Electrolyte Monitoring and Replacement   Recent Labs: Potassium (mmol/L)  Date Value  09/26/2019 2.8 (L)   Magnesium (mg/dL)  Date Value  09/26/2019 2.8 (H)   Calcium (mg/dL)  Date Value  09/26/2019 7.8 (L)   Albumin (g/dL)  Date Value  09/26/2019 2.5 (L)   Phosphorus (mg/dL)  Date Value  09/26/2019 3.1   Sodium (mmol/L)  Date Value  09/26/2019 134 (L)     Assessment: 59 year old male with weakness, emesis. Patient with significant electrolyte abnormalities. amio d/c'ed, on lidocaine infusion,   Goal of Therapy:  Electrolytes WNL, K ~ 4 and Mg ~ 2 in setting of afib  Plan:  Will give KCL 10 mEq x 4 IV. Recheck K+ at 1800.   Will recheck BMP with AM labs.   Oswald Hillock ,PharmD, BCPS Clinical Pharmacist 09/26/2019 9:16 AM

## 2019-09-26 NOTE — Progress Notes (Signed)
[place] for Electrolyte Monitoring and Replacement   Recent Labs: Potassium (mmol/L)  Date Value  09/26/2019 2.7 (LL)   Magnesium (mg/dL)  Date Value  09/26/2019 2.8 (H)   Calcium (mg/dL)  Date Value  09/26/2019 7.5 (L)   Albumin (g/dL)  Date Value  09/26/2019 2.5 (L)   Phosphorus (mg/dL)  Date Value  09/26/2019 3.1   Sodium (mmol/L)  Date Value  09/26/2019 136     Assessment: 59 year old male with weakness, emesis. Patient with significant electrolyte abnormalities. amio d/c'ed, on lidocaine infusion,   Goal of Therapy:  Electrolytes WNL, K ~ 4 and Mg ~ 2 in setting of afib  Plan:  Will give KCL 10 mEq x 6 IV   Will recheck BMP and Mg with AM labs.   Lu Duffel ,PharmD, BCPS Clinical Pharmacist 09/26/2019 7:21 PM

## 2019-09-26 NOTE — Progress Notes (Addendum)
Follow up - Critical Care Medicine Note  Patient Details:    Alexander Welch is an 59 y.o. male with significant PMH of alcohol abuse, nonischemic cardiomyopathy, CHF, hypertension, hyperlipidemia, thoracic aortic aneurysm, tobacco use disorder, superior mesenteric artery stenosis, and PAD admitted with Vtach/Vfib secondary to severe electrolytes derangements from alcohol abuse.  Lines/tubes : CVC Triple Lumen 09/25/19 Right Femoral (Active)  Indication for Insertion or Continuance of Line Vasoactive infusions 09/26/19 0000  Site Assessment Clean;Dry;Intact 09/26/19 0000  Proximal Lumen Status Infusing 09/26/19 0000  Medial Lumen Status Infusing 09/26/19 0000  Distal Lumen Status Infusing 09/26/19 0000  Dressing Type Transparent 09/25/19 2200  Dressing Status Clean;Dry;Intact 09/25/19 2200  Line Care Connections checked and tightened 09/25/19 2200  Dressing Change Due 10/02/19 09/25/19 2200     NG/OG Tube Nasogastric 14 Fr. Left nare Xray Measured external length of tube (Active)  Output (mL) 300 mL 09/26/19 0400     External Urinary Catheter (Active)  Collection Container Standard drainage bag 09/26/19 0400  Site Assessment Clean;Intact 09/25/19 2000  Output (mL) 300 mL 09/26/19 0512    Microbiology/Sepsis markers: Results for orders placed or performed during the hospital encounter of 09/25/19  MRSA PCR Screening     Status: None   Collection Time: 09/25/19  6:07 AM   Specimen: Nasopharyngeal  Result Value Ref Range Status   MRSA by PCR NEGATIVE NEGATIVE Final    Comment:        The GeneXpert MRSA Assay (FDA approved for NASAL specimens only), is one component of a comprehensive MRSA colonization surveillance program. It is not intended to diagnose MRSA infection nor to guide or monitor treatment for MRSA infections. Performed at [facility], [address] [place], [place] [address]   SARS Coronavirus 2 by RT PCR (hospital order, performed in [facility]  hospital lab) Nasopharyngeal Nasopharyngeal Swab     Status: None   Collection Time: 09/25/19  9:00 AM   Specimen: Nasopharyngeal Swab  Result Value Ref Range Status   SARS Coronavirus 2 NEGATIVE NEGATIVE Final    Comment: (NOTE) SARS-CoV-2 target nucleic acids are NOT DETECTED.  The SARS-CoV-2 RNA is generally detectable in upper and lower respiratory specimens during the acute phase of infection. The lowest concentration of SARS-CoV-2 viral copies this assay can detect is 250 copies / mL. A negative result does not preclude SARS-CoV-2 infection and should not be used as the sole basis for treatment or other patient management decisions.  A negative result may occur with improper specimen collection / handling, submission of specimen other than nasopharyngeal swab, presence of viral mutation(s) within the areas targeted by this assay, and inadequate number of viral copies (<250 copies / mL). A negative result must be combined with clinical observations, patient history, and epidemiological information.  Fact Sheet for Patients:   StrictlyIdeas.no  Fact Sheet for Healthcare Providers: BankingDealers.co.za  This test is not yet approved or  cleared by the [place] FDA and has been authorized for detection and/or diagnosis of SARS-CoV-2 by FDA under an Emergency Use Authorization (EUA).  This EUA will remain in effect (meaning this test can be used) for the duration of the COVID-19 declaration under Section 564(b)(1) of the Act, 21 U.S.C. section 360bbb-3(b)(1), unless the authorization is terminated or revoked sooner.  Performed at [facility], [address] [place], [place] [address]     Anti-infectives:  Anti-infectives (From admission, onward)   Start     Dose/Rate Route Frequency Ordered Stop  09/25/19 2315  piperacillin-tazobactam (ZOSYN) IVPB 3.375 g     Discontinue     3.375 g 12.5 mL/hr over 240  Minutes Intravenous Every 8 hours 09/25/19 2305       Scheduled Meds:  Chlorhexidine Gluconate Cloth  6 each Topical Daily   enoxaparin (LOVENOX) injection  40 mg Subcutaneous Q24H   insulin aspart  0-6 Units Subcutaneous Q4H   mouth rinse  15 mL Mouth Rinse BID   nicotine  21 mg Transdermal Daily   [START ON 09/29/2019] pantoprazole  40 mg Intravenous Q12H   Continuous Infusions:  sodium chloride 10 mL/hr at 09/25/19 1600   sodium chloride Stopped (09/25/19 1211)   lidocaine 2 mg/min (09/26/19 0500)   norepinephrine (LEVOPHED) Adult infusion 28 mcg/min (09/26/19 0500)   pantoprozole (PROTONIX) infusion 8 mg/hr (09/26/19 0807)   piperacillin-tazobactam (ZOSYN)  IV 3.375 g (09/26/19 0546)   potassium chloride     thiamine injection 100 mL/hr at 09/25/19 1600   PRN Meds:.docusate sodium, fentaNYL (SUBLIMAZE) injection, LORazepam, ondansetron (ZOFRAN) IV, polyethylene glycol  Consults: Treatment Team:  Isaias Cowman, MD    Studies: DG Chest Port 1 View  Result Date: 09/25/2019 CLINICAL DATA:  Cardiac arrest.  NG tube placement. EXAM: PORTABLE CHEST 1 VIEW COMPARISON:  Chest radiograph 08/10/2019. FINDINGS: Tip and side port of the enteric tube below the diaphragm in the stomach. Cardiomegaly. There is pulmonary edema. Suspected small right left pleural effusion. No evidence of pneumothorax. Multiple overlying monitoring devices in place. IMPRESSION: 1. Tip and side port of the enteric tube below the diaphragm in the stomach. 2. Cardiomegaly with pulmonary edema and small pleural effusions. Electronically Signed   By: Keith Rake M.D.   On: 09/25/2019 21:03    SIGNIFICANT EVENTS 6/19: Admitted to ICU 6/19: Cardiac arrest due to polymorphic ventricular tachycardia with ROSC not requiring mechanical intubation 6/19: Possible aspiration into airway started on Zosyn  Subjective:    Overnight Issues: Patient suffered a primary cardiac arrest with possible  polymorphic Vtach/Vfib shocked with 200J, patient had return of spontaneous circulation awake and alert not requiring airway protection.  Objective:  Vital signs for last 24 hours: Temp:  [98 F (36.7 C)-99.2 F (37.3 C)] 98 F (36.7 C) (06/20 0400) Pulse Rate:  [62-88] 83 (06/20 0630) Resp:  [13-29] 29 (06/20 0630) BP: (82-116)/(51-87) 113/84 (06/20 0630) SpO2:  [93 %-100 %] 94 % (06/20 0630) Weight:  [74 kg] 74 kg (06/20 0500)   Intake/Output from previous day: 06/19 0701 - 06/20 0700 In: 4260.8 [I.V.:1447.9; IV Piggyback:2812.9] Out: 1194 [Urine:3075; Emesis/NG output:700]  Intake/Output this shift: No intake/output data recorded.    Physical Exam:  GENERAL: 59 year old patient lying in the bed with no acute distress.  EYES: Pupils equal, round, reactive to light and accommodation. No scleral icterus. Extraocular muscles intact.  HEENT: Head atraumatic, normocephalic. Oropharynx and nasopharynx clear. NG tube inplace NECK:  Supple, no jugular venous distention. No thyroid enlargement, no tenderness.  LUNGS: Normal breath sounds bilaterally, no wheezing, rales,rhonchi or crepitation. No use of accessory muscles of respiration.  CARDIOVASCULAR: S1, S2 normal. Murmurs present, rubs, or gallops.  ABDOMEN: Soft, nontender, nondistended. Bowel sounds present. No organomegaly or mass.  EXTREMITIES: No pedal edema, cyanosis, or clubbing.  NEUROLOGIC: Cranial nerves II through XII are intact. Muscle strength 5/5 in all extremities. Sensation intact. Gait not checked.  PSYCHIATRIC: The patient is alert and oriented x 3.  SKIN: No obvious rash, lesion, or ulcer.     Results for orders placed or  performed during the hospital encounter of 09/25/19 (from the past 24 hour(s))  Glucose, capillary     Status: Abnormal   Collection Time: 09/25/19 12:16 PM  Result Value Ref Range   Glucose-Capillary 152 (H) 70 - 99 mg/dL  HIV Antibody (routine testing w rflx)     Status: None    Collection Time: 09/25/19  5:46 PM  Result Value Ref Range   HIV Screen 4th Generation wRfx Non Reactive Non Reactive  Magnesium     Status: None   Collection Time: 09/25/19  5:46 PM  Result Value Ref Range   Magnesium 2.2 1.7 - 2.4 mg/dL  Cortisol     Status: None   Collection Time: 09/25/19  5:46 PM  Result Value Ref Range   Cortisol, Plasma 14.6 ug/dL  Phosphorus     Status: Abnormal   Collection Time: 09/25/19  5:46 PM  Result Value Ref Range   Phosphorus 1.2 (L) 2.5 - 4.6 mg/dL  Basic metabolic panel     Status: Abnormal   Collection Time: 09/25/19  5:46 PM  Result Value Ref Range   Sodium 133 (L) 135 - 145 mmol/L   Potassium 2.2 (LL) 3.5 - 5.1 mmol/L   Chloride 86 (L) 98 - 111 mmol/L   CO2 35 (H) 22 - 32 mmol/L   Glucose, Bld 230 (H) 70 - 99 mg/dL   BUN 8 6 - 20 mg/dL   Creatinine, Ser 1.17 0.61 - 1.24 mg/dL   Calcium 8.4 (L) 8.9 - 10.3 mg/dL   GFR calc non Af Amer >60 >60 mL/min   GFR calc Af Amer >60 >60 mL/min   Anion gap 12 5 - 15  Magnesium     Status: Abnormal   Collection Time: 09/25/19  8:54 PM  Result Value Ref Range   Magnesium 2.8 (H) 1.7 - 2.4 mg/dL  Phosphorus     Status: Abnormal   Collection Time: 09/25/19  8:54 PM  Result Value Ref Range   Phosphorus 1.1 (L) 2.5 - 4.6 mg/dL  Comprehensive metabolic panel     Status: Abnormal   Collection Time: 09/25/19  8:54 PM  Result Value Ref Range   Sodium 134 (L) 135 - 145 mmol/L   Potassium <2.0 (LL) 3.5 - 5.1 mmol/L   Chloride 90 (L) 98 - 111 mmol/L   CO2 33 (H) 22 - 32 mmol/L   Glucose, Bld 230 (H) 70 - 99 mg/dL   BUN 9 6 - 20 mg/dL   Creatinine, Ser 1.22 0.61 - 1.24 mg/dL   Calcium 8.1 (L) 8.9 - 10.3 mg/dL   Total Protein 5.9 (L) 6.5 - 8.1 g/dL   Albumin 2.6 (L) 3.5 - 5.0 g/dL   AST 174 (H) 15 - 41 U/L   ALT 51 (H) 0 - 44 U/L   Alkaline Phosphatase 36 (L) 38 - 126 U/L   Total Bilirubin 1.1 0.3 - 1.2 mg/dL   GFR calc non Af Amer >60 >60 mL/min   GFR calc Af Amer >60 >60 mL/min   Anion gap 11 5 - 15   CBC     Status: Abnormal   Collection Time: 09/25/19  8:54 PM  Result Value Ref Range   WBC 6.4 4.0 - 10.5 K/uL   RBC 2.94 (L) 4.22 - 5.81 MIL/uL   Hemoglobin 10.0 (L) 13.0 - 17.0 g/dL   HCT 28.6 (L) 39 - 52 %   MCV 97.3 80.0 - 100.0 fL   MCH 34.0 26.0 - 34.0 pg  MCHC 35.0 30.0 - 36.0 g/dL   RDW 11.8 11.5 - 15.5 %   Platelets 139 (L) 150 - 400 K/uL   nRBC 0.0 0.0 - 0.2 %  Troponin I (High Sensitivity)     Status: Abnormal   Collection Time: 09/25/19  8:54 PM  Result Value Ref Range   Troponin I (High Sensitivity) 111 (HH) <18 ng/L  Protime-INR     Status: None   Collection Time: 09/25/19  8:54 PM  Result Value Ref Range   Prothrombin Time 13.5 11.4 - 15.2 seconds   INR 1.1 0.8 - 1.2  APTT     Status: Abnormal   Collection Time: 09/25/19  8:54 PM  Result Value Ref Range   aPTT 37 (H) 24 - 36 seconds  Blood gas, arterial     Status: Abnormal   Collection Time: 09/25/19  8:57 PM  Result Value Ref Range   FIO2 1.00    pH, Arterial 7.48 (H) 7.35 - 7.45   pCO2 arterial 45 32 - 48 mmHg   pO2, Arterial 114 (H) 83 - 108 mmHg   Bicarbonate 33.5 (H) 20.0 - 28.0 mmol/L   Acid-Base Excess 9.0 (H) 0.0 - 2.0 mmol/L   O2 Saturation 98.7 %   Patient temperature 37.0    Collection site LEFT RADIAL    Sample type ARTERIAL DRAW    Allens test (pass/fail) PASS PASS  Troponin I (High Sensitivity)     Status: Abnormal   Collection Time: 09/25/19 11:01 PM  Result Value Ref Range   Troponin I (High Sensitivity) 148 (HH) <18 ng/L  Glucose, capillary     Status: Abnormal   Collection Time: 09/25/19 11:30 PM  Result Value Ref Range   Glucose-Capillary 225 (H) 70 - 99 mg/dL  Basic metabolic panel     Status: Abnormal   Collection Time: 09/26/19 12:50 AM  Result Value Ref Range   Sodium 133 (L) 135 - 145 mmol/L   Potassium 2.4 (LL) 3.5 - 5.1 mmol/L   Chloride 91 (L) 98 - 111 mmol/L   CO2 34 (H) 22 - 32 mmol/L   Glucose, Bld 254 (H) 70 - 99 mg/dL   BUN 7 6 - 20 mg/dL   Creatinine, Ser  1.13 0.61 - 1.24 mg/dL   Calcium 7.6 (L) 8.9 - 10.3 mg/dL   GFR calc non Af Amer >60 >60 mL/min   GFR calc Af Amer >60 >60 mL/min   Anion gap 8 5 - 15  Hemoglobin A1c     Status: None   Collection Time: 09/26/19 12:50 AM  Result Value Ref Range   Hgb A1c MFr Bld 5.1 4.8 - 5.6 %   Mean Plasma Glucose 99.67 mg/dL  CBC     Status: Abnormal   Collection Time: 09/26/19 12:50 AM  Result Value Ref Range   WBC 7.4 4.0 - 10.5 K/uL   RBC 2.73 (L) 4.22 - 5.81 MIL/uL   Hemoglobin 9.3 (L) 13.0 - 17.0 g/dL   HCT 27.1 (L) 39 - 52 %   MCV 99.3 80.0 - 100.0 fL   MCH 34.1 (H) 26.0 - 34.0 pg   MCHC 34.3 30.0 - 36.0 g/dL   RDW 11.9 11.5 - 15.5 %   Platelets 127 (L) 150 - 400 K/uL   nRBC 0.0 0.0 - 0.2 %  Glucose, capillary     Status: Abnormal   Collection Time: 09/26/19  4:23 AM  Result Value Ref Range   Glucose-Capillary 283 (H) 70 - 99 mg/dL  CBC     Status: Abnormal   Collection Time: 09/26/19  6:02 AM  Result Value Ref Range   WBC 9.3 4.0 - 10.5 K/uL   RBC 2.75 (L) 4.22 - 5.81 MIL/uL   Hemoglobin 9.4 (L) 13.0 - 17.0 g/dL   HCT 26.9 (L) 39 - 52 %   MCV 97.8 80.0 - 100.0 fL   MCH 34.2 (H) 26.0 - 34.0 pg   MCHC 34.9 30.0 - 36.0 g/dL   RDW 11.9 11.5 - 15.5 %   Platelets 143 (L) 150 - 400 K/uL   nRBC 0.0 0.0 - 0.2 %  Magnesium     Status: Abnormal   Collection Time: 09/26/19  6:02 AM  Result Value Ref Range   Magnesium 2.8 (H) 1.7 - 2.4 mg/dL  Phosphorus     Status: None   Collection Time: 09/26/19  6:02 AM  Result Value Ref Range   Phosphorus 3.1 2.5 - 4.6 mg/dL  Comprehensive metabolic panel     Status: Abnormal   Collection Time: 09/26/19  6:02 AM  Result Value Ref Range   Sodium 134 (L) 135 - 145 mmol/L   Potassium 2.8 (L) 3.5 - 5.1 mmol/L   Chloride 90 (L) 98 - 111 mmol/L   CO2 30 22 - 32 mmol/L   Glucose, Bld 251 (H) 70 - 99 mg/dL   BUN 8 6 - 20 mg/dL   Creatinine, Ser 1.17 0.61 - 1.24 mg/dL   Calcium 7.8 (L) 8.9 - 10.3 mg/dL   Total Protein 5.6 (L) 6.5 - 8.1 g/dL    Albumin 2.5 (L) 3.5 - 5.0 g/dL   AST 145 (H) 15 - 41 U/L   ALT 46 (H) 0 - 44 U/L   Alkaline Phosphatase 29 (L) 38 - 126 U/L   Total Bilirubin 1.3 (H) 0.3 - 1.2 mg/dL   GFR calc non Af Amer >60 >60 mL/min   GFR calc Af Amer >60 >60 mL/min   Anion gap 14 5 - 15     ASSESSMENT AND PLAN   Cardiac arrest secondary to Polymorphic Vtach/VFib in the setting of electrolytes derangements. - Successful ROSC not requiring airway management - Amiodarone switched to Lidocaine gtt due to prolonged QTc causing above - Replace electrolytes - Monitor with serial EKGs - Avoid QTc prolongation medications - Cardiology following  Severe electrolytes abnormalities secondary to ETOH abuse Hypokalemia + Hypomagnesium+Hypophosphotemia -Follow serial BMP -Aggressive electrolytes replacement  ETOH abuse - High risk for withdrawal - Monitor CMP, INR, Daily BMP+Mg - Daily Thiamine, Folate, MVI once tolerating PO - Seizure precautions - SW consult for cessation resources - PT/OT evaluation for mobility  Possible Aspiration Pneumonia -Check procalcitonin -Continue Zosyn  GI Bleed - 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  Transfuse PRN Hgb<8 - Pantoprazole 40mg  IV  - NG to intermittent wall suction - NPO for now - Helicobacter pylori Ab + stool Ag.   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 -Cardiology following, appreciate input -Repeat 2D Echocardiogram pending      Best Practice/Protocols: VTE OQ:HUTM Lovenox SUP px:IV Protonix Diet:Regular Diet   Rufina Falco. DNP, CCRN, FNP-BC [place]

## 2019-09-26 NOTE — Progress Notes (Signed)
Discussed with H[place] Dyer, NP that 1 mg of ativan was given IV for withdrawal symptoms and that patient rested for maybe 5 minutes and is now increasingly agitated trying to get out of bed and pulling at tubing. NP stated that she would order precedex drip.

## 2019-09-26 NOTE — Progress Notes (Signed)
Attempted bipap wean to HFNC. Patient stated he was breathing much better and felt like he was ready to come off. Unable to tolerate off but appx 2 mins and placed patient back on bipap.

## 2019-09-26 NOTE — Progress Notes (Signed)
[facility] Cardiology  SUBJECTIVE: Alexander Welch laying in bed, with BiPAP, denies chest pain   Vitals:   09/26/19 1215 09/26/19 1220 09/26/19 1225 09/26/19 1230  BP: 107/88 105/86 91/81 103/88  Pulse: (!) 117 (!) 118 (!) 116 (!) 115  Resp: (!) 34 (!) 38 (!) 33 (!) 21  Temp:      TempSrc:      SpO2: 92% 90% 100% 100%  Weight:      Height:         Intake/Output Summary (Last 24 hours) at 09/26/2019 1319 Last data filed at 09/26/2019 3532 Gross per 24 hour  Intake 2660.8 ml  Output 3775 ml  Net -1114.2 ml      PHYSICAL EXAM  General: Well developed, well nourished, in no acute distress HEENT:  Normocephalic and atramatic Neck:  No JVD.  Lungs: Clear bilaterally to auscultation and percussion. Heart: HRRR . Normal S1 and S2 without gallops or murmurs.  Abdomen: Bowel sounds are positive, abdomen soft and non-tender  Msk:  Back normal, normal gait. Normal strength and tone for age. Extremities: No clubbing, cyanosis or edema.   Neuro: Alert and oriented X 3. Psych:  Good affect, responds appropriately   LABS: Basic Metabolic Panel: Recent Labs    09/25/19 2054 09/25/19 2054 09/26/19 0050 09/26/19 0602  NA 134*   < > 133* 134*  K <2.0*   < > 2.4* 2.8*  CL 90*   < > 91* 90*  CO2 33*   < > 34* 30  GLUCOSE 230*   < > 254* 251*  BUN 9   < > 7 8  CREATININE 1.22   < > 1.13 1.17  CALCIUM 8.1*   < > 7.6* 7.8*  MG 2.8*  --   --  2.8*  PHOS 1.1*  --   --  3.1   < > = values in this interval not displayed.   Liver Function Tests: Recent Labs    09/25/19 2054 09/26/19 0602  AST 174* 145*  ALT 51* 46*  ALKPHOS 36* 29*  BILITOT 1.1 1.3*  PROT 5.9* 5.6*  ALBUMIN 2.6* 2.5*   Recent Labs    09/25/19 0608  LIPASE 84*   CBC: Recent Labs    09/26/19 0050 09/26/19 0602  WBC 7.4 9.3  HGB 9.3* 9.4*  HCT 27.1* 26.9*  MCV 99.3 97.8  PLT 127* 143*   Cardiac Enzymes: No results for input(s): CKTOTAL, CKMB, CKMBINDEX, TROPONINI in the last 72 hours. BNP: Invalid input(s):  POCBNP D-Dimer: No results for input(s): DDIMER in the last 72 hours. Hemoglobin A1C: Recent Labs    09/26/19 0050  HGBA1C 5.1   Fasting Lipid Panel: No results for input(s): CHOL, HDL, LDLCALC, TRIG, CHOLHDL, LDLDIRECT in the last 72 hours. Thyroid Function Tests: Recent Labs    09/25/19 0643  TSH 0.860   Anemia Panel: No results for input(s): VITAMINB12, FOLATE, FERRITIN, TIBC, IRON, RETICCTPCT in the last 72 hours.  DG Chest Port 1 View  Result Date: 09/26/2019 CLINICAL DATA:  Acute respiratory failure EXAM: PORTABLE CHEST 1 VIEW COMPARISON:  September 25, 2019 FINDINGS: The NG tube terminates below today's film. The cardiomediastinal silhouette is stable. Increased interstitial markings bilaterally are similar to mildly improved in the interval. Transcutaneous pacer leads are noted. No other acute abnormalities are identified. IMPRESSION: 1. Cardiomegaly and pulmonary edema persists, similar in the interval. 2. Support apparatus as above. Electronically Signed   By: Dorise Bullion III M.D   On: 09/26/2019 11:28  DG Chest Port 1 View  Result Date: 09/25/2019 CLINICAL DATA:  Cardiac arrest.  NG tube placement. EXAM: PORTABLE CHEST 1 VIEW COMPARISON:  Chest radiograph 08/10/2019. FINDINGS: Tip and side port of the enteric tube below the diaphragm in the stomach. Cardiomegaly. There is pulmonary edema. Suspected small right left pleural effusion. No evidence of pneumothorax. Multiple overlying monitoring devices in place. IMPRESSION: 1. Tip and side port of the enteric tube below the diaphragm in the stomach. 2. Cardiomegaly with pulmonary edema and small pleural effusions. Electronically Signed   By: Keith Rake M.D.   On: 09/25/2019 21:03   ECHOCARDIOGRAM COMPLETE  Result Date: 09/26/2019    ECHOCARDIOGRAM REPORT   Patient Name:   Alexander Welch Date of Exam: 09/26/2019 Medical Rec #:  [id number]        Height:       67.0 in Accession #:    [id number]       Weight:       163.1 lb  Date of Birth:  31-Jul-1960        BSA:          1.855 m Patient Age:    59 years         BP:           108/87 mmHg Patient Gender: M                HR:           74 bpm. Exam Location:  [facility] Procedure: 2D Echo and Intracardiac Opacification Agent Indications:     Cardiomyopathy  History:         Patient has no prior history of Echocardiogram examinations.                  CHF and Ischemic CM, Arrythmias:Atrial Fibrillation; Risk                  Factors:Hypertension, Dyslipidemia and Current Smoker.  Sonographer:     Carlean Jews Thornton-Maynard Referring Phys:  [id number] CARMEN L GONZALEZ Diagnosing Phys: Isaias Cowman MD IMPRESSIONS  1. Left ventricular ejection fraction, by estimation, is 20 to 25%. The left ventricle has severely decreased function. The left ventricle has no regional wall motion abnormalities. The left ventricular internal cavity size was moderately dilated. Left ventricular diastolic parameters were normal.  2. Right ventricular systolic function is normal. The right ventricular size is normal. There is moderately elevated pulmonary artery systolic pressure.  3. The mitral valve is normal in structure. Moderate to severe mitral valve regurgitation. No evidence of mitral stenosis.  4. Tricuspid valve regurgitation is moderate to severe.  5. The aortic valve is normal in structure. Aortic valve regurgitation is mild to moderate. No aortic stenosis is present.  6. The inferior vena cava is normal in size with greater than 50% respiratory variability, suggesting right atrial pressure of 3 mmHg. FINDINGS  Left Ventricle: Left ventricular ejection fraction, by estimation, is 20 to 25%. The left ventricle has severely decreased function. The left ventricle has no regional wall motion abnormalities. Definity contrast agent was given IV to delineate the left  ventricular endocardial borders. The left ventricular internal cavity size was moderately dilated. There is no left ventricular hypertrophy. Left  ventricular diastolic parameters were normal. Right Ventricle: The right ventricular size is normal. No increase in right ventricular wall thickness. Right ventricular systolic function is normal. There is moderately elevated pulmonary artery systolic pressure. The tricuspid regurgitant velocity is 3.07 m/s, and with an  assumed right atrial pressure of 10 mmHg, the estimated right ventricular systolic pressure is 11.9 mmHg. Left Atrium: Left atrial size was normal in size. Right Atrium: Right atrial size was normal in size. Pericardium: There is no evidence of pericardial effusion. Mitral Valve: The mitral valve is normal in structure. Normal mobility of the mitral valve leaflets. Moderate to severe mitral valve regurgitation. No evidence of mitral valve stenosis. Tricuspid Valve: The tricuspid valve is normal in structure. Tricuspid valve regurgitation is moderate to severe. No evidence of tricuspid stenosis. Aortic Valve: The aortic valve is normal in structure. Aortic valve regurgitation is mild to moderate. Aortic regurgitation PHT measures 454 msec. No aortic stenosis is present. Aortic valve mean gradient measures 4.0 mmHg. Aortic valve peak gradient measures 8.5 mmHg. Aortic valve area, by VTI measures 2.28 cm. Pulmonic Valve: The pulmonic valve was normal in structure. Pulmonic valve regurgitation is not visualized. No evidence of pulmonic stenosis. Aorta: The aortic root is normal in size and structure. Venous: The inferior vena cava is normal in size with greater than 50% respiratory variability, suggesting right atrial pressure of 3 mmHg. IAS/Shunts: No atrial level shunt detected by color flow Doppler.  LEFT VENTRICLE PLAX 2D LVIDd:         6.72 cm  Diastology LVIDs:         6.04 cm  LV e' lateral:   3.37 cm/s LV PW:         1.11 cm  LV E/e' lateral: 18.3 LV IVS:        0.80 cm  LV e' medial:    5.38 cm/s LVOT diam:     2.70 cm  LV E/e' medial:  11.5 LV SV:         48 LV SV Index:   26 LVOT Area:      5.73 cm  RIGHT VENTRICLE RV S prime:     12.10 cm/s TAPSE (M-mode): 2.4 cm LEFT ATRIUM             Index LA diam:        3.40 cm 1.83 cm/m LA Vol (A2C):   45.8 ml 24.70 ml/m LA Vol (A4C):   51.9 ml 27.99 ml/m LA Biplane Vol: 51.8 ml 27.93 ml/m  AORTIC VALVE AV Area (Vmax):    1.76 cm AV Area (Vmean):   1.85 cm AV Area (VTI):     2.28 cm AV Vmax:           146.00 cm/s AV Vmean:          97.700 cm/s AV VTI:            0.212 m AV Peak Grad:      8.5 mmHg AV Mean Grad:      4.0 mmHg LVOT Vmax:         45.00 cm/s LVOT Vmean:        31.600 cm/s LVOT VTI:          0.084 m LVOT/AV VTI ratio: 0.40 AI PHT:            454 msec  AORTA Ao Root diam: 3.70 cm MV E velocity: 61.70 cm/s  TRICUSPID VALVE MV A velocity: 45.40 cm/s  TR Peak grad:   37.7 mmHg MV E/A ratio:  1.36        TR Vmax:        307.00 cm/s  SHUNTS                            Systemic VTI:  0.08 m                            Systemic Diam: 2.70 cm Isaias Cowman MD Electronically signed by Isaias Cowman MD Signature Date/Time: 09/26/2019/10:56:22 AM    Final      Echo LVEF 20 to 25% with dilated cardiomyopathy  TELEMETRY: Sinus tachycardia:  ASSESSMENT AND PLAN:  Active Problems:   Alcohol abuse   Ventricular tachyarrhythmia (HCC)   Acute respiratory failure (HCC)   Atrial fibrillation with RVR ([place])    1.  Polymorphic VT, occurred while on amiodarone, switched to lidocaine infusion, secondary to QT prolongation and severe electrolyte abnormalities, currently in sinus rhythm 2.  Paroxysmal atrial fibrillation with rapid ventricular rate, in setting of electrolyte abnormalities, converted to sinus rhythm on amiodarone infusion, remains in sinus rhythm 3.  Severe nonischemic cardiomyopathy, LVEF 20 to 25% 4.  Chronic systolic congestive heart failure, with evidence of fluid retention and pulmonary edema on physical examination 5.  Hypokalemia, in the setting of chronic EtOH abuse 6.  Hypomagnesemia 7.   EtOH abuse  Recommendations  1.  Agree with current therapy 2.  Continue lidocaine drip until electrolyte abnormalities corrected 3.  Continue to replete potassium 4.  Defer anticoagulation for atrial fibrillation 5.  Continue diuresis 6.  Agree with trial of IV ethacrynic acid to avoid further potassium depletion 7.  Resume carvedilol, Entresto and spironolactone when patient clinically stabilizes   Isaias Cowman, MD, PhD, [facility] 09/26/2019 1:19 PM

## 2019-09-26 NOTE — Progress Notes (Signed)
Assisted tele visit to patient with family member.  Hildreth Robart M, RN  

## 2019-09-26 NOTE — Progress Notes (Signed)
Pt with audible course lung sounds, appears diaphoretic, adjusted SpO2 for better reading, O2 sat in the 70s.  Placed pt on 15L NRB, charge nurse at bedside, RT and MD notified, O2 sat increased to 90%.  Received order from MD for diuretics and BiPAP.  Placed on BiPAP FiO2 @ 100%, O2 sat increased to 100%, breathing remains labored and tachypneic.  Will continue to monitor.

## 2019-09-26 NOTE — Procedures (Signed)
Central Venous Catheter Insertion Procedure Note Alexander Welch [id number] 02-27-1961  Procedure: Insertion of Central Venous Catheter Indications: Assessment of intravascular volume, Drug and/or fluid administration and Frequent blood sampling  Procedure Details Consent: Risks of procedure as well as the alternatives and risks of each were explained to the (patient/caregiver).  Consent for procedure obtained. Time Out: Verified patient identification, verified procedure, site/side was marked, verified correct patient position, special equipment/implants available, medications/allergies/relevent history reviewed, required imaging and test results available.  Timeout performed  Maximum sterile technique was used including antiseptics, cap, gloves, gown, hand hygiene, mask and sheet. Skin prep: Chlorhexidine; local anesthetic administered A antimicrobial bonded/coated triple lumen catheter was placed in the right femoral vein due to emergent situation using the Seldinger technique.  Evaluation Blood flow good Complications: No apparent complications Patient did tolerate procedure well. Chest X-ray ordered to verify placement.  CXR: normal.  Procedure performed under direct supervision of  Dr. Patsey Berthold. Ultrasound utilized for realtime vessel cannulation  Alexander Welch S. [facility] ANP-BC Pulmonary and Critical Care Medicine [facility] Pager [phone] or [phone]  NB: This document was prepared using Dragon voice recognition software and may include unintentional dictation errors.    Alexander Welch 09/26/2019, 5:37 AM

## 2019-09-26 NOTE — Progress Notes (Signed)
Assisted tele visit to patient with family member.  Ercie Eliasen M, RN  

## 2019-09-27 ENCOUNTER — Inpatient Hospital Stay: Payer: BC Managed Care – PPO

## 2019-09-27 LAB — CBC WITH DIFFERENTIAL/PLATELET
Abs Immature Granulocytes: 0.06 10*3/uL (ref 0.00–0.07)
Basophils Absolute: 0 10*3/uL (ref 0.0–0.1)
Basophils Relative: 0 %
Eosinophils Absolute: 0 10*3/uL (ref 0.0–0.5)
Eosinophils Relative: 0 %
HCT: 25.9 % — ABNORMAL LOW (ref 39.0–52.0)
Hemoglobin: 9.4 g/dL — ABNORMAL LOW (ref 13.0–17.0)
Immature Granulocytes: 1 %
Lymphocytes Relative: 9 %
Lymphs Abs: 0.8 10*3/uL (ref 0.7–4.0)
MCH: 34.8 pg — ABNORMAL HIGH (ref 26.0–34.0)
MCHC: 36.3 g/dL — ABNORMAL HIGH (ref 30.0–36.0)
MCV: 95.9 fL (ref 80.0–100.0)
Monocytes Absolute: 0.7 10*3/uL (ref 0.1–1.0)
Monocytes Relative: 8 %
Neutro Abs: 7.8 10*3/uL — ABNORMAL HIGH (ref 1.7–7.7)
Neutrophils Relative %: 82 %
Platelets: 135 10*3/uL — ABNORMAL LOW (ref 150–400)
RBC: 2.7 MIL/uL — ABNORMAL LOW (ref 4.22–5.81)
RDW: 12.3 % (ref 11.5–15.5)
WBC: 9.4 10*3/uL (ref 4.0–10.5)
nRBC: 0 % (ref 0.0–0.2)

## 2019-09-27 LAB — POTASSIUM: Potassium: 5.1 mmol/L (ref 3.5–5.1)

## 2019-09-27 LAB — BLOOD GAS, ARTERIAL
Acid-base deficit: 1.8 mmol/L (ref 0.0–2.0)
Bicarbonate: 20.9 mmol/L (ref 20.0–28.0)
Delivery systems: POSITIVE
Expiratory PAP: 8
FIO2: 0.6
Inspiratory PAP: 16
O2 Saturation: 97.6 %
Patient temperature: 37
pCO2 arterial: 28 mmHg — ABNORMAL LOW (ref 32.0–48.0)
pH, Arterial: 7.48 — ABNORMAL HIGH (ref 7.350–7.450)
pO2, Arterial: 91 mmHg (ref 83.0–108.0)

## 2019-09-27 LAB — MAGNESIUM: Magnesium: 2.3 mg/dL (ref 1.7–2.4)

## 2019-09-27 LAB — BASIC METABOLIC PANEL
Anion gap: 16 — ABNORMAL HIGH (ref 5–15)
BUN: 12 mg/dL (ref 6–20)
CO2: 26 mmol/L (ref 22–32)
Calcium: 7.3 mg/dL — ABNORMAL LOW (ref 8.9–10.3)
Chloride: 93 mmol/L — ABNORMAL LOW (ref 98–111)
Creatinine, Ser: 1.56 mg/dL — ABNORMAL HIGH (ref 0.61–1.24)
GFR calc Af Amer: 55 mL/min — ABNORMAL LOW (ref >60)
GFR calc non Af Amer: 48 mL/min — ABNORMAL LOW (ref >60)
Glucose, Bld: 169 mg/dL — ABNORMAL HIGH (ref 70–99)
Potassium: 3.3 mmol/L — ABNORMAL LOW (ref 3.5–5.1)
Sodium: 135 mmol/L (ref 135–145)

## 2019-09-27 LAB — GLUCOSE, CAPILLARY
Glucose-Capillary: 141 mg/dL — ABNORMAL HIGH (ref 70–99)
Glucose-Capillary: 150 mg/dL — ABNORMAL HIGH (ref 70–99)
Glucose-Capillary: 134 mg/dL — ABNORMAL HIGH (ref 70–99)
Glucose-Capillary: 86 mg/dL (ref 70–99)
Glucose-Capillary: 64 mg/dL — ABNORMAL LOW (ref 70–99)
Glucose-Capillary: 147 mg/dL — ABNORMAL HIGH (ref 70–99)
Glucose-Capillary: 107 mg/dL — ABNORMAL HIGH (ref 70–99)

## 2019-09-27 LAB — PHOSPHORUS: Phosphorus: 4.7 mg/dL — ABNORMAL HIGH (ref 2.5–4.6)

## 2019-09-27 MED ORDER — DEXTROSE 50 % IV SOLN
1.0000 | Freq: Once | INTRAVENOUS | Status: AC
  Administered 2019-09-27: 50 mL via INTRAVENOUS

## 2019-09-27 MED ORDER — SODIUM CHLORIDE 0.9 % IV SOLN
3.0000 g | Freq: Four times a day (QID) | INTRAVENOUS | Status: DC
  Administered 2019-09-27 – 2019-09-30 (×10): 3 g via INTRAVENOUS
  Filled 2019-09-27: qty 8
  Filled 2019-09-27: qty 3
  Filled 2019-09-27 (×2): qty 8
  Filled 2019-09-27 (×4): qty 3
  Filled 2019-09-27: qty 8
  Filled 2019-09-27 (×2): qty 3
  Filled 2019-09-27: qty 8
  Filled 2019-09-27 (×3): qty 3

## 2019-09-27 MED ORDER — POTASSIUM CHLORIDE 10 MEQ/50ML IV SOLN
10.0000 meq | INTRAVENOUS | Status: AC
  Administered 2019-09-27 (×6): 10 meq via INTRAVENOUS
  Filled 2019-09-27 (×6): qty 50

## 2019-09-27 MED ORDER — ALBUMIN HUMAN 25 % IV SOLN
12.5000 g | Freq: Once | INTRAVENOUS | Status: AC
  Administered 2019-09-27: 12.5 g via INTRAVENOUS
  Filled 2019-09-27: qty 50

## 2019-09-27 MED ORDER — DEXTROSE 50 % IV SOLN
INTRAVENOUS | Status: AC
  Filled 2019-09-27: qty 50

## 2019-09-27 NOTE — Progress Notes (Signed)
Patient rested well overnight after precedex drip was started. Arouses to voice. Tolerating BiPAP although intermittently has apnic episodes while on BiPAP. Remains on levophed drip which had to be titrated up after precedex was started. 6 runs of K+ given overnight and this morning K+ 3.3 and now receiving 6 more runs of K+ per order.

## 2019-09-27 NOTE — Progress Notes (Signed)
Patients had increased WOB while off of bipap and sats dropped pretty quickly to the upper 70's. Bipap replaced with patients saturations returning to the mid 90's. RN notified of change.

## 2019-09-27 NOTE — Progress Notes (Signed)
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 Noted to be hypotensive tachycardic but lucid and AAOx4 per H/P. He had electrolyte derrangments with devt of AFrVR. While in MICU he went into VF received 200J defib with ROSC and was able to speak immediately after and was subsequently in sinus rythm.   Lines / Drains: R-fem-TLC PIV  Cultures / Sepsis markers: -Blood culture x2 -will send today  -Procal with mild elevation - 0.3   Antibiotics: Zosyn>>unasy for empiric aspiration    Protocols / Consultants: Cardio, pccm  Tests / Events: TTE   PAST MEDICAL HISTORY   Past Medical History:  Diagnosis Date  . Abnormal CT scan, colon 11/27/2017  . Alcohol abuse   . Cardiomyopathy ([place])   . CHF (congestive heart failure) ([place])   . Colon polyps   . ED (erectile dysfunction)   . Hypertension   . Hypertriglyceridemia   . Osteoarthritis   . Osteoarthritis      SURGICAL HISTORY   Past Surgical History:  Procedure Laterality Date  . COLONOSCOPY    . COLONOSCOPY WITH PROPOFOL N/A 02/02/2018   Procedure: COLONOSCOPY WITH PROPOFOL;  Surgeon: Manya Silvas, MD;  Location[facility] ENDOSCOPY;  Service: Endoscopy;  Laterality: N/A;  . LOWER EXTREMITY ANGIOGRAPHY Right 06/21/2019   Procedure: LOWER EXTREMITY ANGIOGRAPHY;  Surgeon: Algernon Huxley, MD;  Location: [place] CV LAB;  Service: Cardiovascular;  Laterality: Right;     FAMILY HISTORY   Family History  Problem Relation Age of Onset  .  Heart failure Mother   . Renal Disease Mother   . Bone cancer Father      SOCIAL HISTORY   Social History   Tobacco Use  . Smoking status: Current Every Day Smoker    Packs/day: 1.00    Years: 40.00    Pack years: 40.00    Types: Cigarettes  . Smokeless tobacco: Never Used  Vaping Use  . Vaping Use: Never used  Substance Use Topics  . Alcohol use: Yes    Comment: 1/4 pint of vodka daily  . Drug use: Yes    Types: Marijuana     MEDICATIONS   Current Medication:  Current Facility-Administered Medications:  .  0.9 %  sodium chloride infusion, 250 mL, Intravenous, Continuous, Hinda Kehr, MD, Last Rate: 10 mL/hr at 09/25/19 1600, Rate Verify at 09/25/19 1600 .  0.9 %  sodium chloride infusion, 250 mL, Intravenous, Continuous, Tyler Pita, MD, Stopped at 09/25/19 1211 .  Chlorhexidine Gluconate Cloth 2 % PADS 6 each, 6 each, Topical, Q0600, Tyler Pita, MD, 6 each at 09/27/19 [phone] .  dexmedetomidine (PRECEDEX) 400 MCG/100ML (4 mcg/mL) infusion, 0.4-1.2 mcg/kg/hr, Intravenous, Titrated, Blakeney, Dana G, NP, Last Rate: 7.4 mL/hr at 09/27/19 0700, 0.4 mcg/kg/hr at 09/27/19 0700 .  docusate sodium (COLACE) capsule 100 mg, 100 mg, Oral, BID PRN, Tyler Pita, MD .  enoxaparin (LOVENOX) injection 40 mg, 40 mg, Subcutaneous, Q24H, Tyler Pita, MD, 40 mg at 09/26/19 5621 .  fentaNYL (SUBLIMAZE) injection 12.5 mcg, 12.5 mcg, Intravenous, Q3H PRN, Tukov-Yual, Magdalene S, NP, 12.5 mcg at 09/26/19 0016 .  insulin aspart (novoLOG) injection  0-6 Units, 0-6 Units, Subcutaneous, Q4H, Tukov-Yual, Magdalene S, NP, 2 Units at 09/26/19 0809 .  [COMPLETED] lidocaine (XYLOCAINE) 4 mg/mL bolus via infusion 75 mg, 75 mg, Intravenous, Once, 75 mg at 09/25/19 2156 **AND** lidocaine (cardiac) 2000 mg in dextrose 5% 500 mL (4mg /mL) IV infusion, 2 mg/min, Intravenous, Continuous, Shanlever, Pierce Crane, [facility], Last Rate: 30 mL/hr at 09/27/19 0802, 2 mg/min at 09/27/19 0802 .   LORazepam (ATIVAN) injection 1-2 mg, 1-2 mg, Intravenous, Q1H PRN, Tyler Pita, MD, 1 mg at 09/26/19 2018 .  MEDLINE mouth rinse, 15 mL, Mouth Rinse, BID, Tyler Pita, MD, 15 mL at 09/26/19 2126 .  nicotine (NICODERM CQ - dosed in mg/24 hours) patch 21 mg, 21 mg, Transdermal, Daily, Tyler Pita, MD, 21 mg at 09/26/19 0814 .  norepinephrine (LEVOPHED) 16 mg in 265mL premix infusion, 0-40 mcg/min, Intravenous, Titrated, Tukov-Yual, Magdalene S, NP, Last Rate: 22.5 mL/hr at 09/27/19 0758, 24 mcg/min at 09/27/19 0758 .  ondansetron (ZOFRAN) injection 4 mg, 4 mg, Intravenous, Q6H PRN, Tyler Pita, MD .  pantoprazole (PROTONIX) 80 mg in sodium chloride 0.9 % 100 mL (0.8 mg/mL) infusion, 8 mg/hr, Intravenous, Continuous, Tukov-Yual, Magdalene S, NP, Last Rate: 10 mL/hr at 09/27/19 0700, 8 mg/hr at 09/27/19 0700 .  [START ON 09/29/2019] pantoprazole (PROTONIX) injection 40 mg, 40 mg, Intravenous, Q12H, Tukov-Yual, Magdalene S, NP .  piperacillin-tazobactam (ZOSYN) IVPB 3.375 g, 3.375 g, Intravenous, Q8H, Tukov-Yual, Magdalene S, NP, Last Rate: 12.5 mL/hr at 09/27/19 0700, Rate Verify at 09/27/19 0700 .  pneumococcal 23 valent vaccine (PNEUMOVAX-23) injection 0.5 mL, 0.5 mL, Intramuscular, Tomorrow-1000, Vernard Gambles L, MD .  polyethylene glycol (MIRALAX / GLYCOLAX) packet 17 g, 17 g, Oral, Daily PRN, Vernard Gambles L, MD .  potassium chloride 10 mEq in 50 mL *CENTRAL LINE* IVPB, 10 mEq, Intravenous, Q1 Hr x 6, Blakeney, Dana G, NP, Last Rate: 50 mL/hr at 09/27/19 0800, 10 mEq at 09/27/19 0800 .  thiamine 500mg  in normal saline (18ml) IVPB, 500 mg, Intravenous, Daily, Tyler Pita, MD, Stopped at 09/26/19 1147    ALLERGIES   Patient has no known allergies.    REVIEW OF SYSTEMS    Patient confused on sedation while in alcohol withdrawal  PHYSICAL EXAMINATION   Vital Signs: Temp:  [98.9 F (37.2 C)-99.7 F (37.6 C)] 99.7 F (37.6 C) (06/21 0400) Pulse  Rate:  [78-118] 78 (06/21 0700) Resp:  [14-40] 35 (06/21 0700) BP: (65-120)/(56-93) 93/74 (06/21 0700) SpO2:  [72 %-100 %] 100 % (06/21 0700) FiO2 (%):  [70 %-100 %] 70 % (06/21 0700) Weight:  [77.7 kg] 77.7 kg (06/21 0328)  GENERAL:NAD, patient is grabbing at empty space in front of him with slow speech HEAD: Normocephalic, atraumatic.  EYES: Pupils equal, round, reactive to light.  No scleral icterus.  MOUTH: Moist mucosal membrane. NECK: Supple. No thyromegaly. No nodules. No JVD.  PULMONARY: bilateral inspiratory and expiratory crackles CARDIOVASCULAR: S1 and S2. Regular rate and rhythm. No murmurs, rubs, or gallops.  GASTROINTESTINAL: Soft, nontender, non-distended. No masses. Positive bowel sounds. No hepatosplenomegaly.  MUSCULOSKELETAL: No swelling, clubbing, or edema.  NEUROLOGIC:GCS10 on sedation SKIN:intact,warm,dry   PERTINENT DATA     Infusions: . sodium chloride 10 mL/hr at 09/25/19 1600  . sodium chloride Stopped (09/25/19 1211)  . dexmedetomidine (PRECEDEX) IV infusion 0.4 mcg/kg/hr (09/27/19 0700)  . lidocaine 2 mg/min (09/27/19 0802)  . norepinephrine (LEVOPHED) Adult infusion 24 mcg/min (09/27/19 0758)  . pantoprozole (PROTONIX) infusion 8 mg/hr (09/27/19 0700)  .  piperacillin-tazobactam (ZOSYN)  IV 12.5 mL/hr at 09/27/19 0700  . potassium chloride 10 mEq (09/27/19 0800)  . thiamine injection Stopped (09/26/19 1147)   Scheduled Medications: . Chlorhexidine Gluconate Cloth  6 each Topical Q0600  . enoxaparin (LOVENOX) injection  40 mg Subcutaneous Q24H  . insulin aspart  0-6 Units Subcutaneous Q4H  . mouth rinse  15 mL Mouth Rinse BID  . nicotine  21 mg Transdermal Daily  . [START ON 09/29/2019] pantoprazole  40 mg Intravenous Q12H  . pneumococcal 23 valent vaccine  0.5 mL Intramuscular Tomorrow-1000   PRN Medications: docusate sodium, fentaNYL (SUBLIMAZE) injection, LORazepam, ondansetron (ZOFRAN) IV, polyethylene glycol Hemodynamic parameters:    Intake/Output: 06/20 0701 - 06/21 0700 In: 2804.8 [I.V.:1492.9; IV Piggyback:1311.9] Out: 1325 [Urine:975; Emesis/NG output:350]  Ventilator  Settings: FiO2 (%):  [70 %-100 %] 70 %   LAB RESULTS:  Basic Metabolic Panel: Recent Labs  Lab 09/25/19 0608 09/25/19 0612 09/25/19 0643 09/25/19 1746 09/25/19 1746 09/25/19 2054 09/25/19 2054 09/26/19 0050 09/26/19 0050 09/26/19 0602 09/26/19 0602 09/26/19 1757 09/27/19 0427  NA   < >  --   --  133*   < > 134*  --  133*  --  134*  --  136 135  K   < >  --  <2.0* 2.2*   < > <2.0*   < > 2.4*   < > 2.8*   < > 2.7* 3.3*  CL   < >  --   --  86*   < > 90*  --  91*  --  90*  --  92* 93*  CO2   < >  --   --  35*   < > 33*  --  34*  --  30  --  33* 26  GLUCOSE   < >  --   --  230*   < > 230*  --  254*  --  251*  --  129* 169*  BUN   < >  --   --  8   < > 9  --  7  --  8  --  9 12  CREATININE   < >  --   --  1.17   < > 1.22  --  1.13  --  1.17  --  1.25* 1.56*  CALCIUM   < >  --   --  8.4*   < > 8.1*  --  7.6*  --  7.8*  --  7.5* 7.3*  MG  --  1.3*  --  2.2  --  2.8*  --   --   --  2.8*  --   --  2.3  PHOS  --   --  2.7 1.2*  --  1.1*  --   --   --  3.1  --   --  4.7*   < > = values in this interval not displayed.   Liver Function Tests: Recent Labs  Lab 09/25/19 0608 09/25/19 2054 09/26/19 0602  AST 81* 174* 145*  ALT 34 51* 46*  ALKPHOS 35* 36* 29*  BILITOT 1.7* 1.1 1.3*  PROT 6.7 5.9* 5.6*  ALBUMIN 3.2* 2.6* 2.5*   Recent Labs  Lab 09/25/19 0608  LIPASE 84*   No results for input(s): AMMONIA in the last 168 hours. CBC: Recent Labs  Lab 09/25/19 0608 09/25/19 2054 09/26/19 0050 09/26/19 0602 09/27/19 0427  WBC 8.7 6.4 7.4 9.3 9.4  NEUTROABS  --   --   --   --  7.8*  HGB 11.8* 10.0* 9.3* 9.4* 9.4*  HCT 32.5* 28.6* 27.1* 26.9* 25.9*  MCV 95.9 97.3 99.3 97.8 95.9  PLT 153 139* 127* 143* 135*   Cardiac Enzymes: No results for input(s): CKTOTAL, CKMB, CKMBINDEX, TROPONINI in the last 168 hours. BNP: Invalid  input(s): POCBNP CBG: Recent Labs  Lab 09/26/19 1538 09/26/19 1956 09/26/19 2321 09/27/19 0324 09/27/19 0732  GLUCAP 120* 102* 131* 141* 150*       IMAGING RESULTS:  Imaging: DG Chest Port 1 View  Result Date: 09/26/2019 CLINICAL DATA:  Acute respiratory failure EXAM: PORTABLE CHEST 1 VIEW COMPARISON:  September 25, 2019 FINDINGS: The NG tube terminates below today's film. The cardiomediastinal silhouette is stable. Increased interstitial markings bilaterally are similar to mildly improved in the interval. Transcutaneous pacer leads are noted. No other acute abnormalities are identified. IMPRESSION: 1. Cardiomegaly and pulmonary edema persists, similar in the interval. 2. Support apparatus as above. Electronically Signed   By: Dorise Bullion III M.D   On: 09/26/2019 11:28   DG Chest Port 1 View  Result Date: 09/25/2019 CLINICAL DATA:  Cardiac arrest.  NG tube placement. EXAM: PORTABLE CHEST 1 VIEW COMPARISON:  Chest radiograph 08/10/2019. FINDINGS: Tip and side port of the enteric tube below the diaphragm in the stomach. Cardiomegaly. There is pulmonary edema. Suspected small right left pleural effusion. No evidence of pneumothorax. Multiple overlying monitoring devices in place. IMPRESSION: 1. Tip and side port of the enteric tube below the diaphragm in the stomach. 2. Cardiomegaly with pulmonary edema and small pleural effusions. Electronically Signed   By: Keith Rake M.D.   On: 09/25/2019 21:03   ECHOCARDIOGRAM COMPLETE  Result Date: 09/26/2019    ECHOCARDIOGRAM REPORT   Patient Name:   CLIVE PARCEL Wallander Date of Exam: 09/26/2019 Medical Rec #:  [id number]        Height:       67.0 in Accession #:    [id number]       Weight:       163.1 lb Date of Birth:  February 24, 1961        BSA:          1.855 m Patient Age:    73 years         BP:           108/87 mmHg Patient Gender: M                HR:           74 bpm. Exam Location:  [facility] Procedure: 2D Echo and Intracardiac Opacification Agent  Indications:     Cardiomyopathy  History:         Patient has no prior history of Echocardiogram examinations.                  CHF and Ischemic CM, Arrythmias:Atrial Fibrillation; Risk                  Factors:Hypertension, Dyslipidemia and Current Smoker.  Sonographer:     Carlean Jews Thornton-Maynard Referring Phys:  [id number] CARMEN L GONZALEZ Diagnosing Phys: Isaias Cowman MD IMPRESSIONS  1. Left ventricular ejection fraction, by estimation, is 20 to 25%. The left ventricle has severely decreased function. The left ventricle has no regional wall motion abnormalities. The left ventricular internal cavity size was moderately dilated. Left ventricular diastolic parameters were normal.  2. Right ventricular systolic function is normal. The right ventricular size is normal. There is moderately elevated pulmonary artery systolic pressure.  3. The mitral valve is normal in structure. Moderate to severe mitral valve regurgitation. No evidence of mitral stenosis.  4. Tricuspid valve regurgitation is moderate to severe.  5. The aortic valve is normal in structure. Aortic valve regurgitation is mild to moderate. No aortic stenosis is present.  6. The inferior vena cava is normal in size with greater than 50% respiratory variability, suggesting right atrial pressure of 3 mmHg. FINDINGS  Left Ventricle: Left ventricular ejection fraction, by estimation, is 20 to 25%. The left ventricle has severely decreased function. The left ventricle has no regional wall motion abnormalities. Definity contrast agent was given IV to delineate the left  ventricular endocardial borders. The left ventricular internal cavity size was moderately dilated. There is no left ventricular hypertrophy. Left ventricular diastolic parameters were normal. Right Ventricle: The right ventricular size is normal. No increase in right ventricular wall thickness. Right ventricular systolic function is normal. There is moderately elevated pulmonary artery systolic  pressure. The tricuspid regurgitant velocity is 3.07 m/s, and with an assumed right atrial pressure of 10 mmHg, the estimated right ventricular systolic pressure is 12.4 mmHg. Left Atrium: Left atrial size was normal in size. Right Atrium: Right atrial size was normal in size. Pericardium: There is no evidence of pericardial effusion. Mitral Valve: The mitral valve is normal in structure. Normal mobility of the mitral valve leaflets. Moderate to severe mitral valve regurgitation. No evidence of mitral valve stenosis. Tricuspid Valve: The tricuspid valve is normal in structure. Tricuspid valve regurgitation is moderate to severe. No evidence of tricuspid stenosis. Aortic Valve: The aortic valve is normal in structure. Aortic valve regurgitation is mild to moderate. Aortic regurgitation PHT measures 454 msec. No aortic stenosis is present. Aortic valve mean gradient measures 4.0 mmHg. Aortic valve peak gradient measures 8.5 mmHg. Aortic valve area, by VTI measures 2.28 cm. Pulmonic Valve: The pulmonic valve was normal in structure. Pulmonic valve regurgitation is not visualized. No evidence of pulmonic stenosis. Aorta: The aortic root is normal in size and structure. Venous: The inferior vena cava is normal in size with greater than 50% respiratory variability, suggesting right atrial pressure of 3 mmHg. IAS/Shunts: No atrial level shunt detected by color flow Doppler.  LEFT VENTRICLE PLAX 2D LVIDd:         6.72 cm  Diastology LVIDs:         6.04 cm  LV e' lateral:   3.37 cm/s LV PW:         1.11 cm  LV E/e' lateral: 18.3 LV IVS:        0.80 cm  LV e' medial:    5.38 cm/s LVOT diam:     2.70 cm  LV E/e' medial:  11.5 LV SV:         48 LV SV Index:   26 LVOT Area:     5.73 cm  RIGHT VENTRICLE RV S prime:     12.10 cm/s TAPSE (M-mode): 2.4 cm LEFT ATRIUM             Index LA diam:        3.40 cm 1.83 cm/m LA Vol (A2C):   45.8 ml 24.70 ml/m LA Vol (A4C):   51.9 ml 27.99 ml/m LA Biplane Vol: 51.8 ml 27.93 ml/m   AORTIC VALVE AV Area (Vmax):    1.76 cm AV Area (Vmean):   1.85 cm AV Area (VTI):     2.28 cm AV Vmax:  146.00 cm/s AV Vmean:          97.700 cm/s AV VTI:            0.212 m AV Peak Grad:      8.5 mmHg AV Mean Grad:      4.0 mmHg LVOT Vmax:         45.00 cm/s LVOT Vmean:        31.600 cm/s LVOT VTI:          0.084 m LVOT/AV VTI ratio: 0.40 AI PHT:            454 msec  AORTA Ao Root diam: 3.70 cm MV E velocity: 61.70 cm/s  TRICUSPID VALVE MV A velocity: 45.40 cm/s  TR Peak grad:   37.7 mmHg MV E/A ratio:  1.36        TR Vmax:        307.00 cm/s                             SHUNTS                            Systemic VTI:  0.08 m                            Systemic Diam: 2.70 cm Isaias Cowman MD Electronically signed by Isaias Cowman MD Signature Date/Time: 09/26/2019/10:56:22 AM    Final         ASSESSMENT AND PLAN    -Multidisciplinary rounds held today   Circulatory shock - present on admission -likely cardiogenic vs possible distributive shock -currently on epinephrine drip, lidocaine drip -s/p VF with shock -continue bipap -diurese post improvement in BP -Cardiology on case - appreciate input -septic workup today - patient empirically was on Zosyn for asp PNA >>Unasyn with pharmD mgt   Acute decompensated systolic CHF  -TTE - EF 10-93%  - BNP >3000 -cardiology on case - appreciate input - Disucussed with Dr Saralyn Pilar today - Severe NICM, PAF, PVT  - pulmonary edema bilaterally on CXR  -correction of electrolytes in process - pharmacy consult  -strict I&O with fluid restriction -complicated by AF, and mod-severe MR -continue Foley Catheter-assess need daily   Acute Hypoxic Respiratory Failure -due to bilateral interstitial edema with possible aspiration event -cannot diurese at this time due to hypotension with vasopresossor support -ABG - 6/21 -BIPAP - RT - weaning as able    Alcoholism with EtOH withdrawal syndrome  - implement [place] alcohol  withdrawal protocol  -CIWA  - wean off of all sedation as able -currently on Precedex  -thiamine and folate repletion without banana bag due to volume status   Severe protein calorie malnutrition    - bitemporal wasting   - albumin is low    - high likelyhood of re-feeeding syndrome    - dietary consultation    ID -continue IV abx as prescibed -follow up cultures  GI/Nutrition GI PROPHYLAXIS as indicated DIET-->TF's as tolerated Constipation protocol as indicated  ENDO - ICU hypoglycemic\Hyperglycemia protocol -check FSBS per protocol   ELECTROLYTES -follow labs as needed -replace as needed -pharmacy consultation   DVT/GI PRX ordered -SCDs  TRANSFUSIONS AS NEEDED MONITOR FSBS ASSESS the need for LABS as needed   Critical care provider statement:    Critical care time (minutes):  109   Critical care time was exclusive  of:  Separately billable procedures and treating other patients   Critical care was necessary to treat or prevent imminent or life-threatening deterioration of the following conditions:  Acute decompensated systolic chf, PAF, VF, MR, alcoholism, electrolyte derrangement, CHF, alcohol withdrawal, acute hypoxemia, multiple comorbid conditions   Critical care was time spent personally by me on the following activities:  Development of treatment plan with patient or surrogate, discussions with consultants, evaluation of patient's response to treatment, examination of patient, obtaining history from patient or surrogate, ordering and performing treatments and interventions, ordering and review of laboratory studies and re-evaluation of patient's condition.  I assumed direction of critical care for this patient from another provider in my specialty: no    This document was prepared using Dragon voice recognition software and may include unintentional dictation errors.    Ottie Glazier, M.D.  Division of [facility]

## 2019-09-27 NOTE — Progress Notes (Signed)
[facility] Cardiology  SUBJECTIVE: Patient laying in bed, with BiPAP, lethargic, on Precedex, and Levophed drip   Vitals:   09/27/19 0600 09/27/19 0615 09/27/19 0645 09/27/19 0700  BP: 93/80 98/86 94/74  93/74  Pulse: 85 94 81 78  Resp: 17 (!) 35 (!) 25 (!) 35  Temp:      TempSrc:      SpO2: 100% 100% 100% 100%  Weight:      Height:         Intake/Output Summary (Last 24 hours) at 09/27/2019 0850 Last data filed at 09/27/2019 0700 Gross per 24 hour  Intake 2582.82 ml  Output 1325 ml  Net 1257.82 ml      PHYSICAL EXAM  General: Well developed, well nourished, in no acute distress HEENT:  Normocephalic and atramatic Neck:  No JVD.  Lungs: Clear bilaterally to auscultation and percussion. Heart: HRRR . Normal S1 and S2 without gallops or murmurs.  Abdomen: Bowel sounds are positive, abdomen soft and non-tender  Msk:  Back normal, normal gait. Normal strength and tone for age. Extremities: No clubbing, cyanosis or edema.   Neuro: Alert and oriented X 3. Psych:  Good affect, responds appropriately   LABS: Basic Metabolic Panel: Recent Labs    09/26/19 0602 09/26/19 0602 09/26/19 1757 09/27/19 0427  NA 134*   < > 136 135  K 2.8*   < > 2.7* 3.3*  CL 90*   < > 92* 93*  CO2 30   < > 33* 26  GLUCOSE 251*   < > 129* 169*  BUN 8   < > 9 12  CREATININE 1.17   < > 1.25* 1.56*  CALCIUM 7.8*   < > 7.5* 7.3*  MG 2.8*  --   --  2.3  PHOS 3.1  --   --  4.7*   < > = values in this interval not displayed.   Liver Function Tests: Recent Labs    09/25/19 2054 09/26/19 0602  AST 174* 145*  ALT 51* 46*  ALKPHOS 36* 29*  BILITOT 1.1 1.3*  PROT 5.9* 5.6*  ALBUMIN 2.6* 2.5*   Recent Labs    09/25/19 0608  LIPASE 84*   CBC: Recent Labs    09/26/19 0602 09/27/19 0427  WBC 9.3 9.4  NEUTROABS  --  7.8*  HGB 9.4* 9.4*  HCT 26.9* 25.9*  MCV 97.8 95.9  PLT 143* 135*   Cardiac Enzymes: No results for input(s): CKTOTAL, CKMB, CKMBINDEX, TROPONINI in the last 72  hours. BNP: Invalid input(s): POCBNP D-Dimer: No results for input(s): DDIMER in the last 72 hours. Hemoglobin A1C: Recent Labs    09/26/19 0050  HGBA1C 5.1   Fasting Lipid Panel: No results for input(s): CHOL, HDL, LDLCALC, TRIG, CHOLHDL, LDLDIRECT in the last 72 hours. Thyroid Function Tests: Recent Labs    09/25/19 0643  TSH 0.860   Anemia Panel: No results for input(s): VITAMINB12, FOLATE, FERRITIN, TIBC, IRON, RETICCTPCT in the last 72 hours.  DG Chest Port 1 View  Result Date: 09/26/2019 CLINICAL DATA:  Acute respiratory failure EXAM: PORTABLE CHEST 1 VIEW COMPARISON:  September 25, 2019 FINDINGS: The NG tube terminates below today's film. The cardiomediastinal silhouette is stable. Increased interstitial markings bilaterally are similar to mildly improved in the interval. Transcutaneous pacer leads are noted. No other acute abnormalities are identified. IMPRESSION: 1. Cardiomegaly and pulmonary edema persists, similar in the interval. 2. Support apparatus as above. Electronically Signed   By: Dorise Bullion III M.D   On:  09/26/2019 11:28   DG Chest Port 1 View  Result Date: 09/25/2019 CLINICAL DATA:  Cardiac arrest.  NG tube placement. EXAM: PORTABLE CHEST 1 VIEW COMPARISON:  Chest radiograph 08/10/2019. FINDINGS: Tip and side port of the enteric tube below the diaphragm in the stomach. Cardiomegaly. There is pulmonary edema. Suspected small right left pleural effusion. No evidence of pneumothorax. Multiple overlying monitoring devices in place. IMPRESSION: 1. Tip and side port of the enteric tube below the diaphragm in the stomach. 2. Cardiomegaly with pulmonary edema and small pleural effusions. Electronically Signed   By: Keith Rake M.D.   On: 09/25/2019 21:03   ECHOCARDIOGRAM COMPLETE  Result Date: 09/26/2019    ECHOCARDIOGRAM REPORT   Patient Name:   JOSUHA FONTANEZ Selvy Date of Exam: 09/26/2019 Medical Rec #:  [id number]        Height:       67.0 in Accession #:    [id number]        Weight:       163.1 lb Date of Birth:  October 26, 1960        BSA:          1.855 m Patient Age:    59 years         BP:           108/87 mmHg Patient Gender: M                HR:           74 bpm. Exam Location:  [facility] Procedure: 2D Echo and Intracardiac Opacification Agent Indications:     Cardiomyopathy  History:         Patient has no prior history of Echocardiogram examinations.                  CHF and Ischemic CM, Arrythmias:Atrial Fibrillation; Risk                  Factors:Hypertension, Dyslipidemia and Current Smoker.  Sonographer:     Carlean Jews Thornton-Maynard Referring Phys:  [id number] CARMEN L GONZALEZ Diagnosing Phys: Isaias Cowman MD IMPRESSIONS  1. Left ventricular ejection fraction, by estimation, is 20 to 25%. The left ventricle has severely decreased function. The left ventricle has no regional wall motion abnormalities. The left ventricular internal cavity size was moderately dilated. Left ventricular diastolic parameters were normal.  2. Right ventricular systolic function is normal. The right ventricular size is normal. There is moderately elevated pulmonary artery systolic pressure.  3. The mitral valve is normal in structure. Moderate to severe mitral valve regurgitation. No evidence of mitral stenosis.  4. Tricuspid valve regurgitation is moderate to severe.  5. The aortic valve is normal in structure. Aortic valve regurgitation is mild to moderate. No aortic stenosis is present.  6. The inferior vena cava is normal in size with greater than 50% respiratory variability, suggesting right atrial pressure of 3 mmHg. FINDINGS  Left Ventricle: Left ventricular ejection fraction, by estimation, is 20 to 25%. The left ventricle has severely decreased function. The left ventricle has no regional wall motion abnormalities. Definity contrast agent was given IV to delineate the left  ventricular endocardial borders. The left ventricular internal cavity size was moderately dilated. There is no left  ventricular hypertrophy. Left ventricular diastolic parameters were normal. Right Ventricle: The right ventricular size is normal. No increase in right ventricular wall thickness. Right ventricular systolic function is normal. There is moderately elevated pulmonary artery systolic pressure. The tricuspid regurgitant velocity is 3.07  m/s, and with an assumed right atrial pressure of 10 mmHg, the estimated right ventricular systolic pressure is 09.6 mmHg. Left Atrium: Left atrial size was normal in size. Right Atrium: Right atrial size was normal in size. Pericardium: There is no evidence of pericardial effusion. Mitral Valve: The mitral valve is normal in structure. Normal mobility of the mitral valve leaflets. Moderate to severe mitral valve regurgitation. No evidence of mitral valve stenosis. Tricuspid Valve: The tricuspid valve is normal in structure. Tricuspid valve regurgitation is moderate to severe. No evidence of tricuspid stenosis. Aortic Valve: The aortic valve is normal in structure. Aortic valve regurgitation is mild to moderate. Aortic regurgitation PHT measures 454 msec. No aortic stenosis is present. Aortic valve mean gradient measures 4.0 mmHg. Aortic valve peak gradient measures 8.5 mmHg. Aortic valve area, by VTI measures 2.28 cm. Pulmonic Valve: The pulmonic valve was normal in structure. Pulmonic valve regurgitation is not visualized. No evidence of pulmonic stenosis. Aorta: The aortic root is normal in size and structure. Venous: The inferior vena cava is normal in size with greater than 50% respiratory variability, suggesting right atrial pressure of 3 mmHg. IAS/Shunts: No atrial level shunt detected by color flow Doppler.  LEFT VENTRICLE PLAX 2D LVIDd:         6.72 cm  Diastology LVIDs:         6.04 cm  LV e' lateral:   3.37 cm/s LV PW:         1.11 cm  LV E/e' lateral: 18.3 LV IVS:        0.80 cm  LV e' medial:    5.38 cm/s LVOT diam:     2.70 cm  LV E/e' medial:  11.5 LV SV:         48 LV SV  Index:   26 LVOT Area:     5.73 cm  RIGHT VENTRICLE RV S prime:     12.10 cm/s TAPSE (M-mode): 2.4 cm LEFT ATRIUM             Index LA diam:        3.40 cm 1.83 cm/m LA Vol (A2C):   45.8 ml 24.70 ml/m LA Vol (A4C):   51.9 ml 27.99 ml/m LA Biplane Vol: 51.8 ml 27.93 ml/m  AORTIC VALVE AV Area (Vmax):    1.76 cm AV Area (Vmean):   1.85 cm AV Area (VTI):     2.28 cm AV Vmax:           146.00 cm/s AV Vmean:          97.700 cm/s AV VTI:            0.212 m AV Peak Grad:      8.5 mmHg AV Mean Grad:      4.0 mmHg LVOT Vmax:         45.00 cm/s LVOT Vmean:        31.600 cm/s LVOT VTI:          0.084 m LVOT/AV VTI ratio: 0.40 AI PHT:            454 msec  AORTA Ao Root diam: 3.70 cm MV E velocity: 61.70 cm/s  TRICUSPID VALVE MV A velocity: 45.40 cm/s  TR Peak grad:   37.7 mmHg MV E/A ratio:  1.36        TR Vmax:        307.00 cm/s  SHUNTS                            Systemic VTI:  0.08 m                            Systemic Diam: 2.70 cm Isaias Cowman MD Electronically signed by Isaias Cowman MD Signature Date/Time: 09/26/2019/10:56:22 AM    Final      Echo LVEF 20 to 25% with severe dilated cardiomyopathy  TELEMETRY: Sinus rhythm:  ASSESSMENT AND PLAN:  Active Problems:   Alcohol abuse   Ventricular tachyarrhythmia (HCC)   Acute respiratory failure (HCC)   Atrial fibrillation with RVR ([place])    1.  Polymorphic VT, while on amiodarone with QT prolongation, exacerbated by severe electrolyte abnormalities, switch to lidocaine, currently in sinus rhythm 2.  Paroxysmal atrial fibrillation with rapid ventricular rate, in the setting of electrolyte abnormalities, converted to sinus rhythm on amiodarone infusion, remains in sinus rhythm off of amiodarone 3.  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  Chronic systolic congestive heart failure, on IV ethacrynic acid to avoid further potassium depletion 5.  Hypokalemia, in the setting of chronic EtOH  abuse, low normal potassium levels despite multiple doses of IV potassium 6.  Hypomagnesemia 7.  EtOH abuse 8.  Hypotension, multifactorial, secondary to above, on Levophed  Recommendations  1.  Agree with current therapy 2.  Continue lidocaine drip until electrolyte abnormalities remain stable 3.  Continue to replete potassium 4.  Defer anticoagulation for atrial fibrillation 5.  Continue IV ethacrynic acid for diuresis to avoid further potassium depletion 6.  Resume carvedilol, Entresto and spironolactone when patient is clinically and hemodynamically stable  Isaias Cowman, MD, PhD, [facility] 09/27/2019 8:50 AM

## 2019-09-27 NOTE — Progress Notes (Signed)
[place] for Electrolyte Monitoring and Replacement   Recent Labs: Potassium (mmol/L)  Date Value  09/27/2019 5.1   Magnesium (mg/dL)  Date Value  09/27/2019 2.3   Calcium (mg/dL)  Date Value  09/27/2019 7.3 (L)   Albumin (g/dL)  Date Value  09/26/2019 2.5 (L)   Phosphorus (mg/dL)  Date Value  09/27/2019 4.7 (H)   Sodium (mmol/L)  Date Value  09/27/2019 135     Assessment: 59 year old male with weakness, emesis. Patient with significant electrolyte abnormalities. amio d/c'ed, on lidocaine infusion.   Goal of Therapy:  Electrolytes WNL, K ~ 4 and Mg ~ 2 in setting of afib  Plan:  Potassium replaced this morning. Repeat 5.1, no additional replacement indicated. Electrolytes with morning labs.  Tawnya Crook ,PharmD Clinical Pharmacist 09/27/2019 3:46 PM

## 2019-09-27 NOTE — Progress Notes (Addendum)
Trial off of Bipap, on 10L HFNC

## 2019-09-27 NOTE — Progress Notes (Signed)
Pharmacy Antibiotic Note  Alexander Welch is a 59 y.o. male admitted on 09/25/2019 with pneumonia.  Pharmacy has been consulted for Unasyn dosing.  Plan: Unasyn 3 g IV q6h  Height: 5\' 7"  (170.2 cm) Weight: 77.7 kg (171 lb 4.8 oz) IBW/kg (Calculated) : 66.1  Temp (24hrs), Avg:99 F (37.2 C), Min:97.8 F (36.6 C), Max:99.7 F (37.6 C)  Recent Labs  Lab 09/25/19 0608 09/25/19 1746 09/25/19 2054 09/26/19 0050 09/26/19 0602 09/26/19 1757 09/27/19 0427  WBC 8.7  --  6.4 7.4 9.3  --  9.4  CREATININE 1.38*   < > 1.22 1.13 1.17 1.25* 1.56*   < > = values in this interval not displayed.    Estimated Creatinine Clearance: 47.1 mL/min (A) (by C-G formula based on SCr of 1.56 mg/dL (H)).    No Known Allergies   Thank you for allowing pharmacy to be a part of this patient's care.  Dorena Bodo, PharmD Clinical Pharmacist 09/27/2019 1:20 PM

## 2019-09-28 ENCOUNTER — Inpatient Hospital Stay: Payer: BC Managed Care – PPO

## 2019-09-28 LAB — BLOOD GAS, ARTERIAL
Acid-base deficit: 8.3 mmol/L — ABNORMAL HIGH (ref 0.0–2.0)
Acid-base deficit: 2.6 mmol/L — ABNORMAL HIGH (ref 0.0–2.0)
Acid-base deficit: 5.6 mmol/L — ABNORMAL HIGH (ref 0.0–2.0)
Bicarbonate: 14.6 mmol/L — ABNORMAL LOW (ref 20.0–28.0)
Bicarbonate: 20.1 mmol/L (ref 20.0–28.0)
Bicarbonate: 21.1 mmol/L (ref 20.0–28.0)
Delivery systems: POSITIVE
Expiratory PAP: 10
Expiratory PAP: 10
FIO2: 0.4
FIO2: 0.3
FIO2: 0.4
Inspiratory PAP: 15
Inspiratory PAP: 15
MECHVT: 500 mL
Mechanical Rate: 15
Mode: POSITIVE
O2 Saturation: 94.5 %
O2 Saturation: 95.2 %
O2 Saturation: 94 %
PEEP: 5 cmH2O
Patient temperature: 37
Patient temperature: 37
Patient temperature: 37
RATE: 15 resp/min
pCO2 arterial: 22 mmHg — ABNORMAL LOW (ref 32.0–48.0)
pCO2 arterial: 27 mmHg — ABNORMAL LOW (ref 32.0–48.0)
pCO2 arterial: 45 mmHg (ref 32.0–48.0)
pH, Arterial: 7.43 (ref 7.350–7.450)
pH, Arterial: 7.48 — ABNORMAL HIGH (ref 7.350–7.450)
pH, Arterial: 7.28 — ABNORMAL LOW (ref 7.350–7.450)
pO2, Arterial: 71 mmHg — ABNORMAL LOW (ref 83.0–108.0)
pO2, Arterial: 71 mmHg — ABNORMAL LOW (ref 83.0–108.0)
pO2, Arterial: 80 mmHg — ABNORMAL LOW (ref 83.0–108.0)

## 2019-09-28 LAB — BASIC METABOLIC PANEL
Anion gap: 22 — ABNORMAL HIGH (ref 5–15)
BUN: 19 mg/dL (ref 6–20)
CO2: 18 mmol/L — ABNORMAL LOW (ref 22–32)
Calcium: 7 mg/dL — ABNORMAL LOW (ref 8.9–10.3)
Chloride: 96 mmol/L — ABNORMAL LOW (ref 98–111)
Creatinine, Ser: 1.95 mg/dL — ABNORMAL HIGH (ref 0.61–1.24)
GFR calc Af Amer: 42 mL/min — ABNORMAL LOW (ref >60)
GFR calc non Af Amer: 36 mL/min — ABNORMAL LOW (ref >60)
Glucose, Bld: 117 mg/dL — ABNORMAL HIGH (ref 70–99)
Potassium: 4.4 mmol/L (ref 3.5–5.1)
Sodium: 136 mmol/L (ref 135–145)

## 2019-09-28 LAB — CBC WITH DIFFERENTIAL/PLATELET
Abs Immature Granulocytes: 0.05 10*3/uL (ref 0.00–0.07)
Basophils Absolute: 0 10*3/uL (ref 0.0–0.1)
Basophils Relative: 0 %
Eosinophils Absolute: 0 10*3/uL (ref 0.0–0.5)
Eosinophils Relative: 0 %
HCT: 25.9 % — ABNORMAL LOW (ref 39.0–52.0)
Hemoglobin: 9 g/dL — ABNORMAL LOW (ref 13.0–17.0)
Immature Granulocytes: 1 %
Lymphocytes Relative: 8 %
Lymphs Abs: 0.7 10*3/uL (ref 0.7–4.0)
MCH: 34.7 pg — ABNORMAL HIGH (ref 26.0–34.0)
MCHC: 34.7 g/dL (ref 30.0–36.0)
MCV: 100 fL (ref 80.0–100.0)
Monocytes Absolute: 0.7 10*3/uL (ref 0.1–1.0)
Monocytes Relative: 7 %
Neutro Abs: 7.7 10*3/uL (ref 1.7–7.7)
Neutrophils Relative %: 84 %
Platelets: 117 10*3/uL — ABNORMAL LOW (ref 150–400)
RBC: 2.59 MIL/uL — ABNORMAL LOW (ref 4.22–5.81)
RDW: 12.7 % (ref 11.5–15.5)
WBC: 9.2 10*3/uL (ref 4.0–10.5)
nRBC: 0.4 % — ABNORMAL HIGH (ref 0.0–0.2)

## 2019-09-28 LAB — LACTIC ACID, PLASMA
Lactic Acid, Venous: 10.5 mmol/L (ref 0.5–1.9)
Lactic Acid, Venous: 10.8 mmol/L (ref 0.5–1.9)
Lactic Acid, Venous: 8.1 mmol/L (ref 0.5–1.9)

## 2019-09-28 LAB — MAGNESIUM: Magnesium: 2.2 mg/dL (ref 1.7–2.4)

## 2019-09-28 LAB — GLUCOSE, CAPILLARY
Glucose-Capillary: 88 mg/dL (ref 70–99)
Glucose-Capillary: 129 mg/dL — ABNORMAL HIGH (ref 70–99)

## 2019-09-28 MED ORDER — FENTANYL CITRATE (PF) 100 MCG/2ML IJ SOLN
INTRAMUSCULAR | Status: AC
  Administered 2019-09-28: 100 ug via INTRAVENOUS
  Filled 2019-09-28: qty 2

## 2019-09-28 MED ORDER — CALCIUM GLUCONATE-NACL 1-0.675 GM/50ML-% IV SOLN
1.0000 g | Freq: Once | INTRAVENOUS | Status: DC
  Filled 2019-09-28 (×3): qty 50

## 2019-09-28 MED ORDER — SODIUM CHLORIDE 0.9 % IV SOLN
1.0000 g | Freq: Once | INTRAVENOUS | Status: AC
  Administered 2019-09-28: 1 g via INTRAVENOUS
  Filled 2019-09-28: qty 10

## 2019-09-28 MED ORDER — MIDAZOLAM HCL 2 MG/2ML IJ SOLN
INTRAMUSCULAR | Status: AC
  Administered 2019-09-28: 4 mg via INTRAVENOUS
  Filled 2019-09-28: qty 4

## 2019-09-28 MED ORDER — DEXMEDETOMIDINE HCL IN NACL 400 MCG/100ML IV SOLN
0.4000 ug/kg/h | INTRAVENOUS | Status: DC
  Administered 2019-09-28: 0.4 ug/kg/h via INTRAVENOUS
  Administered 2019-09-29 – 2019-09-30 (×4): 1 ug/kg/h via INTRAVENOUS
  Filled 2019-09-28 (×5): qty 100

## 2019-09-28 MED ORDER — CHLORHEXIDINE GLUCONATE 0.12% ORAL RINSE (MEDLINE KIT)
15.0000 mL | Freq: Two times a day (BID) | OROMUCOSAL | Status: DC
  Administered 2019-09-28 – 2019-10-04 (×7): 15 mL via OROMUCOSAL
  Filled 2019-09-28: qty 15

## 2019-09-28 MED ORDER — MIDAZOLAM HCL 2 MG/2ML IJ SOLN: 4.0000 mg | Freq: Once | INTRAMUSCULAR | Status: AC

## 2019-09-28 MED ORDER — FENTANYL CITRATE (PF) 100 MCG/2ML IJ SOLN: 100.0000 ug | Freq: Once | INTRAMUSCULAR | Status: AC

## 2019-09-28 MED ORDER — ROCURONIUM BROMIDE 50 MG/5ML IV SOLN
INTRAVENOUS | Status: AC
  Administered 2019-09-28: 40 mg via INTRAVENOUS
  Filled 2019-09-28: qty 1

## 2019-09-28 MED ORDER — FENTANYL 2500MCG IN NS 250ML (10MCG/ML) PREMIX INFUSION
INTRAVENOUS | Status: AC
  Administered 2019-09-28: 50 ug/h via INTRAVENOUS
  Filled 2019-09-28: qty 250

## 2019-09-28 MED ORDER — FENTANYL 2500MCG IN NS 250ML (10MCG/ML) PREMIX INFUSION
0.0000 ug/h | INTRAVENOUS | Status: DC
  Administered 2019-09-29: 200 ug/h via INTRAVENOUS
  Administered 2019-09-29: 100 ug/h via INTRAVENOUS
  Filled 2019-09-28 (×2): qty 250

## 2019-09-28 MED ORDER — SODIUM BICARBONATE 8.4 % IV SOLN
100.0000 meq | Freq: Once | INTRAVENOUS | Status: AC
  Administered 2019-09-28: 100 meq via INTRAVENOUS
  Filled 2019-09-28: qty 100

## 2019-09-28 MED ORDER — ORAL CARE MOUTH RINSE
15.0000 mL | OROMUCOSAL | Status: DC
  Administered 2019-09-28 – 2019-09-30 (×17): 15 mL via OROMUCOSAL

## 2019-09-28 MED ORDER — ROCURONIUM BROMIDE 50 MG/5ML IV SOLN: 40.0000 mg | Freq: Once | INTRAVENOUS | Status: AC

## 2019-09-28 MED ORDER — ALBUMIN HUMAN 25 % IV SOLN
12.5000 g | Freq: Every day | INTRAVENOUS | Status: DC
  Administered 2019-09-28 – 2019-09-29 (×2): 12.5 g via INTRAVENOUS
  Filled 2019-09-28 (×2): qty 50

## 2019-09-28 NOTE — TOC Progression Note (Signed)
Transition of Care [facility]) - Progression Note    Patient Details  Name: Alexander Welch MRN: [id number] Date of Birth: 08-Oct-1960  Transition of [facility]) CM/SW [place], [place] Phone Number: [phone] 09/28/2019, 10:33 AM  Clinical Narrative:     CSW spoke with [facility] [phone], who was concerned with the possible medical expenses for the patient's ICU stay.  This CSW explained to Ms. Leap that she will need to contact the insurance company and ask about what they cover and if she has any questions or concerns to reach out to this CSW.  Ms. Macqueen verbalized understanding.       Expected Discharge Plan and Services                                                 Social Determinants of Health (SDOH) Interventions    Readmission Risk Interventions No flowsheet data found.

## 2019-09-28 NOTE — Progress Notes (Signed)
[place] for Electrolyte Monitoring and Replacement   Recent Labs: Potassium (mmol/L)  Date Value  09/28/2019 4.4   Magnesium (mg/dL)  Date Value  09/28/2019 2.2   Calcium (mg/dL)  Date Value  09/28/2019 7.0 (L)   Albumin (g/dL)  Date Value  09/26/2019 2.5 (L)   Phosphorus (mg/dL)  Date Value  09/27/2019 4.7 (H)   Sodium (mmol/L)  Date Value  09/28/2019 136     Assessment: 59 year old male with weakness, emesis. Patient with significant electrolyte abnormalities requiring replacement for several days. Patient now off lidocaine drip 6/22.  Goal of Therapy:  Electrolytes WNL, K ~ 4 and Mg ~ 2 in setting of afib  Plan:  Electrolytes WNL today. Will recheck with morning labs to ensure electrolytes remain stable.  Tawnya Crook ,PharmD Clinical Pharmacist 09/28/2019 10:58 AM

## 2019-09-28 NOTE — Progress Notes (Signed)
[facility] Cardiology    SUBJECTIVE: The patient is able to answer 'yes' or 'no' questions as he is currently on BiPAP with draining NG tube in place. He denies chest pain or palpitations.   Vitals:   09/28/19 0500 09/28/19 0600 09/28/19 0700 09/28/19 0743  BP: 127/87 126/90 117/86 117/86  Pulse: 96 82 91 91  Resp: (!) 26 (!) 32 (!) 34 (!) 34  Temp:   97.9 F (36.6 C) 97.9 F (36.6 C)  TempSrc:      SpO2: 95% 100% 91% 91%  Weight:      Height:         Intake/Output Summary (Last 24 hours) at 09/28/2019 0820 Last data filed at 09/28/2019 0618 Gross per 24 hour  Intake 1847.71 ml  Output 355 ml  Net 1492.71 ml      PHYSICAL EXAM  General: Ill-appearing, lying in bed on multiple drips on BiPAP with NG tube, resting HEENT:  Normocephalic and atramatic Neck:  No JVD.  Lungs: normal effort of breathing on BiPAP Heart: HRRR . Normal S1 and S2 without gallops or murmurs.  Abdomen: Bowel sounds are positive Msk: no obvious deformities Extremities: No clubbing, cyanosis or edema.   Neuro: Alert and oriented X 3.    LABS: Basic Metabolic Panel: Recent Labs    09/26/19 0602 09/26/19 1757 09/27/19 0427 09/27/19 0427 09/27/19 1445 09/28/19 0426  NA 134*   < > 135  --   --  136  K 2.8*   < > 3.3*   < > 5.1 4.4  CL 90*   < > 93*  --   --  96*  CO2 30   < > 26  --   --  18*  GLUCOSE 251*   < > 169*  --   --  117*  BUN 8   < > 12  --   --  19  CREATININE 1.17   < > 1.56*  --   --  1.95*  CALCIUM 7.8*   < > 7.3*  --   --  7.0*  MG 2.8*  --  2.3  --   --  2.2  PHOS 3.1  --  4.7*  --   --   --    < > = values in this interval not displayed.   Liver Function Tests: Recent Labs    09/25/19 2054 09/26/19 0602  AST 174* 145*  ALT 51* 46*  ALKPHOS 36* 29*  BILITOT 1.1 1.3*  PROT 5.9* 5.6*  ALBUMIN 2.6* 2.5*   No results for input(s): LIPASE, AMYLASE in the last 72 hours. CBC: Recent Labs    09/27/19 0427 09/28/19 0558  WBC 9.4 9.2  NEUTROABS 7.8* 7.7  HGB 9.4* 9.0*   HCT 25.9* 25.9*  MCV 95.9 100.0  PLT 135* 117*   Cardiac Enzymes: No results for input(s): CKTOTAL, CKMB, CKMBINDEX, TROPONINI in the last 72 hours. BNP: Invalid input(s): POCBNP D-Dimer: No results for input(s): DDIMER in the last 72 hours. Hemoglobin A1C: Recent Labs    09/26/19 0050  HGBA1C 5.1   Fasting Lipid Panel: No results for input(s): CHOL, HDL, LDLCALC, TRIG, CHOLHDL, LDLDIRECT in the last 72 hours. Thyroid Function Tests: No results for input(s): TSH, T4TOTAL, T3FREE, THYROIDAB in the last 72 hours.  Invalid input(s): FREET3 Anemia Panel: No results for input(s): VITAMINB12, FOLATE, FERRITIN, TIBC, IRON, RETICCTPCT in the last 72 hours.  DG Chest Port 1 View  Result Date: 09/27/2019 CLINICAL DATA:  Abnormal respiration. EXAM:  PORTABLE CHEST 1 VIEW COMPARISON:  Radiograph yesterday.  CT 03/25/2019 FINDINGS: Enteric tube remains in place. Cardiomegaly appears similar. Unchanged mediastinal contours. Slight worsening in bronchial and interstitial thickening from prior exam. Suspected small pleural effusions with minimal fluid in the right minor fissure. No pneumothorax. IMPRESSION: 1. Slight worsening in pulmonary edema. Small pleural effusions. 2. Stable cardiomegaly. Electronically Signed   By: Keith Rake M.D.   On: 09/27/2019 21:56     Echo LVEF 20 to 25% with severe dilated cardiomyopathy  TELEMETRY: sinus rhythm, 94 bpm  ASSESSMENT AND PLAN:  Active Problems:   Alcohol abuse   Ventricular tachyarrhythmia (HCC)   Acute respiratory failure (HCC)   Atrial fibrillation with RVR (HCC)    1. Polymorphic VT, while on amiodarone with QT prolongation, exacerbated by severe electrolyte abnormalities, switched to lidocaine, currently in sinus rhythm.  2.  Paroxysmal atrial fibrillation with rapid ventricular rate, in the setting of electrolyte abnormalities, converted to sinus rhythm on amiodarone infusion, remains in sinus rhythm off of amiodarone 3.  Severe  nonischemic dilated cardiomyopathy, LVEF 20 to 25% 4.  Chronic systolic congestive heart failure, on IV ethacrynic acid to avoid further potassium depletion  5.  Hypokalemia, in the setting of chronic EtOH abuse, received multiple doses of IV potassium; resolved; K currently 4.4 6.  Hypomagnesemia 7.  EtOH abuse 8.  Hypotension, multifactorial, secondary to above, on Levophed  Recommendations 1.  Agree with current therapy 2.  Discontinue lidocaine drip 3.  Defer anticoagulation for atrial fibrillation 4.  Continue IV ethacrynic acid for diuresis to avoid further potassium depletion 5.  Resume carvedilol, Entresto and spironolactone when patient is clinically and hemodynamically stable   Clabe Seal, PA-C 09/28/2019 8:20 AM

## 2019-09-28 NOTE — Progress Notes (Signed)
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 Noted to be hypotensive tachycardic but lucid and AAOx4 per H/P. He had electrolyte derrangments with devt of AFrVR. While in MICU he went into VF received 200J defib with ROSC and was able to speak immediately after and was subsequently in sinus rythm.   Lines / Drains: R-fem-TLC PIV  Cultures / Sepsis markers: -Blood culture x2 -pending -Procal with mild elevation - 0.3   Antibiotics: Zosyn>>unasy for empiric aspiration    Protocols / Consultants: Cardio, pccm  Tests / Events: TTE   09/28/19- Goals of care discussion with wife at bedside, explained patient with worsening lactate, worsening pulm edema and overall poor prognosis remains on vasopressor support with advanced CHF and alcoholism.  Wife wishes to continue fully aggressive care.  Explained high risk for intubation and family wishes are to continue and proceed with intubation but not for prolonged periods and continue full code scope of care at this time.   PAST MEDICAL HISTORY   Past Medical History:  Diagnosis Date   Abnormal CT scan, colon 11/27/2017   Alcohol abuse    Cardiomyopathy ([place])    CHF (congestive heart failure) (HCC)    Colon polyps    ED (erectile dysfunction)    Hypertension    Hypertriglyceridemia    Osteoarthritis    Osteoarthritis      SURGICAL HISTORY   Past Surgical History:  Procedure Laterality Date   COLONOSCOPY     COLONOSCOPY WITH  PROPOFOL N/A 02/02/2018   Procedure: COLONOSCOPY WITH PROPOFOL;  Surgeon: Manya Silvas, MD;  Location: [facility] ENDOSCOPY;  Service: Endoscopy;  Laterality: N/A;   LOWER EXTREMITY ANGIOGRAPHY Right 06/21/2019   Procedure: LOWER EXTREMITY ANGIOGRAPHY;  Surgeon: Algernon Huxley, MD;  Location: [place] [facility] LAB;  Service: Cardiovascular;  Laterality: Right;     FAMILY HISTORY   Family History  Problem Relation Age of Onset   Heart failure Mother    Renal Disease Mother    Bone cancer Father      SOCIAL HISTORY   Social History   Tobacco Use   Smoking status: Current Every Day Smoker    Packs/day: 1.00    Years: 40.00    Pack years: 40.00    Types: Cigarettes   Smokeless tobacco: Never Used  Vaping Use   Vaping Use: Never used  Substance Use Topics   Alcohol use: Yes    Comment: 1/4 pint of vodka daily   Drug use: Yes    Types: Marijuana     MEDICATIONS   Current Medication:  Current Facility-Administered Medications:    0.9 %  sodium chloride infusion, 250 mL, Intravenous, Continuous, Hinda Kehr, MD, Last Rate: 10 mL/hr at 09/25/19 1600, Rate Verify at 09/25/19 1600   0.9 %  sodium chloride infusion, 250 mL, Intravenous, Continuous, Tyler Pita, MD, Stopped at 09/25/19 1211   Ampicillin-Sulbactam (UNASYN) 3 g in sodium chloride 0.9 % 100 mL IVPB, 3 g, Intravenous, Q6H, Tyler Pita, MD, Last Rate: 200 mL/hr at 09/28/19 0800, 3 g at 09/28/19 0800   Chlorhexidine Gluconate Cloth 2 % PADS 6 each, 6 each, Topical, Q0600,  Tyler Pita, MD, 6 each at 09/27/19 2243   docusate sodium (COLACE) capsule 100 mg, 100 mg, Oral, BID PRN, Tyler Pita, MD   enoxaparin (LOVENOX) injection 40 mg, 40 mg, Subcutaneous, Q24H, Vernard Gambles L, MD, 40 mg at 09/27/19 1035   insulin aspart (novoLOG) injection 0-6 Units, 0-6 Units, Subcutaneous, Q4H, Tukov-Yual, Magdalene S, NP, 2 Units at 09/26/19 0809   [COMPLETED] lidocaine (XYLOCAINE) 4 mg/mL  bolus via infusion 75 mg, 75 mg, Intravenous, Once, 75 mg at 09/25/19 2156 **AND** lidocaine (cardiac) 2000 mg in dextrose 5% 500 mL (4mg /mL) IV infusion, 2 mg/min, Intravenous, Continuous, Shanlever, Pierce Crane, [facility], Last Rate: 30 mL/hr at 09/28/19 0618, 2 mg/min at 09/28/19 0618   LORazepam (ATIVAN) injection 1-2 mg, 1-2 mg, Intravenous, Q1H PRN, Tyler Pita, MD, 2 mg at 09/27/19 1201   MEDLINE mouth rinse, 15 mL, Mouth Rinse, BID, Tyler Pita, MD, 15 mL at 09/27/19 2115   nicotine (NICODERM CQ - dosed in mg/24 hours) patch 21 mg, 21 mg, Transdermal, Daily, Tyler Pita, MD, 21 mg at 09/27/19 1033   norepinephrine (LEVOPHED) 16 mg in 271mL premix infusion, 0-40 mcg/min, Intravenous, Titrated, Tukov-Yual, Magdalene S, NP, Last Rate: 12.19 mL/hr at 09/28/19 0618, 13 mcg/min at 09/28/19 0618   ondansetron (ZOFRAN) injection 4 mg, 4 mg, Intravenous, Q6H PRN, Tyler Pita, MD   [START ON 09/29/2019] pantoprazole (PROTONIX) injection 40 mg, 40 mg, Intravenous, Q12H, Tukov-Yual, Magdalene S, NP   thiamine 500mg  in normal saline (65ml) IVPB, 500 mg, Intravenous, Daily, Tyler Pita, MD, Stopped at 09/27/19 1115    ALLERGIES   Patient has no known allergies.    REVIEW OF SYSTEMS    Patient confused on sedation while in alcohol withdrawal  PHYSICAL EXAMINATION   Vital Signs: Temp:  [97.6 F (36.4 C)-98 F (36.7 C)] 97.9 F (36.6 C) (06/22 0743) Pulse Rate:  [77-96] 91 (06/22 0743) Resp:  [19-42] 34 (06/22 0743) BP: (101-127)/(77-90) 117/86 (06/22 0743) SpO2:  [90 %-100 %] 91 % (06/22 0743) FiO2 (%):  [35 %-50 %] 35 % (06/22 0743) Weight:  [77.4 kg] 77.4 kg (06/22 0425)  GENERAL:NAD, confusion+ HEAD: Normocephalic, atraumatic.  EYES: Pupils equal, round, reactive to light.  No scleral icterus.  MOUTH: Moist mucosal membrane. NECK: Supple. No thyromegaly. No nodules. No JVD.  PULMONARY: bilateral inspiratory and expiratory crackles CARDIOVASCULAR:  S1 and S2. Regular rate and rhythm. No murmurs, rubs, or gallops.  GASTROINTESTINAL: Soft, nontender, non-distended. No masses. Positive bowel sounds. No hepatosplenomegaly.  MUSCULOSKELETAL: No swelling, clubbing, or edema.  NEUROLOGIC:GCS10 on sedation SKIN:intact,warm,dry   PERTINENT DATA     Infusions:  sodium chloride 10 mL/hr at 09/25/19 1600   sodium chloride Stopped (09/25/19 1211)   ampicillin-sulbactam (UNASYN) IV 3 g (09/28/19 0800)   lidocaine 2 mg/min (09/28/19 0618)   norepinephrine (LEVOPHED) Adult infusion 13 mcg/min (09/28/19 0618)   thiamine injection Stopped (09/27/19 1115)   Scheduled Medications:  Chlorhexidine Gluconate Cloth  6 each Topical Q0600   enoxaparin (LOVENOX) injection  40 mg Subcutaneous Q24H   insulin aspart  0-6 Units Subcutaneous Q4H   mouth rinse  15 mL Mouth Rinse BID   nicotine  21 mg Transdermal Daily   [START ON 09/29/2019] pantoprazole  40 mg Intravenous Q12H   PRN Medications: docusate sodium, LORazepam, ondansetron (ZOFRAN) IV Hemodynamic parameters:   Intake/Output: 06/21 0701 - 06/22 0700 In: 1847.7 [I.V.:1251.1; IV Piggyback:596.7] Out: 375 [Urine:375]  Ventilator  Settings: FiO2 (%):  [35 %-50 %]  35 %   LAB RESULTS:  Basic Metabolic Panel: Recent Labs  Lab 09/25/19 0608 09/25/19 0643 09/25/19 1746 09/25/19 1746 09/25/19 2054 09/25/19 2054 09/26/19 0050 09/26/19 0050 09/26/19 0602 09/26/19 0602 09/26/19 1757 09/26/19 1757 09/27/19 0427 09/27/19 0427 09/27/19 1445 09/28/19 0426  NA   < >  --  133*   < > 134*   < > 133*  --  134*  --  136  --  135  --   --  136  K   < > <2.0* 2.2*   < > <2.0*   < > 2.4*   < > 2.8*   < > 2.7*   < > 3.3*   < > 5.1 4.4  CL   < >  --  86*   < > 90*   < > 91*  --  90*  --  92*  --  93*  --   --  96*  CO2   < >  --  35*   < > 33*   < > 34*  --  30  --  33*  --  26  --   --  18*  GLUCOSE   < >  --  230*   < > 230*   < > 254*  --  251*  --  129*  --  169*  --   --  117*   BUN   < >  --  8   < > 9   < > 7  --  8  --  9  --  12  --   --  19  CREATININE   < >  --  1.17   < > 1.22   < > 1.13  --  1.17  --  1.25*  --  1.56*  --   --  1.95*  CALCIUM   < >  --  8.4*   < > 8.1*   < > 7.6*  --  7.8*  --  7.5*  --  7.3*  --   --  7.0*  MG   < >  --  2.2  --  2.8*  --   --   --  2.8*  --   --   --  2.3  --   --  2.2  PHOS  --  2.7 1.2*  --  1.1*  --   --   --  3.1  --   --   --  4.7*  --   --   --    < > = values in this interval not displayed.   Liver Function Tests: Recent Labs  Lab 09/25/19 0608 09/25/19 2054 09/26/19 0602  AST 81* 174* 145*  ALT 34 51* 46*  ALKPHOS 35* 36* 29*  BILITOT 1.7* 1.1 1.3*  PROT 6.7 5.9* 5.6*  ALBUMIN 3.2* 2.6* 2.5*   Recent Labs  Lab 09/25/19 0608  LIPASE 84*   No results for input(s): AMMONIA in the last 168 hours. CBC: Recent Labs  Lab 09/25/19 2054 09/26/19 0050 09/26/19 0602 09/27/19 0427 09/28/19 0558  WBC 6.4 7.4 9.3 9.4 9.2  NEUTROABS  --   --   --  7.8* 7.7  HGB 10.0* 9.3* 9.4* 9.4* 9.0*  HCT 28.6* 27.1* 26.9* 25.9* 25.9*  MCV 97.3 99.3 97.8 95.9 100.0  PLT 139* 127* 143* 135* 117*   Cardiac Enzymes: No results for input(s): CKTOTAL, CKMB, CKMBINDEX, TROPONINI in the last 168 hours. BNP:  Invalid input(s): POCBNP CBG: Recent Labs  Lab 09/27/19 1602 09/27/19 1926 09/27/19 2003 09/27/19 2321 09/28/19 0424  GLUCAP 86 64* 147* 107* 88       IMAGING RESULTS:  Imaging: DG Chest Port 1 View  Result Date: 09/27/2019 CLINICAL DATA:  Abnormal respiration. EXAM: PORTABLE CHEST 1 VIEW COMPARISON:  Radiograph yesterday.  CT 03/25/2019 FINDINGS: Enteric tube remains in place. Cardiomegaly appears similar. Unchanged mediastinal contours. Slight worsening in bronchial and interstitial thickening from prior exam. Suspected small pleural effusions with minimal fluid in the right minor fissure. No pneumothorax. IMPRESSION: 1. Slight worsening in pulmonary edema. Small pleural effusions. 2. Stable cardiomegaly.  Electronically Signed   By: Keith Rake M.D.   On: 09/27/2019 21:56        ASSESSMENT AND PLAN    -Multidisciplinary rounds held today   Circulatory shock - present on admission -likely cardiogenic vs possible distributive shock -currently on epinephrine drip, lidocaine drip -s/p VF with shock -continue bipap -diurese post improvement in BP -Cardiology on case - appreciate input -septic workup today - patient empirically was on Zosyn for asp PNA >>Unasyn with pharmD mgt   Acute decompensated systolic CHF  -TTE - EF 41-74%  - BNP >3000 -cardiology on case - appreciate input - Disucussed with Dr Saralyn Pilar today - Severe NICM, PAF, PVT  - pulmonary edema bilaterally on CXR  -correction of electrolytes in process - pharmacy consult  -strict I&O with fluid restriction -complicated by AF, and mod-severe MR -continue Foley Catheter-assess need daily   Acute Hypoxic Respiratory Failure -due to bilateral interstitial edema with possible aspiration event -cannot diurese at this time due to hypotension with vasopresossor support -ABG - 6/21 -BIPAP - RT - weaning as able    Alcoholism with EtOH withdrawal syndrome  - implement [place] alcohol withdrawal protocol  -CIWA  - wean off of all sedation as able -currently on Precedex  -thiamine and folate repletion without banana bag due to volume status   Severe protein calorie malnutrition    - bitemporal wasting   - albumin is low    - high likelyhood of re-feeeding syndrome    - dietary consultation    ID -continue IV abx as prescibed -follow up cultures  GI/Nutrition GI PROPHYLAXIS as indicated DIET-->TF's as tolerated Constipation protocol as indicated  ENDO - ICU hypoglycemic\Hyperglycemia protocol -check FSBS per protocol   ELECTROLYTES -follow labs as needed -replace as needed -pharmacy consultation   DVT/GI PRX ordered -SCDs  TRANSFUSIONS AS NEEDED MONITOR FSBS ASSESS the need for LABS as  needed   Critical care provider statement:    Critical care time (minutes):  33   Critical care time was exclusive of:  Separately billable procedures and treating other patients   Critical care was necessary to treat or prevent imminent or life-threatening deterioration of the following conditions:  Acute decompensated systolic chf, PAF, VF, MR, alcoholism, electrolyte derrangement, CHF, alcohol withdrawal, acute hypoxemia, multiple comorbid conditions   Critical care was time spent personally by me on the following activities:  Development of treatment plan with patient or surrogate, discussions with consultants, evaluation of patient's response to treatment, examination of patient, obtaining history from patient or surrogate, ordering and performing treatments and interventions, ordering and review of laboratory studies and re-evaluation of patient's condition.  I assumed direction of critical care for this patient from another provider in my specialty: no    This document was prepared using Dragon voice recognition software and may include unintentional dictation errors.  Ottie Glazier, M.D.  Division of [place]

## 2019-09-28 NOTE — Procedures (Signed)
Endotracheal Intubation: Patient required placement of an artificial airway secondary to Respiratory Failure  Consent: verbal consent from wife and daughter with RN Eunice Blase as witness  Hand washing performed prior to starting the procedure.   Medications administered for sedation prior to procedure:  Midazolam 2 mg IV,  Rocuronium 40 mg IV, Fentanyl 100 mcg IV.    A time out procedure was called and correct patient, name, & ID confirmed. Needed supplies and equipment were assembled and checked to include ETT, 10 ml syringe, Glidescope, Mac and Miller blades, suction, oxygen and bag mask valve, end tidal CO2 monitor.   Patient was positioned to align the mouth and pharynx to facilitate visualization of the glottis.   Heart rate, SpO2 and blood pressure was continuously monitored during the procedure. Pre-oxygenation was conducted prior to intubation and endotracheal tube was placed through the vocal cords into the trachea.     The artificial airway was placed under direct visualization via glidescope route using a 8.0 ETT on the first attempt.  ETT was secured at 24 cm mark.  Placement was confirmed by auscuitation of lungs with good breath sounds bilaterally and no stomach sounds.  Condensation was noted on endotracheal tube.   Pulse ox 98%.  CO2 detector in place with appropriate color change.   Complications: None .    Chest radiograph ordered and pending.   Comments: OGT placed via glidescope.   Ottie Glazier, M.D.  Pulmonary & [place]

## 2019-09-29 LAB — BASIC METABOLIC PANEL
Anion gap: 14 (ref 5–15)
BUN: 29 mg/dL — ABNORMAL HIGH (ref 6–20)
CO2: 29 mmol/L (ref 22–32)
Calcium: 7 mg/dL — ABNORMAL LOW (ref 8.9–10.3)
Chloride: 98 mmol/L (ref 98–111)
Creatinine, Ser: 2.21 mg/dL — ABNORMAL HIGH (ref 0.61–1.24)
GFR calc Af Amer: 36 mL/min — ABNORMAL LOW (ref >60)
GFR calc non Af Amer: 31 mL/min — ABNORMAL LOW (ref >60)
Glucose, Bld: 122 mg/dL — ABNORMAL HIGH (ref 70–99)
Potassium: 3.5 mmol/L (ref 3.5–5.1)
Sodium: 141 mmol/L (ref 135–145)

## 2019-09-29 LAB — CBC WITH DIFFERENTIAL/PLATELET
Abs Immature Granulocytes: 0.06 10*3/uL (ref 0.00–0.07)
Basophils Absolute: 0 10*3/uL (ref 0.0–0.1)
Basophils Relative: 0 %
Eosinophils Absolute: 0 10*3/uL (ref 0.0–0.5)
Eosinophils Relative: 0 %
HCT: 26.1 % — ABNORMAL LOW (ref 39.0–52.0)
Hemoglobin: 9.3 g/dL — ABNORMAL LOW (ref 13.0–17.0)
Immature Granulocytes: 1 %
Lymphocytes Relative: 14 %
Lymphs Abs: 1.1 10*3/uL (ref 0.7–4.0)
MCH: 34.8 pg — ABNORMAL HIGH (ref 26.0–34.0)
MCHC: 35.6 g/dL (ref 30.0–36.0)
MCV: 97.8 fL (ref 80.0–100.0)
Monocytes Absolute: 0.8 10*3/uL (ref 0.1–1.0)
Monocytes Relative: 10 %
Neutro Abs: 5.9 10*3/uL (ref 1.7–7.7)
Neutrophils Relative %: 75 %
Platelets: 150 10*3/uL (ref 150–400)
RBC: 2.67 MIL/uL — ABNORMAL LOW (ref 4.22–5.81)
RDW: 12.9 % (ref 11.5–15.5)
WBC: 7.9 10*3/uL (ref 4.0–10.5)
nRBC: 5.1 % — ABNORMAL HIGH (ref 0.0–0.2)

## 2019-09-29 LAB — GLUCOSE, CAPILLARY
Glucose-Capillary: 100 mg/dL — ABNORMAL HIGH (ref 70–99)
Glucose-Capillary: 106 mg/dL — ABNORMAL HIGH (ref 70–99)
Glucose-Capillary: 124 mg/dL — ABNORMAL HIGH (ref 70–99)
Glucose-Capillary: 124 mg/dL — ABNORMAL HIGH (ref 70–99)
Glucose-Capillary: 89 mg/dL (ref 70–99)
Glucose-Capillary: 91 mg/dL (ref 70–99)

## 2019-09-29 LAB — PHOSPHORUS: Phosphorus: 5.5 mg/dL — ABNORMAL HIGH (ref 2.5–4.6)

## 2019-09-29 LAB — BLOOD GAS, ARTERIAL
Acid-Base Excess: 3.2 mmol/L — ABNORMAL HIGH (ref 0.0–2.0)
Bicarbonate: 27.9 mmol/L (ref 20.0–28.0)
FIO2: 0.4
MECHVT: 500 mL
O2 Saturation: 97.3 %
PEEP: 5 cmH2O
Patient temperature: 37
RATE: 15 resp/min
pCO2 arterial: 42 mmHg (ref 32.0–48.0)
pH, Arterial: 7.43 (ref 7.350–7.450)
pO2, Arterial: 91 mmHg (ref 83.0–108.0)

## 2019-09-29 LAB — MAGNESIUM: Magnesium: 2 mg/dL (ref 1.7–2.4)

## 2019-09-29 MED ORDER — POTASSIUM CHLORIDE 10 MEQ/50ML IV SOLN
10.0000 meq | INTRAVENOUS | Status: AC
  Administered 2019-09-29 (×2): 10 meq via INTRAVENOUS
  Filled 2019-09-29 (×2): qty 50

## 2019-09-29 NOTE — Progress Notes (Signed)
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 Noted to be hypotensive tachycardic but lucid and AAOx4 per H/P. He had electrolyte derrangments with devt of AFrVR. While in MICU he went into VF received 200J defib with ROSC and was able to speak immediately after and was subsequently in sinus rythm.   Lines / Drains: R-fem-TLC PIV  Cultures / Sepsis markers: -Blood culture x2 -pending -Procal with mild elevation - 0.3   Antibiotics: Zosyn>>unasy for empiric aspiration    Protocols / Consultants: Cardio, pccm  Tests / Events: TTE   09/28/19- Goals of care discussion with wife at bedside, explained patient with worsening lactate, worsening pulm edema and overall poor prognosis remains on vasopressor support with advanced CHF and alcoholism.  Wife wishes to continue fully aggressive care.  Explained high risk for intubation and family wishes are to continue and proceed with intubation but not for prolonged periods and continue full code scope of care at this time.  6/23- patient requirement of levophed increased overnight, we are having goals of care discussion today with family.  Wife wishes to proceed with DNR and transition to comfort measures tomorow with family at bedside after terminal extubation.   PAST MEDICAL HISTORY   Past Medical History:  Diagnosis Date  . Abnormal CT scan, colon 11/27/2017  . Alcohol abuse   . Cardiomyopathy ([place])   . CHF (congestive heart failure) ([place])   . Colon  polyps   . ED (erectile dysfunction)   . Hypertension   . Hypertriglyceridemia   . Osteoarthritis   . Osteoarthritis      SURGICAL HISTORY   Past Surgical History:  Procedure Laterality Date  . COLONOSCOPY    . COLONOSCOPY WITH PROPOFOL N/A 02/02/2018   Procedure: COLONOSCOPY WITH PROPOFOL;  Surgeon: Manya Silvas, MD;  Location: [facility] ENDOSCOPY;  Service: Endoscopy;  Laterality: N/A;  . LOWER EXTREMITY ANGIOGRAPHY Right 06/21/2019   Procedure: LOWER EXTREMITY ANGIOGRAPHY;  Surgeon: Algernon Huxley, MD;  Location: [facility] CV LAB;  Service: Cardiovascular;  Laterality: Right;     FAMILY HISTORY   Family History  Problem Relation Age of Onset  . Heart failure Mother   . Renal Disease Mother   . Bone cancer Father      SOCIAL HISTORY   Social History   Tobacco Use  . Smoking status: Current Every Day Smoker    Packs/day: 1.00    Years: 40.00    Pack years: 40.00    Types: Cigarettes  . Smokeless tobacco: Never Used  Vaping Use  . Vaping Use: Never used  Substance Use Topics  . Alcohol use: Yes    Comment: 1/4 pint of vodka daily  . Drug use: Yes    Types: Marijuana     MEDICATIONS   Current Medication:  Current Facility-Administered Medications:  .  0.9 %  sodium chloride infusion, 250 mL, Intravenous, Continuous, Hinda Kehr, MD, Last Rate: 10 mL/hr at 09/25/19 1600, Rate Verify at 09/25/19 1600 .  0.9 %  sodium chloride infusion, 250 mL, Intravenous, Continuous, Tyler Pita, MD, Stopped at 09/25/19 1211 .  albumin human 25 % solution 12.5 g, 12.5  g, Intravenous, Daily, Ottie Glazier, MD, Last Rate: 60 mL/hr at 09/29/19 0914, 12.5 g at 09/29/19 0914 .  Ampicillin-Sulbactam (UNASYN) 3 g in sodium chloride 0.9 % 100 mL IVPB, 3 g, Intravenous, Q6H, Tyler Pita, MD, Last Rate: 200 mL/hr at 09/29/19 0823, 3 g at 09/29/19 0823 .  chlorhexidine gluconate (MEDLINE KIT) (PERIDEX) 0.12 % solution 15 mL, 15 mL, Mouth Rinse, BID, Lanney Gins, Tkai Large,  MD, 15 mL at 09/29/19 0827 .  Chlorhexidine Gluconate Cloth 2 % PADS 6 each, 6 each, Topical, Q0600, Tyler Pita, MD, 6 each at 09/29/19 0208 .  dexmedetomidine (PRECEDEX) 400 MCG/100ML (4 mcg/mL) infusion, 0.4-1.2 mcg/kg/hr, Intravenous, Titrated, Ottie Glazier, MD, Stopped at 09/28/19 1708 .  docusate sodium (COLACE) capsule 100 mg, 100 mg, Oral, BID PRN, Tyler Pita, MD .  enoxaparin (LOVENOX) injection 40 mg, 40 mg, Subcutaneous, Q24H, Tyler Pita, MD, 40 mg at 09/29/19 0915 .  fentaNYL 2565mg in NS 2566m(102mml) infusion-PREMIX, 0-400 mcg/hr, Intravenous, Continuous, Michelyn Scullin, MD, Last Rate: 20 mL/hr at 09/29/19 0824, 200 mcg/hr at 09/29/19 0824 .  insulin aspart (novoLOG) injection 0-6 Units, 0-6 Units, Subcutaneous, Q4H, Tukov-Yual, Magdalene S, NP, 2 Units at 09/26/19 0809 .  LORazepam (ATIVAN) injection 1-2 mg, 1-2 mg, Intravenous, Q1H PRN, GonTyler PitaD, 2 mg at 09/27/19 1201 .  MEDLINE mouth rinse, 15 mL, Mouth Rinse, BID, GonTyler PitaD, 15 mL at 09/29/19 0947 .  MEDLINE mouth rinse, 15 mL, Mouth Rinse, 10 times per day, AleOttie GlazierD, 15 mL at 09/29/19 0947 .  nicotine (NICODERM CQ - dosed in mg/24 hours) patch 21 mg, 21 mg, Transdermal, Daily, GonTyler PitaD, 21 mg at 09/29/19 0917 .  norepinephrine (LEVOPHED) 16 mg in 250m63memix infusion, 0-40 mcg/min, Intravenous, Titrated, Tukov-Yual, Magdalene S, NP, Last Rate: 9.38 mL/hr at 09/29/19 0604, 10 mcg/min at 09/29/19 0604 .  ondansetron (ZOFRAN) injection 4 mg, 4 mg, Intravenous, Q6H PRN, GonzTyler Pita .  pantoprazole (PROTONIX) injection 40 mg, 40 mg, Intravenous, Q12H, Tukov-Yual, Magdalene S, NP    ALLERGIES   Patient has no known allergies.    REVIEW OF SYSTEMS    Patient confused on sedation while in alcohol withdrawal  PHYSICAL EXAMINATION   Vital Signs: Temp:  [97.9 F (36.6 C)-98.4 F (36.9 C)] 98.1 F (36.7 C) (06/23 0729) Pulse Rate:   [69-89] 71 (06/23 0729) Resp:  [15-32] 15 (06/23 0729) BP: (71-118)/(59-89) 118/81 (06/23 0729) SpO2:  [95 %-99 %] 97 % (06/23 0749) FiO2 (%):  [35 %-40 %] 40 % (06/23 0749) Weight:  [75.6 kg] 75.6 kg (06/23 0210)  GENERAL:NAD, confusion+ HEAD: Normocephalic, atraumatic.  EYES: Pupils equal, round, reactive to light.  No scleral icterus.  MOUTH: Moist mucosal membrane. NECK: Supple. No thyromegaly. No nodules. No JVD.  PULMONARY: bilateral inspiratory and expiratory crackles CARDIOVASCULAR: S1 and S2. Regular rate and rhythm. No murmurs, rubs, or gallops.  GASTROINTESTINAL: Soft, nontender, non-distended. No masses. Positive bowel sounds. No hepatosplenomegaly.  MUSCULOSKELETAL: No swelling, clubbing, or edema.  NEUROLOGIC:GCS10 on sedation SKIN:intact,warm,dry   PERTINENT DATA     Infusions: . sodium chloride 10 mL/hr at 09/25/19 1600  . sodium chloride Stopped (09/25/19 1211)  . albumin human 12.5 g (09/29/19 0914)  . ampicillin-sulbactam (UNASYN) IV 3 g (09/29/19 0823)  . dexmedetomidine (PRECEDEX) IV infusion Stopped (09/28/19 1708)  . fentaNYL infusion INTRAVENOUS 200 mcg/hr (09/29/19 0824)  . norepinephrine (LEVOPHED) Adult infusion 10 mcg/min (09/29/19 0604)   Scheduled Medications: .  chlorhexidine gluconate (MEDLINE KIT)  15 mL Mouth Rinse BID  . Chlorhexidine Gluconate Cloth  6 each Topical Q0600  . enoxaparin (LOVENOX) injection  40 mg Subcutaneous Q24H  . insulin aspart  0-6 Units Subcutaneous Q4H  . mouth rinse  15 mL Mouth Rinse BID  . mouth rinse  15 mL Mouth Rinse 10 times per day  . nicotine  21 mg Transdermal Daily  . pantoprazole  40 mg Intravenous Q12H   PRN Medications: docusate sodium, LORazepam, ondansetron (ZOFRAN) IV Hemodynamic parameters:   Intake/Output: 06/22 0701 - 06/23 0700 In: 928.4 [I.V.:500.3; IV Piggyback:428.1] Out: 725 [Urine:525; Emesis/NG output:200]  Ventilator  Settings: Vent Mode: PRVC FiO2 (%):  [35 %-40 %] 40 % Set  Rate:  [15 bmp] 15 bmp Vt Set:  [500 mL] 500 mL PEEP:  [5 cmH20] 5 cmH20 Plateau Pressure:  [15 cmH20-18 cmH20] 17 cmH20   LAB RESULTS:  Basic Metabolic Panel: Recent Labs  Lab 09/25/19 1746 09/25/19 1746 09/25/19 2054 09/26/19 0050 09/26/19 0602 09/26/19 0602 09/26/19 1757 09/26/19 1757 09/27/19 0427 09/27/19 1445 09/28/19 0426 09/29/19 0423  NA 133*   < > 134*   < > 134*  --  136  --  135  --  136 141  K 2.2*   < > <2.0*   < > 2.8*   < > 2.7*   < > 3.3*   < > 4.4 3.5  CL 86*   < > 90*   < > 90*  --  92*  --  93*  --  96* 98  CO2 35*   < > 33*   < > 30  --  33*  --  26  --  18* 29  GLUCOSE 230*   < > 230*   < > 251*  --  129*  --  169*  --  117* 122*  BUN 8   < > 9   < > 8  --  9  --  12  --  19 29*  CREATININE 1.17   < > 1.22   < > 1.17  --  1.25*  --  1.56*  --  1.95* 2.21*  CALCIUM 8.4*   < > 8.1*   < > 7.8*  --  7.5*  --  7.3*  --  7.0* 7.0*  MG 2.2   < > 2.8*  --  2.8*  --   --   --  2.3  --  2.2 2.0  PHOS 1.2*  --  1.1*  --  3.1  --   --   --  4.7*  --   --  5.5*   < > = values in this interval not displayed.   Liver Function Tests: Recent Labs  Lab 09/25/19 0608 09/25/19 2054 09/26/19 0602  AST 81* 174* 145*  ALT 34 51* 46*  ALKPHOS 35* 36* 29*  BILITOT 1.7* 1.1 1.3*  PROT 6.7 5.9* 5.6*  ALBUMIN 3.2* 2.6* 2.5*   Recent Labs  Lab 09/25/19 0608  LIPASE 84*   No results for input(s): AMMONIA in the last 168 hours. CBC: Recent Labs  Lab 09/26/19 0050 09/26/19 0602 09/27/19 0427 09/28/19 0558 09/29/19 0423  WBC 7.4 9.3 9.4 9.2 7.9  NEUTROABS  --   --  7.8* 7.7 5.9  HGB 9.3* 9.4* 9.4* 9.0* 9.3*  HCT 27.1* 26.9* 25.9* 25.9* 26.1*  MCV 99.3 97.8 95.9 100.0 97.8  PLT 127* 143* 135* 117* 150   Cardiac Enzymes: No results  for input(s): CKTOTAL, CKMB, CKMBINDEX, TROPONINI in the last 168 hours. BNP: Invalid input(s): POCBNP CBG: Recent Labs  Lab 09/28/19 0424 09/28/19 1949 09/29/19 0007 09/29/19 0348 09/29/19 0712  GLUCAP 88 129* 124* 124*  91       IMAGING RESULTS:  Imaging: DG Chest Port 1 View  Result Date: 09/28/2019 CLINICAL DATA:  Intubation.  CHF EXAM: PORTABLE CHEST 1 VIEW COMPARISON:  09/27/2019 FINDINGS: Endotracheal tube has been placed in the interval and is in good position. NG tube in the stomach with the tip not visualized. Diffuse bilateral airspace disease appears unchanged. Bibasilar atelectasis also unchanged. No effusion. IMPRESSION: Endotracheal tube in good position Diffuse bilateral airspace disease unchanged, most likely pulmonary edema versus pneumonia. Electronically Signed   By: Franchot Gallo M.D.   On: 09/28/2019 16:52   DG Chest Port 1 View  Result Date: 09/27/2019 CLINICAL DATA:  Abnormal respiration. EXAM: PORTABLE CHEST 1 VIEW COMPARISON:  Radiograph yesterday.  CT 03/25/2019 FINDINGS: Enteric tube remains in place. Cardiomegaly appears similar. Unchanged mediastinal contours. Slight worsening in bronchial and interstitial thickening from prior exam. Suspected small pleural effusions with minimal fluid in the right minor fissure. No pneumothorax. IMPRESSION: 1. Slight worsening in pulmonary edema. Small pleural effusions. 2. Stable cardiomegaly. Electronically Signed   By: Keith Rake M.D.   On: 09/27/2019 21:56        ASSESSMENT AND PLAN    -Multidisciplinary rounds held today   Circulatory shock - present on admission -likely cardiogenic vs possible distributive shock -currently on epinephrine drip, lidocaine drip -s/p VF with shock -continue bipap -diurese post improvement in BP -Cardiology on case - appreciate input -septic workup today - patient empirically was on Zosyn for asp PNA >>Unasyn with pharmD mgt   Acute decompensated systolic CHF  -TTE - EF 77-93%  - BNP >3000 -cardiology on case - appreciate input - Disucussed with Dr Saralyn Pilar today - Severe NICM, PAF, PVT  - pulmonary edema bilaterally on CXR  -correction of electrolytes in process - pharmacy consult   -strict I&O with fluid restriction -complicated by AF, and mod-severe MR -continue Foley Catheter-assess need daily   Acute Hypoxic Respiratory Failure -due to bilateral interstitial edema with possible aspiration event -cannot diurese at this time due to hypotension with vasopresossor support -ABG - 6/21 -BIPAP - RT - weaning as able    Alcoholism with EtOH withdrawal syndrome  - implement [place] alcohol withdrawal protocol  -CIWA  - wean off of all sedation as able -currently on Precedex  -thiamine and folate repletion without banana bag due to volume status   Severe protein calorie malnutrition    - bitemporal wasting   - albumin is low    - high likelyhood of re-feeeding syndrome    - dietary consultation    ID -continue IV abx as prescibed -follow up cultures  GI/Nutrition GI PROPHYLAXIS as indicated DIET-->TF's as tolerated Constipation protocol as indicated  ENDO - ICU hypoglycemic\Hyperglycemia protocol -check FSBS per protocol   ELECTROLYTES -follow labs as needed -replace as needed -pharmacy consultation   DVT/GI PRX ordered -SCDs  TRANSFUSIONS AS NEEDED MONITOR FSBS ASSESS the need for LABS as needed   Critical care provider statement:    Critical care time (minutes):  33   Critical care time was exclusive of:  Separately billable procedures and treating other patients   Critical care was necessary to treat or prevent imminent or life-threatening deterioration of the following conditions:  Acute decompensated systolic chf, PAF, VF, MR, alcoholism, electrolyte  derrangement, CHF, alcohol withdrawal, acute hypoxemia, multiple comorbid conditions   Critical care was time spent personally by me on the following activities:  Development of treatment plan with patient or surrogate, discussions with consultants, evaluation of patient's response to treatment, examination of patient, obtaining history from patient or surrogate, ordering and performing  treatments and interventions, ordering and review of laboratory studies and re-evaluation of patient's condition.  I assumed direction of critical care for this patient from another provider in my specialty: no    This document was prepared using Dragon voice recognition software and may include unintentional dictation errors.    Ottie Glazier, M.D.  Division of [place]

## 2019-09-29 NOTE — Progress Notes (Signed)
[place] for Electrolyte Monitoring and Replacement   Recent Labs: Potassium (mmol/L)  Date Value  09/29/2019 3.5   Magnesium (mg/dL)  Date Value  09/29/2019 2.0   Calcium (mg/dL)  Date Value  09/29/2019 7.0 (L)   Albumin (g/dL)  Date Value  09/26/2019 2.5 (L)   Phosphorus (mg/dL)  Date Value  09/29/2019 5.5 (H)   Sodium (mmol/L)  Date Value  09/29/2019 141     Assessment: 59 year old male with weakness, emesis. Patient with significant electrolyte abnormalities requiring replacement for several days. Patient now off lidocaine drip 6/22.  Goal of Therapy:  Electrolytes WNL, K ~ 4 and Mg ~ 2 in setting of afib  Plan:  Electrolytes WNL today. Potassium has trended down but WNL. Renal function continues to worsen. Will give potassium 10 mEq IV x 2 in setting of cardiac issues but with renal issues. Follow up with morning labs.  Tawnya Crook ,PharmD Clinical Pharmacist 09/29/2019 11:58 AM

## 2019-09-29 NOTE — Progress Notes (Signed)
[facility] Cardiology    SUBJECTIVE: The patient is unable to answer ROS as he is currently sedated, intubated on mechanical ventilation.   Vitals:   09/29/19 0600 09/29/19 0729 09/29/19 0749 09/29/19 1000  BP: 104/78 118/81  109/81  Pulse: 69 71  72  Resp: 15 15  15   Temp:  98.1 F (36.7 C)  98.2 F (36.8 C)  TempSrc:  Axillary  Axillary  SpO2: 98% 98% 97% 98%  Weight:      Height:         Intake/Output Summary (Last 24 hours) at 09/29/2019 1303 Last data filed at 09/29/2019 0604 Gross per 24 hour  Intake 928.4 ml  Output 625 ml  Net 303.4 ml      PHYSICAL EXAM  General: Critically ill appearing, lying in bed, intubated, on mechanical ventilator, with multiple IV drips, in no acute distress HEENT:  Normocephalic and atramatic Neck:  No JVD.  Lungs: mechanical vent support Heart: HRRR . Normal S1 and S2 without gallops or murmurs.  Abdomen: nondistended Msk:  No obvious deformities Extremities: Trace bilateral lower extremity edema. Cold extremities   Neuro: sedated   LABS: Basic Metabolic Panel: Recent Labs    09/27/19 0427 09/27/19 1445 09/28/19 0426 09/29/19 0423  NA 135  --  136 141  K 3.3*   < > 4.4 3.5  CL 93*  --  96* 98  CO2 26  --  18* 29  GLUCOSE 169*  --  117* 122*  BUN 12  --  19 29*  CREATININE 1.56*  --  1.95* 2.21*  CALCIUM 7.3*  --  7.0* 7.0*  MG 2.3  --  2.2 2.0  PHOS 4.7*  --   --  5.5*   < > = values in this interval not displayed.   Liver Function Tests: No results for input(s): AST, ALT, ALKPHOS, BILITOT, PROT, ALBUMIN in the last 72 hours. No results for input(s): LIPASE, AMYLASE in the last 72 hours. CBC: Recent Labs    09/28/19 0558 09/29/19 0423  WBC 9.2 7.9  NEUTROABS 7.7 5.9  HGB 9.0* 9.3*  HCT 25.9* 26.1*  MCV 100.0 97.8  PLT 117* 150   Cardiac Enzymes: No results for input(s): CKTOTAL, CKMB, CKMBINDEX, TROPONINI in the last 72 hours. BNP: Invalid input(s): POCBNP D-Dimer: No results for input(s): DDIMER in the  last 72 hours. Hemoglobin A1C: No results for input(s): HGBA1C in the last 72 hours. Fasting Lipid Panel: No results for input(s): CHOL, HDL, LDLCALC, TRIG, CHOLHDL, LDLDIRECT in the last 72 hours. Thyroid Function Tests: No results for input(s): TSH, T4TOTAL, T3FREE, THYROIDAB in the last 72 hours.  Invalid input(s): FREET3 Anemia Panel: No results for input(s): VITAMINB12, FOLATE, FERRITIN, TIBC, IRON, RETICCTPCT in the last 72 hours.  DG Chest Port 1 View  Result Date: 09/28/2019 CLINICAL DATA:  Intubation.  CHF EXAM: PORTABLE CHEST 1 VIEW COMPARISON:  09/27/2019 FINDINGS: Endotracheal tube has been placed in the interval and is in good position. NG tube in the stomach with the tip not visualized. Diffuse bilateral airspace disease appears unchanged. Bibasilar atelectasis also unchanged. No effusion. IMPRESSION: Endotracheal tube in good position Diffuse bilateral airspace disease unchanged, most likely pulmonary edema versus pneumonia. Electronically Signed   By: Franchot Gallo M.D.   On: 09/28/2019 16:52   DG Chest Port 1 View  Result Date: 09/27/2019 CLINICAL DATA:  Abnormal respiration. EXAM: PORTABLE CHEST 1 VIEW COMPARISON:  Radiograph yesterday.  CT 03/25/2019 FINDINGS: Enteric tube remains in place. Cardiomegaly appears  similar. Unchanged mediastinal contours. Slight worsening in bronchial and interstitial thickening from prior exam. Suspected small pleural effusions with minimal fluid in the right minor fissure. No pneumothorax. IMPRESSION: 1. Slight worsening in pulmonary edema. Small pleural effusions. 2. Stable cardiomegaly. Electronically Signed   By: Keith Rake M.D.   On: 09/27/2019 21:56     Echo LVEF 20 to 25% with severe dilated cardiomyopathy  TELEMETRY: sinus rhythm  ASSESSMENT AND PLAN:  Active Problems:   Alcohol abuse   Ventricular tachyarrhythmia (HCC)   Acute respiratory failure (HCC)   Atrial fibrillation with RVR (HCC)   1. Polymorphic VT, while on  amiodarone with QT prolongation, exacerbated by severe electrolyte abnormalities, switched to lidocaine, which has since been discontinued when hypokalemia resolved, currently in sinus rhythm.  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 Unable to place on BB, Entresto or diuretic due to hypotension. The family wishes to extubate and proceed with comfort care measures. Pursuing advanced heart failure management has been considered. However, with ongoing alcohol abuse, these measures will likely be of minimal benefit. 5.Hypokalemia, in the setting of chronic EtOH abuse, received multiple doses of IV potassium; resolved; K currently 3.5 6.Hypomagnesemia, resolved 7.EtOH abuse, ongoing 8.Hypotension, multifactorial, secondary to above, on Levophed  Recommendations: 1. Defer advanced heart failure management due to the patient's ongoing alcohol abuse.  2. Agree with comfort care measures.  Clabe Seal, PA-C 09/29/2019 1:03 PM

## 2019-09-29 NOTE — Progress Notes (Signed)
   09/28/19 2120  Clinical Encounter Type  Visited With Patient  Visit Type Follow-up  Referral From Chaplain  Consult/Referral To [place]  [place] visited pt who was asleep upon arrival. This is a follow up visit from earlier today. No family was present. [place] prayed silently for pt bedside.

## 2019-09-29 NOTE — Progress Notes (Signed)
[place] visited pt.'s rm. while rounding on ICU and as follow-up from prior chaplain support.  Pt. intubated and sedated; pt.'s dtr. and brother-in-law at bedside.  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  Dtr. seemed somewhat dazed and in shock.  [place] unable to fully engage her in conversation due to brother-in-law's desire to visit, but [place] recommends follow-up w/family.  Per discussions w/RN plan is to move to comfort measures tomorrow potentially.  [place] visit ended by page to ED.    09/29/19 1550  Clinical Encounter Type  Visited With Patient and family together  Visit Type Follow-up;Psychological support;Social support;Critical Care  Referral From Other (Comment) (Routine Rounding)  Consult/Referral To Chaplain  Spiritual Encounters  Spiritual Needs Emotional  Stress Factors  Family Stress Factors Lack of knowledge;Health changes;Exhausted;Loss of control;Major life changes;Loss

## 2019-09-30 LAB — CBC WITH DIFFERENTIAL/PLATELET
Abs Immature Granulocytes: 0.08 10*3/uL — ABNORMAL HIGH (ref 0.00–0.07)
Basophils Absolute: 0.1 10*3/uL (ref 0.0–0.1)
Basophils Relative: 1 %
Eosinophils Absolute: 0 10*3/uL (ref 0.0–0.5)
Eosinophils Relative: 0 %
HCT: 26.8 % — ABNORMAL LOW (ref 39.0–52.0)
Hemoglobin: 9.5 g/dL — ABNORMAL LOW (ref 13.0–17.0)
Immature Granulocytes: 1 %
Lymphocytes Relative: 18 %
Lymphs Abs: 1 10*3/uL (ref 0.7–4.0)
MCH: 34.3 pg — ABNORMAL HIGH (ref 26.0–34.0)
MCHC: 35.4 g/dL (ref 30.0–36.0)
MCV: 96.8 fL (ref 80.0–100.0)
Monocytes Absolute: 0.8 10*3/uL (ref 0.1–1.0)
Monocytes Relative: 13 %
Neutro Abs: 3.9 10*3/uL (ref 1.7–7.7)
Neutrophils Relative %: 67 %
Platelets: 142 10*3/uL — ABNORMAL LOW (ref 150–400)
RBC: 2.77 MIL/uL — ABNORMAL LOW (ref 4.22–5.81)
RDW: 12.7 % (ref 11.5–15.5)
Smear Review: NORMAL
WBC: 5.9 10*3/uL (ref 4.0–10.5)
nRBC: 6 % — ABNORMAL HIGH (ref 0.0–0.2)

## 2019-09-30 LAB — HEPATIC FUNCTION PANEL
ALT: 975 U/L — ABNORMAL HIGH (ref 0–44)
AST: 3019 U/L — ABNORMAL HIGH (ref 15–41)
Albumin: 2.1 g/dL — ABNORMAL LOW (ref 3.5–5.0)
Alkaline Phosphatase: 32 U/L — ABNORMAL LOW (ref 38–126)
Bilirubin, Direct: 1.9 mg/dL — ABNORMAL HIGH (ref 0.0–0.2)
Indirect Bilirubin: 1.6 mg/dL — ABNORMAL HIGH (ref 0.3–0.9)
Total Bilirubin: 3.5 mg/dL — ABNORMAL HIGH (ref 0.3–1.2)
Total Protein: 5 g/dL — ABNORMAL LOW (ref 6.5–8.1)

## 2019-09-30 LAB — BASIC METABOLIC PANEL
Anion gap: 12 (ref 5–15)
BUN: 33 mg/dL — ABNORMAL HIGH (ref 6–20)
CO2: 28 mmol/L (ref 22–32)
Calcium: 6.8 mg/dL — ABNORMAL LOW (ref 8.9–10.3)
Chloride: 103 mmol/L (ref 98–111)
Creatinine, Ser: 1.54 mg/dL — ABNORMAL HIGH (ref 0.61–1.24)
GFR calc Af Amer: 56 mL/min — ABNORMAL LOW (ref >60)
GFR calc non Af Amer: 48 mL/min — ABNORMAL LOW (ref >60)
Glucose, Bld: 124 mg/dL — ABNORMAL HIGH (ref 70–99)
Potassium: 3 mmol/L — ABNORMAL LOW (ref 3.5–5.1)
Sodium: 143 mmol/L (ref 135–145)

## 2019-09-30 LAB — MAGNESIUM: Magnesium: 1.9 mg/dL (ref 1.7–2.4)

## 2019-09-30 LAB — GLUCOSE, CAPILLARY
Glucose-Capillary: 124 mg/dL — ABNORMAL HIGH (ref 70–99)
Glucose-Capillary: 125 mg/dL — ABNORMAL HIGH (ref 70–99)
Glucose-Capillary: 94 mg/dL (ref 70–99)

## 2019-09-30 LAB — PHOSPHORUS: Phosphorus: 3 mg/dL (ref 2.5–4.6)

## 2019-09-30 MED ORDER — LORAZEPAM 2 MG/ML IJ SOLN: 1.0000 mg | INTRAMUSCULAR | Status: DC

## 2019-09-30 MED ORDER — MORPHINE 100MG IN NS 100ML (1MG/ML) PREMIX INFUSION
1.0000 mg/h | INTRAVENOUS | Status: DC
  Administered 2019-09-30: 4 mg/h via INTRAVENOUS
  Filled 2019-09-30: qty 100

## 2019-09-30 MED ORDER — POTASSIUM CHLORIDE 10 MEQ/100ML IV SOLN
10.0000 meq | INTRAVENOUS | Status: AC
  Administered 2019-09-30 (×4): 10 meq via INTRAVENOUS
  Filled 2019-09-30 (×4): qty 100

## 2019-09-30 MED ORDER — ACETAMINOPHEN 160 MG/5ML PO SOLN
650.0000 mg | Freq: Once | ORAL | Status: AC
  Administered 2019-09-30: 650 mg
  Filled 2019-09-30: qty 20.3

## 2019-09-30 MED ORDER — MAGNESIUM SULFATE 2 GM/50ML IV SOLN
2.0000 g | Freq: Once | INTRAVENOUS | Status: AC
  Administered 2019-09-30: 2 g via INTRAVENOUS
  Filled 2019-09-30: qty 50

## 2019-09-30 NOTE — Progress Notes (Signed)
Nutrition Brief Note  Chart reviewed and patient discussed on rounds. Patient now transitioning to comfort care.   No nutrition interventions warranted at this time. Please re-consult RD as needed.   Jacklynn Barnacle, MS, RD, LDN Pager number available on Amion

## 2019-09-30 NOTE — Progress Notes (Signed)
°   09/30/19 1200  Clinical Encounter Type  Visited With Family  Visit Type Initial  Referral From Chaplain  Consult/Referral To Chaplain  While listening in rounds, chaplain heard patient wanted ice cream that staff though was not a good idea. Nurse gave patient ice cream very slowly and was told by wife that he enjoyed it.  While walking in the hall, chaplain notice patient's family sitting in the waiting room. As chaplain was talking to wife, daughter, and brother-in-law other family members were coming in. Family was excited that patient was talking and was not on ventilator. Brother in law told someone that patient took himself off of the ventilator as if that was a goo thing. Brother-in-law talked about what God had done. Chaplain listened but did not say anything.   Brother-in-law spoke to three chaplains a few minutes later about hoe he perceived patient is doing. Chaplain Srinika Delone asked brother-in-law if he was the preacher and he said yes.

## 2019-09-30 NOTE — Progress Notes (Signed)
RT called to assess patient after he self-extubated. Patient placed on nasal cannula at 4 Liters with saturations of 98% and tolerating well.

## 2019-09-30 NOTE — Progress Notes (Signed)
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 Noted to be hypotensive tachycardic but lucid and AAOx4 per H/P. He had electrolyte derrangments with devt of AFrVR. While in MICU he went into VF received 200J defib with ROSC and was able to speak immediately after and was subsequently in sinus rythm.   Lines / Drains: R-fem-TLC PIV  Cultures / Sepsis markers: -Blood culture x2 -pending -Procal with mild elevation - 0.3   Antibiotics: Zosyn>>unasy for empiric aspiration    Protocols / Consultants: Cardio, pccm  Tests / Events: TTE   09/28/19- Goals of care discussion with wife at bedside, explained patient with worsening lactate, worsening pulm edema and overall poor prognosis remains on vasopressor support with advanced CHF and alcoholism.  Wife wishes to continue fully aggressive care.  Explained high risk for intubation and family wishes are to continue and proceed with intubation but not for prolonged periods and continue full code scope of care at this time.  6/23- patient requirement of levophed increased overnight, we are having goals of care discussion today with family.  Wife wishes to proceed with DNR and transition to comfort measures tomorow with family at bedside after terminal extubation.  6/24- patient self extubated today. He is speaking but slow and labored.  I discussed with family and they came to hospital to be with him. He is on comfort care measures.    PAST MEDICAL HISTORY   Past  Medical History:  Diagnosis Date  . Abnormal CT scan, colon 11/27/2017  . Alcohol abuse   . Cardiomyopathy ([place])   . CHF (congestive heart failure) ([place])   . Colon polyps   . ED (erectile dysfunction)   . Hypertension   . Hypertriglyceridemia   . Osteoarthritis   . Osteoarthritis      SURGICAL HISTORY   Past Surgical History:  Procedure Laterality Date  . COLONOSCOPY    . COLONOSCOPY WITH PROPOFOL N/A 02/02/2018   Procedure: COLONOSCOPY WITH PROPOFOL;  Surgeon: Manya Silvas, MD;  Location[facility] ENDOSCOPY;  Service: Endoscopy;  Laterality: N/A;  . LOWER EXTREMITY ANGIOGRAPHY Right 06/21/2019   Procedure: LOWER EXTREMITY ANGIOGRAPHY;  Surgeon: Algernon Huxley, MD;  Location: [facility];  Service: Cardiovascular;  Laterality: Right;     FAMILY HISTORY   Family History  Problem Relation Age of Onset  . Heart failure Mother   . Renal Disease Mother   . Bone cancer Father      SOCIAL HISTORY   Social History   Tobacco Use  . Smoking status: Current Every Day Smoker    Packs/day: 1.00    Years: 40.00    Pack years: 40.00    Types: Cigarettes  . Smokeless tobacco: Never Used  Vaping Use  . Vaping Use: Never used  Substance Use Topics  . Alcohol use: Yes    Comment: 1/4 pint of vodka daily  . Drug use: Yes    Types: Marijuana     MEDICATIONS   Current Medication:  Current Facility-Administered Medications:  .  0.9 %  sodium chloride infusion, 250 mL, Intravenous, Continuous, Hinda Kehr, MD, Last Rate: 10 mL/hr at 09/25/19 1600, Rate  Verify at 09/25/19 1600 .  0.9 %  sodium chloride infusion, 250 mL, Intravenous, Continuous, Tyler Pita, MD, Stopped at 09/25/19 1211 .  albumin human 25 % solution 12.5 g, 12.5 g, Intravenous, Daily, Ottie Glazier, MD, Last Rate: 60 mL/hr at 09/29/19 0914, 12.5 g at 09/29/19 0914 .  Ampicillin-Sulbactam (UNASYN) 3 g in sodium chloride 0.9 % 100 mL IVPB, 3 g, Intravenous, Q6H, Tyler Pita, MD, Stopped  at 09/30/19 0210 .  chlorhexidine gluconate (MEDLINE KIT) (PERIDEX) 0.12 % solution 15 mL, 15 mL, Mouth Rinse, BID, Lanney Gins, Trestan Vahle, MD, 15 mL at 09/29/19 2000 .  Chlorhexidine Gluconate Cloth 2 % PADS 6 each, 6 each, Topical, Q0600, Tyler Pita, MD, 6 each at 09/30/19 [phone] .  dexmedetomidine (PRECEDEX) 400 MCG/100ML (4 mcg/mL) infusion, 0.4-1.2 mcg/kg/hr, Intravenous, Titrated, Aymar Whitfill, MD, Last Rate: 9.25 mL/hr at 09/30/19 0649, 0.5 mcg/kg/hr at 09/30/19 0649 .  docusate sodium (COLACE) capsule 100 mg, 100 mg, Oral, BID PRN, Tyler Pita, MD .  enoxaparin (LOVENOX) injection 40 mg, 40 mg, Subcutaneous, Q24H, Tyler Pita, MD, 40 mg at 09/29/19 0915 .  fentaNYL 2559mg in NS 2538m(1067mml) infusion-PREMIX, 0-400 mcg/hr, Intravenous, Continuous, Nasim Garofano, MD, Last Rate: 2.5 mL/hr at 09/30/19 0649, 25 mcg/hr at 09/30/19 0649 .  insulin aspart (novoLOG) injection 0-6 Units, 0-6 Units, Subcutaneous, Q4H, Tukov-Yual, Magdalene S, NP, 2 Units at 09/26/19 0809 .  LORazepam (ATIVAN) injection 1-2 mg, 1-2 mg, Intravenous, Q1H PRN, GonTyler PitaD, 2 mg at 09/27/19 1201 .  MEDLINE mouth rinse, 15 mL, Mouth Rinse, BID, GonTyler PitaD, 15 mL at 09/29/19 0947 .  MEDLINE mouth rinse, 15 mL, Mouth Rinse, 10 times per day, AleOttie GlazierD, 15 mL at 09/30/19 0608 .  nicotine (NICODERM CQ - dosed in mg/24 hours) patch 21 mg, 21 mg, Transdermal, Daily, GonTyler PitaD, 21 mg at 09/29/19 0917 .  norepinephrine (LEVOPHED) 16 mg in 250m62memix infusion, 0-40 mcg/min, Intravenous, Titrated, Tukov-Yual, Magdalene S, NP, Stopped at 09/30/19 0203 .  ondansetron (ZOFRAN) injection 4 mg, 4 mg, Intravenous, Q6H PRN, GonzTyler Pita .  pantoprazole (PROTONIX) injection 40 mg, 40 mg, Intravenous, Q12H, Tukov-Yual, Magdalene S, NP, 40 mg at 09/29/19 2132    ALLERGIES   Patient has no known allergies.    REVIEW OF SYSTEMS    Patient confused on  sedation while in alcohol withdrawal  PHYSICAL EXAMINATION   Vital Signs: Temp:  [99.1 F (37.3 C)-103 F (39.4 C)] 99.1 F (37.3 C) (06/24 0745) Pulse Rate:  [58-74] 58 (06/24 0650) Resp:  [15] 15 (06/24 0400) BP: (101-116)/(72-83) 101/72 (06/24 0745) SpO2:  [95 %-100 %] 96 % (06/24 0829) FiO2 (%):  [40 %] 40 % (06/24 0829) Weight:  [77.8 kg] 77.8 kg (06/24 0423)  GENERAL:NAD, confusion+ HEAD: Normocephalic, atraumatic.  EYES: Pupils equal, round, reactive to light.  No scleral icterus.  MOUTH: Moist mucosal membrane. NECK: Supple. No thyromegaly. No nodules. No JVD.  PULMONARY: bilateral inspiratory and expiratory crackles CARDIOVASCULAR: S1 and S2. Regular rate and rhythm. No murmurs, rubs, or gallops.  GASTROINTESTINAL: Soft, nontender, non-distended. No masses. Positive bowel sounds. No hepatosplenomegaly.  MUSCULOSKELETAL: No swelling, clubbing, or edema.  NEUROLOGIC:GCS10 on sedation SKIN:intact,warm,dry   PERTINENT DATA     Infusions: . sodium chloride 10 mL/hr at 09/25/19 1600  . sodium chloride Stopped (09/25/19 1211)  . albumin human 12.5 g (09/29/19 0914)  . ampicillin-sulbactam (UNASYN) IV Stopped (09/30/19 0210)  . dexmedetomidine (PRECEDEX)  IV infusion 0.5 mcg/kg/hr (09/30/19 0649)  . fentaNYL infusion INTRAVENOUS 25 mcg/hr (09/30/19 0649)  . norepinephrine (LEVOPHED) Adult infusion Stopped (09/30/19 0203)   Scheduled Medications: . chlorhexidine gluconate (MEDLINE KIT)  15 mL Mouth Rinse BID  . Chlorhexidine Gluconate Cloth  6 each Topical Q0600  . enoxaparin (LOVENOX) injection  40 mg Subcutaneous Q24H  . insulin aspart  0-6 Units Subcutaneous Q4H  . mouth rinse  15 mL Mouth Rinse BID  . mouth rinse  15 mL Mouth Rinse 10 times per day  . nicotine  21 mg Transdermal Daily  . pantoprazole  40 mg Intravenous Q12H   PRN Medications: docusate sodium, LORazepam, ondansetron (ZOFRAN) IV Hemodynamic parameters:   Intake/Output: 06/23 0701 - 06/24  0700 In: 1385.5 [I.V.:874.2; IV Piggyback:511.3] Out: 563 [Urine:825]  Ventilator  Settings: Vent Mode: PRVC FiO2 (%):  [40 %] 40 % Set Rate:  [15 bmp] 15 bmp Vt Set:  [500 mL] 500 mL PEEP:  [5 cmH20] 5 cmH20 Plateau Pressure:  [17 cmH20-19 cmH20] 19 cmH20   LAB RESULTS:  Basic Metabolic Panel: Recent Labs  Lab 09/25/19 2054 09/26/19 0050 09/26/19 0602 09/26/19 0602 09/26/19 1757 09/26/19 1757 09/27/19 0427 09/27/19 1445 09/28/19 0426 09/28/19 0426 09/29/19 0423 09/30/19 0411  NA 134*   < > 134*   < > 136  --  135  --  136  --  141 143  K <2.0*   < > 2.8*   < > 2.7*   < > 3.3*   < > 4.4   < > 3.5 3.0*  CL 90*   < > 90*   < > 92*  --  93*  --  96*  --  98 103  CO2 33*   < > 30   < > 33*  --  26  --  18*  --  29 28  GLUCOSE 230*   < > 251*   < > 129*  --  169*  --  117*  --  122* 124*  BUN 9   < > 8   < > 9  --  12  --  19  --  29* 33*  CREATININE 1.22   < > 1.17   < > 1.25*  --  1.56*  --  1.95*  --  2.21* 1.54*  CALCIUM 8.1*   < > 7.8*   < > 7.5*  --  7.3*  --  7.0*  --  7.0* 6.8*  MG 2.8*   < > 2.8*  --   --   --  2.3  --  2.2  --  2.0 1.9  PHOS 1.1*  --  3.1  --   --   --  4.7*  --   --   --  5.5* 3.0   < > = values in this interval not displayed.   Liver Function Tests: Recent Labs  Lab 09/25/19 0608 09/25/19 2054 09/26/19 0602 09/30/19 0411  AST 81* 174* 145* 3,019*  ALT 34 51* 46* 975*  ALKPHOS 35* 36* 29* 32*  BILITOT 1.7* 1.1 1.3* 3.5*  PROT 6.7 5.9* 5.6* 5.0*  ALBUMIN 3.2* 2.6* 2.5* 2.1*   Recent Labs  Lab 09/25/19 0608  LIPASE 84*   No results for input(s): AMMONIA in the last 168 hours. CBC: Recent Labs  Lab 09/26/19 0602 09/27/19 0427 09/28/19 0558 09/29/19 0423 09/30/19 0411  WBC 9.3 9.4 9.2 7.9 5.9  NEUTROABS  --  7.8* 7.7 5.9 3.9  HGB  9.4* 9.4* 9.0* 9.3* 9.5*  HCT 26.9* 25.9* 25.9* 26.1* 26.8*  MCV 97.8 95.9 100.0 97.8 96.8  PLT 143* 135* 117* 150 142*   Cardiac Enzymes: No results for input(s): CKTOTAL, CKMB, CKMBINDEX,  TROPONINI in the last 168 hours. BNP: Invalid input(s): POCBNP CBG: Recent Labs  Lab 09/29/19 1633 09/29/19 1927 09/30/19 0024 09/30/19 0353 09/30/19 0731  GLUCAP 89 100* 94 124* 125*       IMAGING RESULTS:  Imaging: DG Chest Port 1 View  Result Date: 09/28/2019 CLINICAL DATA:  Intubation.  CHF EXAM: PORTABLE CHEST 1 VIEW COMPARISON:  09/27/2019 FINDINGS: Endotracheal tube has been placed in the interval and is in good position. NG tube in the stomach with the tip not visualized. Diffuse bilateral airspace disease appears unchanged. Bibasilar atelectasis also unchanged. No effusion. IMPRESSION: Endotracheal tube in good position Diffuse bilateral airspace disease unchanged, most likely pulmonary edema versus pneumonia. Electronically Signed   By: Franchot Gallo M.D.   On: 09/28/2019 16:52        ASSESSMENT AND PLAN    -Multidisciplinary rounds held today   Circulatory shock - present on admission -likely cardiogenic vs possible distributive shock -currently on epinephrine drip, lidocaine drip -s/p VF with shock -continue bipap -diurese post improvement in BP -Cardiology on case - appreciate input -septic workup today - patient empirically was on Zosyn for asp PNA >>Unasyn with pharmD mgt   Acute decompensated systolic CHF  -TTE - EF 89-21%  - BNP >3000 -cardiology on case - appreciate input - Disucussed with Dr Saralyn Pilar today - Severe NICM, PAF, PVT  - pulmonary edema bilaterally on CXR  -correction of electrolytes in process - pharmacy consult  -strict I&O with fluid restriction -complicated by AF, and mod-severe MR -continue Foley Catheter-assess need daily   Acute Hypoxic Respiratory Failure -due to bilateral interstitial edema with possible aspiration event -cannot diurese at this time due to hypotension with vasopresossor support -ABG - 6/21 -BIPAP - RT - weaning as able    Alcoholism with EtOH withdrawal syndrome  - implement [place] alcohol  withdrawal protocol  -CIWA  - wean off of all sedation as able -currently on Precedex  -thiamine and folate repletion without banana bag due to volume status   Severe protein calorie malnutrition    - bitemporal wasting   - albumin is low    - high likelyhood of re-feeeding syndrome    - dietary consultation    ID -continue IV abx as prescibed -follow up cultures  GI/Nutrition GI PROPHYLAXIS as indicated DIET-->TF's as tolerated Constipation protocol as indicated  ENDO - ICU hypoglycemic\Hyperglycemia protocol -check FSBS per protocol   ELECTROLYTES -follow labs as needed -replace as needed -pharmacy consultation   DVT/GI PRX ordered -SCDs  TRANSFUSIONS AS NEEDED MONITOR FSBS ASSESS the need for LABS as needed   Critical care provider statement:    Critical care time (minutes):  33   Critical care time was exclusive of:  Separately billable procedures and treating other patients   Critical care was necessary to treat or prevent imminent or life-threatening deterioration of the following conditions:  Acute decompensated systolic chf, PAF, VF, MR, alcoholism, electrolyte derrangement, CHF, alcohol withdrawal, acute hypoxemia, multiple comorbid conditions   Critical care was time spent personally by me on the following activities:  Development of treatment plan with patient or surrogate, discussions with consultants, evaluation of patient's response to treatment, examination of patient, obtaining history from patient or surrogate, ordering and performing treatments and interventions, ordering and review  of laboratory studies and re-evaluation of patient's condition.  I assumed direction of critical care for this patient from another provider in my specialty: no    This document was prepared using Dragon voice recognition software and may include unintentional dictation errors.    Ottie Glazier, M.D.  Division of [place]

## 2019-10-01 DIAGNOSIS — K7200 Acute and subacute hepatic failure without coma: Secondary | ICD-10-CM

## 2019-10-01 DIAGNOSIS — Z7189 Other specified counseling: Secondary | ICD-10-CM

## 2019-10-01 DIAGNOSIS — I959 Hypotension, unspecified: Secondary | ICD-10-CM

## 2019-10-01 DIAGNOSIS — I429 Cardiomyopathy, unspecified: Secondary | ICD-10-CM

## 2019-10-01 DIAGNOSIS — I469 Cardiac arrest, cause unspecified: Secondary | ICD-10-CM

## 2019-10-01 DIAGNOSIS — R579 Shock, unspecified: Secondary | ICD-10-CM

## 2019-10-01 DIAGNOSIS — Z66 Do not resuscitate: Secondary | ICD-10-CM

## 2019-10-01 DIAGNOSIS — Z515 Encounter for palliative care: Secondary | ICD-10-CM

## 2019-10-01 LAB — COMPREHENSIVE METABOLIC PANEL
ALT: 733 U/L — ABNORMAL HIGH (ref 0–44)
AST: 1288 U/L — ABNORMAL HIGH (ref 15–41)
Albumin: 2 g/dL — ABNORMAL LOW (ref 3.5–5.0)
Alkaline Phosphatase: 35 U/L — ABNORMAL LOW (ref 38–126)
Anion gap: 8 (ref 5–15)
BUN: 27 mg/dL — ABNORMAL HIGH (ref 6–20)
CO2: 32 mmol/L (ref 22–32)
Calcium: 7.1 mg/dL — ABNORMAL LOW (ref 8.9–10.3)
Chloride: 101 mmol/L (ref 98–111)
Creatinine, Ser: 1.22 mg/dL (ref 0.61–1.24)
GFR calc Af Amer: >60 mL/min (ref >60)
GFR calc non Af Amer: >60 mL/min (ref >60)
Glucose, Bld: 133 mg/dL — ABNORMAL HIGH (ref 70–99)
Potassium: 2.8 mmol/L — ABNORMAL LOW (ref 3.5–5.1)
Sodium: 141 mmol/L (ref 135–145)
Total Bilirubin: 3.2 mg/dL — ABNORMAL HIGH (ref 0.3–1.2)
Total Protein: 5 g/dL — ABNORMAL LOW (ref 6.5–8.1)

## 2019-10-01 LAB — CBC WITH DIFFERENTIAL/PLATELET
Abs Immature Granulocytes: 0.16 10*3/uL — ABNORMAL HIGH (ref 0.00–0.07)
Basophils Absolute: 0 10*3/uL (ref 0.0–0.1)
Basophils Relative: 1 %
Eosinophils Absolute: 0.1 10*3/uL (ref 0.0–0.5)
Eosinophils Relative: 3 %
HCT: 27.7 % — ABNORMAL LOW (ref 39.0–52.0)
Hemoglobin: 9.7 g/dL — ABNORMAL LOW (ref 13.0–17.0)
Immature Granulocytes: 3 %
Lymphocytes Relative: 22 %
Lymphs Abs: 1 10*3/uL (ref 0.7–4.0)
MCH: 34 pg (ref 26.0–34.0)
MCHC: 35 g/dL (ref 30.0–36.0)
MCV: 97.2 fL (ref 80.0–100.0)
Monocytes Absolute: 0.5 10*3/uL (ref 0.1–1.0)
Monocytes Relative: 10 %
Neutro Abs: 2.8 10*3/uL (ref 1.7–7.7)
Neutrophils Relative %: 61 %
Platelets: 142 10*3/uL — ABNORMAL LOW (ref 150–400)
RBC: 2.85 MIL/uL — ABNORMAL LOW (ref 4.22–5.81)
RDW: 12.8 % (ref 11.5–15.5)
WBC: 4.7 10*3/uL (ref 4.0–10.5)
nRBC: 5.6 % — ABNORMAL HIGH (ref 0.0–0.2)

## 2019-10-01 LAB — PHOSPHORUS: Phosphorus: 1.9 mg/dL — ABNORMAL LOW (ref 2.5–4.6)

## 2019-10-01 LAB — PATHOLOGIST SMEAR REVIEW

## 2019-10-01 LAB — MAGNESIUM: Magnesium: 1.8 mg/dL (ref 1.7–2.4)

## 2019-10-01 MED ORDER — MORPHINE SULFATE (PF) 4 MG/ML IV SOLN
4.0000 mg | INTRAVENOUS | Status: DC | PRN
  Administered 2019-10-02 – 2019-10-03 (×4): 4 mg via INTRAVENOUS
  Filled 2019-10-01 (×4): qty 1

## 2019-10-01 MED ORDER — HALOPERIDOL LACTATE 2 MG/ML PO CONC
0.5000 mg | ORAL | Status: DC | PRN
  Filled 2019-10-01: qty 0.3

## 2019-10-01 MED ORDER — POTASSIUM CHLORIDE 10 MEQ/50ML IV SOLN
10.0000 meq | INTRAVENOUS | Status: AC
  Administered 2019-10-01 (×2): 10 meq via INTRAVENOUS
  Filled 2019-10-01 (×2): qty 50

## 2019-10-01 MED ORDER — HALOPERIDOL 0.5 MG PO TABS
0.5000 mg | ORAL_TABLET | ORAL | Status: DC | PRN
  Filled 2019-10-01: qty 1

## 2019-10-01 MED ORDER — POLYVINYL ALCOHOL 1.4 % OP SOLN
1.0000 [drp] | Freq: Four times a day (QID) | OPHTHALMIC | Status: DC | PRN
  Filled 2019-10-01: qty 15

## 2019-10-01 MED ORDER — HALOPERIDOL LACTATE 5 MG/ML IJ SOLN
0.5000 mg | INTRAMUSCULAR | Status: DC | PRN
  Administered 2019-10-03: 0.5 mg via INTRAVENOUS
  Filled 2019-10-01: qty 1

## 2019-10-01 MED ORDER — LORAZEPAM 2 MG/ML PO CONC
2.0000 mg | ORAL | Status: DC | PRN
  Filled 2019-10-01: qty 1

## 2019-10-01 MED ORDER — BIOTENE DRY MOUTH MT LIQD: 15.0000 mL | OROMUCOSAL | Status: DC | PRN

## 2019-10-01 MED ORDER — GLYCOPYRROLATE 0.2 MG/ML IJ SOLN
0.2000 mg | INTRAMUSCULAR | Status: DC | PRN
  Filled 2019-10-01: qty 1

## 2019-10-01 MED ORDER — LORAZEPAM 2 MG/ML PO CONC: 1.0000 mg | ORAL | Status: DC | PRN

## 2019-10-01 MED ORDER — LORAZEPAM 2 MG PO TABS: 2.0000 mg | ORAL_TABLET | ORAL | Status: DC | PRN

## 2019-10-01 MED ORDER — LORAZEPAM 2 MG/ML IJ SOLN
2.0000 mg | INTRAMUSCULAR | Status: DC | PRN
  Administered 2019-10-02 – 2019-10-03 (×2): 2 mg via INTRAVENOUS
  Filled 2019-10-01 (×2): qty 1

## 2019-10-01 MED ORDER — GLYCOPYRROLATE 1 MG PO TABS
1.0000 mg | ORAL_TABLET | ORAL | Status: DC | PRN
  Filled 2019-10-01: qty 1

## 2019-10-01 MED ORDER — LORAZEPAM 2 MG/ML IJ SOLN: 1.0000 mg | INTRAMUSCULAR | Status: DC | PRN

## 2019-10-01 MED ORDER — ACETAMINOPHEN 325 MG PO TABS: 650.0000 mg | ORAL_TABLET | Freq: Four times a day (QID) | ORAL | Status: DC | PRN

## 2019-10-01 MED ORDER — MAGNESIUM SULFATE 2 GM/50ML IV SOLN
2.0000 g | Freq: Once | INTRAVENOUS | Status: AC
  Administered 2019-10-01: 2 g via INTRAVENOUS
  Filled 2019-10-01: qty 50

## 2019-10-01 MED ORDER — LORAZEPAM 1 MG PO TABS: 1.0000 mg | ORAL_TABLET | ORAL | Status: DC | PRN

## 2019-10-01 MED ORDER — ACETAMINOPHEN 650 MG RE SUPP: 650.0000 mg | Freq: Four times a day (QID) | RECTAL | Status: DC | PRN

## 2019-10-01 MED ORDER — POTASSIUM PHOSPHATES 15 MMOLE/5ML IV SOLN
30.0000 mmol | Freq: Once | INTRAVENOUS | Status: AC
  Administered 2019-10-01: 30 mmol via INTRAVENOUS
  Filled 2019-10-01: qty 10

## 2019-10-01 NOTE — TOC Progression Note (Signed)
Transition of Care [facility]) - Progression Note    Patient Details  Name: Alexander Welch MRN: [id number] Date of Birth: September 10, 1960  Transition of Care [facility]) CM/SW [place], [place] Phone Number: [phone] 10/01/2019, 10:17 AM  Clinical Narrative:     Patient is currently comfort care this CSW will delay COPD/Heart Failure consult, if it become necessary.  CSW has spoken to Ms. Pankratz about financial concerns, and told her to contact me if she had any further questions or concerns.        Expected Discharge Plan and Services                                                 Social Determinants of Health (SDOH) Interventions    Readmission Risk Interventions No flowsheet data found.

## 2019-10-01 NOTE — Progress Notes (Signed)
[facility] Liaison note: New referral for [facility] hospice home received from  Palliative NP Mariana Kaufman. TOC Teresita Maxey made aware aware. Patient information given to referral. Hospice eligibility has been confirmed.  Writer met in the room with patient and his wife Seward Carol to initiate education regarding hospice services, philosophy, team approach to care and current visitation policy with understanding voiced.  Unfortunately [facility] is unable to offer a room today.  Family and hospital care team made aware.  The hospice home staff with contact the weekend TOC if a bed becomes available.  Patient will require EMS transport with signed out of facility DNR in place.  Thank you the opportunity to be involved in the care of this patient and his family.  Flo Shanks BSN, RN, [place] [phone].

## 2019-10-01 NOTE — Progress Notes (Signed)
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 Noted to be hypotensive tachycardic but lucid and AAOx4 per H/P. He had electrolyte derrangments with devt of AFrVR. While in MICU he went into VF received 200J defib with ROSC and was able to speak immediately after and was subsequently in sinus rythm.   Lines / Drains: R-fem-TLC PIV  Cultures / Sepsis markers: -Blood culture x2 -pending -Procal with mild elevation - 0.3   Antibiotics: Zosyn>>unasy for empiric aspiration    Protocols / Consultants: Cardio, pccm  Tests / Events: TTE   09/28/19- Goals of care discussion with wife at bedside, explained patient with worsening lactate, worsening pulm edema and overall poor prognosis remains on vasopressor support with advanced CHF and alcoholism.  Wife wishes to continue fully aggressive care.  Explained high risk for intubation and family wishes are to continue and proceed with intubation but not for prolonged periods and continue full code scope of care at this time.  6/23- patient requirement of levophed increased overnight, we are having goals of care discussion today with family.  Wife wishes to proceed with DNR and transition to comfort measures tomorow with family at bedside after terminal extubation.  6/24- patient self extubated today. He is speaking but slow and labored.  I discussed with family and they came to hospital to be with him. He is on comfort care measures.       CC  follow up respiratory failure  SUBJECTIVE Multiorgan dysfunction Prognosis is guarded End stage CHF Liver disease    BP (!) 63/44   Pulse 72   Temp (!) 96.3 F (35.7 C)   Resp 11   Ht 5' 7" (1.702 m)   Wt 77.8 kg   SpO2 90%   BMI 26.86 kg/m    I/O last 3 completed shifts: In: 1431.9  [I.V.:920.6; IV Piggyback:511.3] Out: 1585 [Urine:1585] No intake/output data recorded.  SpO2: 90 % O2 Flow Rate (L/min): 6 L/min FiO2 (%): 40 %  Estimated body mass index is 26.86 kg/m as calculated from the following:   Height as of this encounter: 5' 7" (1.702 m).   Weight as of this encounter: 77.8 kg.   Review of Systems:  Gen:  Denies  fever, sweats, chills weight loss  HEENT: Denies blurred vision, double vision, ear pain, eye pain, hearing loss, nose bleeds, sore throat Cardiac:  No dizziness, chest pain or heaviness, chest tightness,edema, No JVD Resp:   No cough, -sputum production, -shortness of breath,-wheezing, -hemoptysis,  Other:  All other systems negative    PHYSICAL EXAMINATION:  GENERAL:critically ill appearing,  HEAD: Normocephalic, atraumatic.  EYES: Pupils equal, round, reactive to light.  No scleral icterus.  MOUTH: Moist mucosal membrane. NECK: Supple.  PULMONARY: +rhonchi,  CARDIOVASCULAR: S1 and S2. Regular rate and rhythm. No murmurs, rubs, or gallops.  GASTROINTESTINAL: Soft, nontender, +distended.  Positive bowel sounds.   MUSCULOSKELETAL:+ swelling, + edema.  NEUROLOGIC:alert and awake SKIN:intact,warm,dry  MEDICATIONS: I have reviewed all medications and confirmed regimen as documented   CULTURE RESULTS   Recent Results (from the past 240 hour(s))  MRSA PCR Screening     Status: None   Collection Time: 09/25/19  6:07 AM   Specimen: Nasopharyngeal  Result Value Ref Range Status   MRSA by PCR NEGATIVE NEGATIVE Final    Comment:  The GeneXpert MRSA Assay (FDA approved for NASAL specimens only), is one component of a comprehensive MRSA colonization surveillance program. It is not intended to diagnose MRSA infection nor to guide or monitor treatment for MRSA infections. Performed at [facility], [address] [place], [place] [address]   SARS Coronavirus 2 by RT PCR (hospital order, performed in [facility]  hospital lab) Nasopharyngeal Nasopharyngeal Swab     Status: None   Collection Time: 09/25/19  9:00 AM   Specimen: Nasopharyngeal Swab  Result Value Ref Range Status   SARS Coronavirus 2 NEGATIVE NEGATIVE Final    Comment: (NOTE) SARS-CoV-2 target nucleic acids are NOT DETECTED.  The SARS-CoV-2 RNA is generally detectable in upper and lower respiratory specimens during the acute phase of infection. The lowest concentration of SARS-CoV-2 viral copies this assay can detect is 250 copies / mL. A negative result does not preclude SARS-CoV-2 infection and should not be used as the sole basis for treatment or other patient management decisions.  A negative result may occur with improper specimen collection / handling, submission of specimen other than nasopharyngeal swab, presence of viral mutation(s) within the areas targeted by this assay, and inadequate number of viral copies (<250 copies / mL). A negative result must be combined with clinical observations, patient history, and epidemiological information.  Fact Sheet for Patients:   StrictlyIdeas.no  Fact Sheet for Healthcare Providers: BankingDealers.co.za  This test is not yet approved or  cleared by the [place] FDA and has been authorized for detection and/or diagnosis of SARS-CoV-2 by FDA under an Emergency Use Authorization (EUA).  This EUA will remain in effect (meaning this test can be used) for the duration of the COVID-19 declaration under Section 564(b)(1) of the Act, 21 U.S.C. section 360bbb-3(b)(1), unless the authorization is terminated or revoked sooner.  Performed at [facility], [address] [place], [place] [address]   CULTURE, BLOOD (ROUTINE X 2) w Reflex to ID Panel     Status: None (Preliminary result)   Collection Time: 09/27/19  9:34 AM   Specimen: BLOOD  Result Value Ref Range Status   Specimen Description BLOOD LEFTWRIST  Final   Special  Requests   Final    BOTTLES DRAWN AEROBIC AND ANAEROBIC Blood Culture adequate volume   Culture   Final    NO GROWTH 3 DAYS Performed at [facility], [address], [place], [place] [address]    Report Status PENDING  Incomplete  CULTURE, BLOOD (ROUTINE X 2) w Reflex to ID Panel     Status: None (Preliminary result)   Collection Time: 09/27/19  9:39 AM   Specimen: BLOOD  Result Value Ref Range Status   Specimen Description BLOOD LINEDRAW  Final   Special Requests   Final    BOTTLES DRAWN AEROBIC AND ANAEROBIC Blood Culture adequate volume   Culture   Final    NO GROWTH 3 DAYS Performed at [facility], [address] [place], [place] [address]    Report Status PENDING  Incomplete             ASSESSMENT AND PLAN SYNOPSIS   Severe ACUTE Hypoxic and Hypercapnic Respiratory Failure due to decompensated systolic CHF with cardiogenic shock ef 20%  With cardiogenic shockddueue to decom  ACUTE SYSTOLIC CARDIAC FAILURE- EF 20% No further recs at this time -oxygen as needed   SHOCK No pressors to be started at this time    DIET-->as tolerated Constipation protocol as indicated  CASE DISCUSSED IN MULTIDISCIPLINARY ROUNDS WITH ICU TEAM  Critical Care Time devoted to patient care services described in this note is 31 minutes.   Overall, patient is critically ill, prognosis is guarded.  Patient with Multiorgan failure and at high risk for cardiac arrest and death.   I met with patient and wife at bedside, at this time, we are at an understanding that he has multiorgan failure and patient does NOT wish for vent support, no CPR and will NOT start pressors.  Will continue Comfort care measures  Transfer to [facility]    Corrin Parker, M.D.  [facility]  Medical Director [place] Director [facility] Cardio-Pulmonary Department

## 2019-10-01 NOTE — Progress Notes (Signed)
[place] for Electrolyte Monitoring and Replacement   Recent Labs: Potassium (mmol/L)  Date Value  10/01/2019 2.8 (L)   Magnesium (mg/dL)  Date Value  10/01/2019 1.8   Calcium (mg/dL)  Date Value  10/01/2019 7.1 (L)   Albumin (g/dL)  Date Value  10/01/2019 2.0 (L)   Phosphorus (mg/dL)  Date Value  10/01/2019 1.9 (L)   Sodium (mmol/L)  Date Value  10/01/2019 141     Assessment: 59 year old male with weakness, emesis. Patient with significant electrolyte abnormalities requiring replacement for several days. Patient off lidocaine drip 6/22. Current plan is to continue with current measures aimed at comfort with no escalation in care. Continuing to replace electrolytes as needed.  Goal of Therapy:  Electrolytes WNL, K ~ 4 and Mg ~ 2 in setting of cardiac history  Plan:  Patient receiving potassium phos 30 mmol IV x 1 + potassium 10 mEq IV x 2 + mag 2 g IV x 1. Follow up electrolytes with morning labs.  Tawnya Crook ,PharmD Clinical Pharmacist 10/01/2019 12:28 PM

## 2019-10-01 NOTE — Consult Note (Signed)
Consultation Note Date: 10/01/2019   Patient Name: Alexander Welch  DOB: Aug 16, 1960  MRN: [id number]  Age / Sex: 59 y.o., male  PCP: Alexander Hartigan, MD Referring Physician: Ottie Glazier, MD  Reason for Consultation: Establishing goals of care  HPI/Patient Profile: 59 y.o. male  with past medical history of ischemic cardiomyopathy, HTN, thoracic aortic aneurysm, PVD s/p lower extremity revascularization, CHF with most recent ECHO showing EF 18% (complicated by history of heavy alcohol use) admitted on 09/25/2019 with syncope and vomiting. Workup revealed CHF exacerbation, and significant electrolyte abnormalities. He developed torsades and coded. Was resuscitated- however, continued to require pressors and on 6/22 had worsening lactate and pulmonary edema and was intubated. 6/23 discussion was had with family members with decisions to extubated to comfort on 6/24. He self extubated on 6/24. He remains hypotensive, confused. Liver enzymes are significantly elevated likely due to circulatory shock. Palliative medicine consulted for goals of care.    Clinical Assessment and Goals of Care:  I have reviewed medical records including EPIC notes, labs and imaging, examined the patient and met at bedside with patient's spouse- Alexander Welch- to discuss diagnosis prognosis, GOC, EOL wishes, disposition and options.  I evaluated the patient at bedside. He is awake and alert, however, he is not oriented to place or time. He is unable to understand his medical condition or make decisions regarding his medical care.   I introduced Palliative Medicine as specialized medical care for people living with serious illness. It focuses on providing relief from the symptoms and stress of a serious illness.   We discussed a brief life review of the patient. He has not worked this past year due to Alexander Welch. Was living at home with his  spouse- Alexander Welch.   As far as functional and nutritional status- prior to admission he had been having unexplained weight loss, decreased appetite. He was significantly debilitated, spends his days on the couch with difficulty rising. Recently has become incontinent.   We discussed her current illness and what it means in the larger context of her on-going co-morbidities.  Natural disease trajectory and expectations at EOL were discussed.  The difference between aggressive medical intervention and comfort care was considered in light of the patient's goals of care. We discussed that while patient has shown some improvement in the last few days- it is not likely that he will be able to return home and live independently. He has very advanced heart failure, and is at significant risk of decompensating very rapidly without the current interventions.   Alexander Welch states that patient would not want life prolonging measures. He would not want to live in a nursing facilty. Hers and his main goals of care given his end stage heart failure and significantly debilitated state would be to have comfort provided- ensure no pain, no shortness of breath, kept clean and dry- and to be supported through dying process.   After discussion with Alexander Welch, and with Alexander Welch's permission I discussed all of the above with patient's present siblings.  Hospice  and Palliative Care services outpatient were explained and offered.  Questions and concerns were addressed.  The family was encouraged to call with questions or concerns.    Primary Decision Maker NEXT OF KIN - spouse- Alexander Welch    SUMMARY OF RECOMMENDATIONS -Full comfort measures only -Request placement at [facility] as ordered -Medications and interventions not intended for support dc'd    Code Status/Advance Care Planning:  DNR   Additional Recommendations (Limitations, Scope, Preferences):  Full Comfort Care   Prognosis:    < 2  weeks  Discharge Planning: Hospice facility  Primary Diagnoses: Present on Admission: **None**   I have reviewed the medical record, interviewed the patient and family, and examined the patient. The following aspects are pertinent.  Past Medical History:  Diagnosis Date  . Abnormal CT scan, colon 11/27/2017  . Alcohol abuse   . Cardiomyopathy ([place])   . CHF (congestive heart failure) ([place])   . Colon polyps   . ED (erectile dysfunction)   . Hypertension   . Hypertriglyceridemia   . Osteoarthritis   . Osteoarthritis    Social History   Socioeconomic History  . Marital status: Married    Spouse name: Alexander Welch   . Number of children: 1  . Years of education: Not on file  . Highest education level: Not on file  Occupational History  . Occupation: disability   Tobacco Use  . Smoking status: Current Every Day Smoker    Packs/day: 1.00    Years: 40.00    Pack years: 40.00    Types: Cigarettes  . Smokeless tobacco: Never Used  Vaping Use  . Vaping Use: Never used  Substance and Sexual Activity  . Alcohol use: Yes    Comment: 1/4 pint of vodka daily  . Drug use: Yes    Types: Marijuana  . Sexual activity: Yes  Other Topics Concern  . Not on file  Social History Narrative   Lives at home with wife and daughter    Social Determinants of Health   Financial Resource Strain:   . Difficulty of Paying Living Expenses:   Food Insecurity:   . Worried About Charity fundraiser in the Last Year:   . Arboriculturist in the Last Year:   Transportation Needs:   . Film/video editor (Medical):   Marland Kitchen Lack of Transportation (Non-Medical):   Physical Activity:   . Days of Exercise per Week:   . Minutes of Exercise per Session:   Stress:   . Feeling of Stress :   Social Connections:   . Frequency of Communication with Friends and Family:   . Frequency of Social Gatherings with Friends and Family:   . Attends Religious Services:   . Active Member of Clubs or Organizations:    . Attends Archivist Meetings:   Marland Kitchen Marital Status:    Family History  Problem Relation Age of Onset  . Heart failure Mother   . Renal Disease Mother   . Bone cancer Father    Scheduled Meds: . chlorhexidine gluconate (MEDLINE KIT)  15 mL Mouth Rinse BID  . Chlorhexidine Gluconate Cloth  6 each Topical Q0600  . mouth rinse  15 mL Mouth Rinse BID  . nicotine  21 mg Transdermal Daily  . pantoprazole  40 mg Intravenous Q12H   Continuous Infusions: . sodium chloride 10 mL/hr at 09/25/19 1600   PRN Meds:.docusate sodium, LORazepam, ondansetron (ZOFRAN) IV Medications Prior to Admission:  Prior to Admission  medications   Medication Sig Start Date End Date Taking? Authorizing Provider  aspirin EC 81 MG tablet Take 81 mg by mouth daily.    Yes [provider]  atorvastatin (LIPITOR) 10 MG tablet Take 1 tablet (10 mg total) by mouth daily. 06/21/19 06/20/20 Yes Dew, Erskine Squibb, MD  carvedilol (COREG) 3.125 MG tablet Take 3.125 mg by mouth 2 (two) times daily with a meal.    Yes [provider]  cetirizine (ZYRTEC) 10 MG tablet Take 10 mg by mouth daily.    Yes [provider]  clopidogrel (PLAVIX) 75 MG tablet Take 1 tablet (75 mg total) by mouth daily. 06/21/19  Yes Algernon Huxley, MD  fenofibrate 160 MG tablet Take 160 mg by mouth daily.    Yes [provider]  ferrous sulfate 325 (65 FE) MG tablet Take 325 mg by mouth daily with breakfast.    Yes [provider]  folic acid (FOLVITE) 1 MG tablet Take 1 tablet (1 mg total) by mouth daily. 10/13/18  Yes Lloyd Huger, MD  furosemide (LASIX) 20 MG tablet Take 20 mg by mouth daily as needed for edema.   Yes [provider]  gabapentin (NEURONTIN) 300 MG capsule Take 300-600 mg by mouth See admin instructions. Take 1 capsule (348m) by mouth ever morning and take 2 capsules (6045m by mouth every night   Yes [provider]  PARoxetine (PAXIL) 10 MG tablet Take 10 mg by  mouth daily.   Yes [provider]  potassium chloride SA (KLOR-CON) 20 MEQ tablet Take 20 mEq by mouth daily.   Yes [provider]  sacubitril-valsartan (ENTRESTO) 49-51 MG Take 1 tablet by mouth 2 (two) times daily.  07/08/19  Yes [provider]  sildenafil (REVATIO) 20 MG tablet Take 40-60 mg by mouth as needed.    Yes [provider]  spironolactone (ALDACTONE) 25 MG tablet Take 12.5 mg by mouth daily.    Yes [provider]  vitamin B-12 (CYANOCOBALAMIN) 1000 MCG tablet Take 1,000 mcg by mouth daily.    Yes [provider]   No Known Allergies Review of Systems  Unable to perform ROS: Mental status change    Physical Exam Vitals and nursing note reviewed.  Constitutional:      Appearance: He is ill-appearing.  Cardiovascular:     Rate and Rhythm: Rhythm irregular.     Comments: Diffuse anasarca in upper and lower extremities Pulmonary:     Comments: Dyspnea when talking or any exertion Skin:    Coloration: Skin is jaundiced.  Neurological:     Mental Status: He is alert.     Motor: Weakness present.     Comments: Not oriented to place, time or situation     Vital Signs: BP (!) 62/42 (BP Location: Right Arm)   Pulse 72   Temp (!) 97.5 F (36.4 C) (Axillary)   Resp 11   Ht _0  (1.702 m)   Wt 77.8 kg   SpO2 90%   BMI 26.86 kg/m  Pain Scale: 0-10 POSS *See Group Information*: 1-Acceptable,Awake and alert Pain Score: 0-No pain   SpO2: SpO2: 90 % O2 Device:SpO2: 90 % O2 Flow Rate: .O2 Flow Rate (L/min): 4 L/min  IO: Intake/output summary:   Intake/Output Summary (Last 24 hours) at 10/01/2019 1423 Last data filed at 10/01/2019 0347 Gross per 24 hour  Intake 46.42 ml  Output 760 ml  Net -713.58 ml    LBM: Last BM Date: 09/26/19  Baseline Weight: Weight: 68 kg Most recent weight: Weight: 77.8 kg     Palliative Assessment/Data: PPS: 20%     Thank you for this consult. Palliative medicine will continue  to follow and assist as needed.   Time In: 1230 Time Out: 1445 Time Total: 135 minutes Prolonged time: yes Greater than 50%  of this time was spent counseling and coordinating care related to the above assessment and plan.  Signed by: Mariana Kaufman, AGNP-C Palliative Medicine    Please contact Palliative Medicine Team phone at [phone] for questions and concerns.  For individual provider: See Shea Evans

## 2019-10-02 DIAGNOSIS — I5023 Acute on chronic systolic (congestive) heart failure: Secondary | ICD-10-CM

## 2019-10-02 LAB — CULTURE, BLOOD (ROUTINE X 2)
Culture: NO GROWTH
Culture: NO GROWTH
Special Requests: ADEQUATE
Special Requests: ADEQUATE

## 2019-10-02 NOTE — Progress Notes (Signed)
PROGRESS NOTE    Alexander Welch  CBS:[id number] DOB: 25-Nov-1960 DOA: 09/25/2019 PCP: Sofie Hartigan, MD    Brief Narrative:  The patient was admitted to the hospital with a working diagnosis of acute hypoxic respiratory failure due to acute on chronic systolic heart failure.  59 year old male who presented after a syncope episode.  He does have the significant past medical history for alcohol abuse, ischemic cardiomyopathy, systolic heart failure, hypertension, dyslipidemia, thoracic aortic aneurysm, tobacco disorder, superior mesenteric artery stenosis and peripheral artery disease.  Patient was sitting in the couch, watching TV when suddenly developed episode of intractable nausea and vomiting, posteriorly he lost his consciousness.  EMS was called and patient was brought to the hospital, on his initial physical examination his blood pressure was 77/58, heart rate 138 to 144 bpm, respiratory rate 22, oxygen saturation 94%.  His lungs are clear to auscultation bilaterally, heart S1-S2, present rhythmic, soft abdomen, no significant lower extremity edema, he was awake and alert. Sodium 132, potassium less than 2.0, chloride 82, bicarb 31, glucose 134, BUN 8, creatinine 1.38, calcium 8.7, AST 81, ALT 34, white count 8.7, hemoglobin 11.8, hematocrit 32.5, platelets 153.  SARS COVID-19 negative.  Alcohol level was 38.  Chest radiograph had bilateral interstitial infiltrates. EKG 163 bpm, left axis deviation, left anterior fascicular block with interventricular conduction delay, atrial fibrillation rhythm, poor R wave progression, no significant ST segment or T wave changes.  Patient was admitted to the intensive care unit, he was placed on vasopressors for his hypotension, received IV fluids and aggressive potassium correction.  Patient developed ventricular fibrillation cardiac arrest and received defibrillation with 200 J, recovering spontaneous circulation immediately.  He was placed on  alcohol withdrawal protocol along with thiamine and multivitamins.  Further work-up revealed ejection fraction 20 to 25% with moderate to severe mitral regurgitation.  On June 22 patient had progressive clinical duration and was placed on invasive mechanical ventilation.  The following day his CODE STATUS was changed to DNR by his family and he was transitioned to comfort measures due to poor prognosis.  On June 24 he self extubated.  Transferred to [facility] on June 26  Assessment & Plan:   Principal Problem:   Acute on chronic systolic CHF (congestive heart failure) (HCC) Active Problems:   Alcohol abuse   Ventricular tachyarrhythmia (HCC)   Acute respiratory failure (HCC)   Atrial fibrillation with RVR ([place])   Cardiac arrest ([employer])   Shock circulatory (HCC)   Shock liver   Hypotension   DNR (do not resuscitate)   Comfort measures only status   Palliative care by specialist   Advanced care planning/counseling discussion   Goals of care, counseling/discussion   1. Acute on chronic systolic heart failure, complicated with atrial fibrillation with rapid ventricular response and ventricular fibrillation with cardiac arrest. Patient very weak and deconditioned, he looks dyspneic at rest. His urine output over last 24 is 700 cc and oxygenation is 77 to 88 on 4 L/ min per [place]. HR 93 and rhythmic.   He has been transitioned to full comfort measures and all his cardiac medications have been discontinued.   Continue with as needed morphine, lorazepam and haldol.   2. AKI with hypokalemia and hypomagnesemia. Patient with edema, not further blood work.   Poor prognosis, off diuretic therapy.  3. Shock liver. Elevated liver enzymes. Patient with deconditioning but no frank encephalopathy.   4. Hypoxic respiratory failure due to cardiogenic pulmonary edema. Continue supplemental 02 for comfort measures.  5. Severe calorie protein malnutrition/ alcohol abuse. Continue with as needed lorazepam.    Patient is pending placement to Hospice.   Status is: Inpatient  Remains inpatient appropriate because:Unsafe d/c plan   Dispo: The patient is from: Home              Anticipated d/c is to: hospice              Anticipated d/c date is: 1 day              Patient currently is medically stable to d/c.  DVT prophylaxis: scd   Code Status:    dnr   Family Communication:  I spoke with patient's wife  at the bedside, we talked in detail about patient's condition, plan of care and prognosis and all questions were addressed.     Consultants:   Cardiology   Palliative care  Subjective: Patient very weak and deconditioned, he denies any dyspnea or chest pain, no nausea or vomiting.   Objective: Vitals:   10/01/19 2359 10/02/19 0500 10/02/19 0721 10/02/19 0746  BP: 122/72  120/73   Pulse: 93  93   Resp: 18  15   Temp: 99.3 F (37.4 C)  98.7 F (37.1 C)   TempSrc: Oral  Oral   SpO2: 96%  (!) 77% (!) 88%  Weight:  83.9 kg    Height:        Intake/Output Summary (Last 24 hours) at 10/02/2019 1230 Last data filed at 10/02/2019 1007 Gross per 24 hour  Intake 600 ml  Output 700 ml  Net -100 ml   Filed Weights   09/29/19 0210 09/30/19 0423 10/02/19 0500  Weight: 75.6 kg 77.8 kg 83.9 kg    Examination:   General: deconditioned and ill looking appearing  Neurology: Awake and alert, non focal  E ENT: positive pallor, no icterus, oral mucosa moist Cardiovascular: No JVD. S1-S2 present, rhythmic, no gallops, rubs, positive 3./6 systolic murmurs. +++ pitting lower extremity edema. Pulmonary: positive breath sounds bilaterally, decreased air movement at bases, no wheezing, rhonchi or rales. Gastrointestinal. Abdomen with , no organomegaly, non tender, no rebound or guarding Skin. No rashes Musculoskeletal: no joint deformities     Data Reviewed: I have personally reviewed following labs and imaging studies  CBC: Recent Labs  Lab 09/27/19 0427 09/28/19 0558  09/29/19 0423 09/30/19 0411 10/01/19 0046  WBC 9.4 9.2 7.9 5.9 4.7  NEUTROABS 7.8* 7.7 5.9 3.9 2.8  HGB 9.4* 9.0* 9.3* 9.5* 9.7*  HCT 25.9* 25.9* 26.1* 26.8* 27.7*  MCV 95.9 100.0 97.8 96.8 97.2  PLT 135* 117* 150 142* 315*   Basic Metabolic Panel: Recent Labs  Lab 09/26/19 0602 09/26/19 1757 09/27/19 0427 09/27/19 0427 09/27/19 1445 09/28/19 0426 09/29/19 0423 09/30/19 0411 10/01/19 0043 10/01/19 0046  NA 134*   < > 135  --   --  136 141 143  --  141  K 2.8*   < > 3.3*   < > 5.1 4.4 3.5 3.0*  --  2.8*  CL 90*   < > 93*  --   --  96* 98 103  --  101  CO2 30   < > 26  --   --  18* 29 28  --  32  GLUCOSE 251*   < > 169*  --   --  117* 122* 124*  --  133*  BUN 8   < > 12  --   --  19 29* 33*  --  27*  CREATININE 1.17   < > 1.56*  --   --  1.95* 2.21* 1.54*  --  1.22  CALCIUM 7.8*   < > 7.3*  --   --  7.0* 7.0* 6.8*  --  7.1*  MG 2.8*  --  2.3  --   --  2.2 2.0 1.9 1.8  --   PHOS 3.1  --  4.7*  --   --   --  5.5* 3.0  --  1.9*   < > = values in this interval not displayed.   GFR: Estimated Creatinine Clearance: 66.7 mL/min (by C-G formula based on SCr of 1.22 mg/dL). Liver Function Tests: Recent Labs  Lab 09/25/19 2054 09/26/19 0602 09/30/19 0411 10/01/19 0046  AST 174* 145* 3,019* 1,288*  ALT 51* 46* 975* 733*  ALKPHOS 36* 29* 32* 35*  BILITOT 1.1 1.3* 3.5* 3.2*  PROT 5.9* 5.6* 5.0* 5.0*  ALBUMIN 2.6* 2.5* 2.1* 2.0*   No results for input(s): LIPASE, AMYLASE in the last 168 hours. No results for input(s): AMMONIA in the last 168 hours. Coagulation Profile: Recent Labs  Lab 09/25/19 2054  INR 1.1   Cardiac Enzymes: No results for input(s): CKTOTAL, CKMB, CKMBINDEX, TROPONINI in the last 168 hours. BNP (last 3 results) No results for input(s): PROBNP in the last 8760 hours. HbA1C: No results for input(s): HGBA1C in the last 72 hours. CBG: Recent Labs  Lab 09/29/19 1633 09/29/19 1927 09/30/19 0024 09/30/19 0353 09/30/19 0731  GLUCAP 89 100* 94 124*  125*   Lipid Profile: No results for input(s): CHOL, HDL, LDLCALC, TRIG, CHOLHDL, LDLDIRECT in the last 72 hours. Thyroid Function Tests: No results for input(s): TSH, T4TOTAL, FREET4, T3FREE, THYROIDAB in the last 72 hours. Anemia Panel: No results for input(s): VITAMINB12, FOLATE, FERRITIN, TIBC, IRON, RETICCTPCT in the last 72 hours.    Radiology Studies: I have reviewed all of the imaging during this hospital visit personally     Scheduled Meds: . chlorhexidine gluconate (MEDLINE KIT)  15 mL Mouth Rinse BID  . Chlorhexidine Gluconate Cloth  6 each Topical Q0600  . mouth rinse  15 mL Mouth Rinse BID  . nicotine  21 mg Transdermal Daily  . pantoprazole  40 mg Intravenous Q12H   Continuous Infusions: . sodium chloride 10 mL/hr at 09/25/19 1600     LOS: 7 days        Sonu Kruckenberg Gerome Apley, MD

## 2019-10-03 NOTE — Progress Notes (Signed)
After the administration of ativan this morning at 0747, this patient has been resting comfortably upon this RN's visual assessment q2h. At approximately 1720, this RN noticed a fifth and sixth visitor attempting to enter the patient's room. This RN explained the [facility] visitor policy both for regular non-COVID patients, and non-COVID patients on comfort care. The patient's spouse stated she had been told previously there was no limit to the visitation policy. Charge RN Estill Bamberg paged Dr. Cathlean Sauer, and an unrestricted visitor order was placed for this patient. Throughout the conversation, the patient's spouse began shouting at both this Therapist, sports and the Agricultural consultant. Additionally at this time, another individual entered the room, and the total number of visitors in the room was 5.  Security was called, who entered the room with this RN to explain even with the unrestricted visitor policy in place, there could only be three visitors in the room at one time. The patient's spouse did eventually agree she understood the policy of a three-person-at-once visit.   This RN was then called into the room at approximately 1735. The patient's spouse stated she had called out for pain medication at 1620, "before the altercation." This RN apologized for the lack of response and reminded the patient's spouse to always feel free to call the number on the white board of the RN any time. Wife verbalized understanding. At this time, patient did not appear to be in any distress. This RN educated patient's spouse on changes in breathing and other end-of-life processes. The patient's spouse stated the patient's breathing had changed since she had been there, and she felt he was in pain. A pain assessment was performed (see flowsheet), and the decision was made to give a dose of morphine.   The patient was repositioned at this time, and wife reminded to feel free to call this RN's ASCOM if she needed assistance.

## 2019-10-03 NOTE — Progress Notes (Signed)
PROGRESS NOTE    Alexander Welch  HMC:[id number] DOB: 1960-09-23 DOA: 09/25/2019 PCP: Sofie Hartigan, MD    Brief Narrative:  The patient was admitted to the hospital with a working diagnosis of acute hypoxic respiratory failure due to acute on chronic systolic heart failure.  59 year old male who presented after a syncope episode.  He does have the significant past medical history for alcohol abuse, ischemic cardiomyopathy, systolic heart failure, hypertension, dyslipidemia, thoracic aortic aneurysm, tobacco disorder, superior mesenteric artery stenosis and peripheral artery disease.  Patient was sitting in the couch, watching TV when suddenly developed episode of intractable nausea and vomiting, posteriorly he lost his consciousness.  EMS was called and patient was brought to the hospital, on his initial physical examination his blood pressure was 77/58, heart rate 138 to 144 bpm, respiratory rate 22, oxygen saturation 94%.  His lungs are clear to auscultation bilaterally, heart S1-S2, present rhythmic, soft abdomen, no significant lower extremity edema, he was awake and alert. Sodium 132, potassium less than 2.0, chloride 82, bicarb 31, glucose 134, BUN 8, creatinine 1.38, calcium 8.7, AST 81, ALT 34, white count 8.7, hemoglobin 11.8, hematocrit 32.5, platelets 153.  SARS COVID-19 negative.  Alcohol level was 38.  Chest radiograph had bilateral interstitial infiltrates. EKG 163 bpm, left axis deviation, left anterior fascicular block with interventricular conduction delay, atrial fibrillation rhythm, poor R wave progression, no significant ST segment or T wave changes.  Patient was admitted to the intensive care unit, he was placed on vasopressors for his hypotension, received IV fluids and aggressive potassium correction.  Patient developed ventricular fibrillation cardiac arrest and received defibrillation with 200 J, recovering spontaneous circulation immediately.  He was placed  on alcohol withdrawal protocol along with thiamine and multivitamins.  Further work-up revealed ejection fraction 20 to 25% with moderate to severe mitral regurgitation.  On June 22 patient had progressive clinical duration and was placed on invasive mechanical ventilation.  The following day his CODE STATUS was changed to DNR by his family and he was transitioned to comfort measures due to poor prognosis.  On June 24 he self extubated.  Transferred to [facility] on June 26.   He has remained stable on the medical ward, pending to be transfer to hospice.    Assessment & Plan:   Principal Problem:   Acute on chronic systolic CHF (congestive heart failure) (HCC) Active Problems:   Alcohol abuse   Ventricular tachyarrhythmia (HCC)   Acute respiratory failure (HCC)   Atrial fibrillation with RVR (HCC)   Cardiac arrest (HCC)   Shock circulatory (HCC)   Shock liver   Hypotension   DNR (do not resuscitate)   Comfort measures only status   Palliative care by specialist   Advanced care planning/counseling discussion   Goals of care, counseling/discussion   1. Acute on chronic systolic heart failure, complicated with atrial fibrillation with rapid ventricular response and ventricular fibrillation with cardiac arrest. Today patient is now somnolent and hyporeactive. He dose not look in pain or distress.   Continue on full comfort measures, on as needed morphine, lorazepam and haldol.   2. AKI with hypokalemia and hypomagnesemia. Persistent edema, now off diuretic therapy.  3. Shock liver. Elevated liver enzymes. No further follow up due to poor prognosis.  4. Hypoxic respiratory failure due to cardiogenic pulmonary edema. On supplemental 02 for comfort measures.   5. Severe calorie protein malnutrition/ alcohol abuse.  PRN lorazepam.   Patient is pending placement to Hospice.    Status  is: Inpatient  Remains inpatient appropriate because:Unsafe d/c plan   Dispo: The patient  is from: Home              Anticipated d/c is to: hospice              Anticipated d/c date is: 1 day              Patient currently is not medically stable to d/c.    DVT prophylaxis: None   Code Status:   dnr  Family Communication:  I spoke with patient's sister at the bedside, we talked in detail about patient's condition, plan of care and prognosis and all questions were addressed.     Consultants:   Cardiology   Palliative care    Subjective: Patient somnolent and hyporeactive, not in pain or dyspnea.   Objective: Vitals:   10/02/19 0746 10/03/19 0500 10/03/19 0608 10/03/19 0737  BP:   137/85 125/77  Pulse:   84 80  Resp:   17 18  Temp:   97.8 F (36.6 C) 98.1 F (36.7 C)  TempSrc:   Oral Oral  SpO2: (!) 88%  91% (!) 77%  Weight:  82.7 kg    Height:        Intake/Output Summary (Last 24 hours) at 10/03/2019 1220 Last data filed at 10/03/2019 0634 Gross per 24 hour  Intake --  Output 1100 ml  Net -1100 ml   Filed Weights   09/30/19 0423 10/02/19 0500 10/03/19 0500  Weight: 77.8 kg 83.9 kg 82.7 kg    Examination:   General: deconditioned and ill looking appearing  Neurology: somnolent.   E ENT: positive pallor, no icterus, oral mucosa moist Cardiovascular: No JVD. S1-S2 present, rhythmic, no gallops, rubs, or murmurs. No lower extremity edema. Pulmonary:  positive breath sounds bilaterally; Gastrointestinal. Abdomen with, no organomegaly, non tender, no rebound or guarding Skin. No rashes Musculoskeletal: no joint deformities     Data Reviewed: I have personally reviewed following labs and imaging studies  CBC: Recent Labs  Lab 09/27/19 0427 09/28/19 0558 09/29/19 0423 09/30/19 0411 10/01/19 0046  WBC 9.4 9.2 7.9 5.9 4.7  NEUTROABS 7.8* 7.7 5.9 3.9 2.8  HGB 9.4* 9.0* 9.3* 9.5* 9.7*  HCT 25.9* 25.9* 26.1* 26.8* 27.7*  MCV 95.9 100.0 97.8 96.8 97.2  PLT 135* 117* 150 142* 142*   Basic Metabolic Panel: Recent Labs  Lab 09/27/19 0427  09/27/19 0427 09/27/19 1445 09/28/19 0426 09/29/19 0423 09/30/19 0411 10/01/19 0043 10/01/19 0046  NA 135  --   --  136 141 143  --  141  K 3.3*   < > 5.1 4.4 3.5 3.0*  --  2.8*  CL 93*  --   --  96* 98 103  --  101  CO2 26  --   --  18* 29 28  --  32  GLUCOSE 169*  --   --  117* 122* 124*  --  133*  BUN 12  --   --  19 29* 33*  --  27*  CREATININE 1.56*  --   --  1.95* 2.21* 1.54*  --  1.22  CALCIUM 7.3*  --   --  7.0* 7.0* 6.8*  --  7.1*  MG 2.3  --   --  2.2 2.0 1.9 1.8  --   PHOS 4.7*  --   --   --  5.5* 3.0  --  1.9*   < > = values in this interval   not displayed.   GFR: Estimated Creatinine Clearance: 66.2 mL/min (by C-G formula based on SCr of 1.22 mg/dL). Liver Function Tests: Recent Labs  Lab 09/30/19 0411 10/01/19 0046  AST 3,019* 1,288*  ALT 975* 733*  ALKPHOS 32* 35*  BILITOT 3.5* 3.2*  PROT 5.0* 5.0*  ALBUMIN 2.1* 2.0*   No results for input(s): LIPASE, AMYLASE in the last 168 hours. No results for input(s): AMMONIA in the last 168 hours. Coagulation Profile: No results for input(s): INR, PROTIME in the last 168 hours. Cardiac Enzymes: No results for input(s): CKTOTAL, CKMB, CKMBINDEX, TROPONINI in the last 168 hours. BNP (last 3 results) No results for input(s): PROBNP in the last 8760 hours. HbA1C: No results for input(s): HGBA1C in the last 72 hours. CBG: Recent Labs  Lab 09/29/19 1633 09/29/19 1927 09/30/19 0024 09/30/19 0353 09/30/19 0731  GLUCAP 89 100* 94 124* 125*   Lipid Profile: No results for input(s): CHOL, HDL, LDLCALC, TRIG, CHOLHDL, LDLDIRECT in the last 72 hours. Thyroid Function Tests: No results for input(s): TSH, T4TOTAL, FREET4, T3FREE, THYROIDAB in the last 72 hours. Anemia Panel: No results for input(s): VITAMINB12, FOLATE, FERRITIN, TIBC, IRON, RETICCTPCT in the last 72 hours.    Radiology Studies: I have reviewed all of the imaging during this hospital visit personally     Scheduled Meds: . chlorhexidine  gluconate (MEDLINE KIT)  15 mL Mouth Rinse BID  . Chlorhexidine Gluconate Cloth  6 each Topical Q0600  . mouth rinse  15 mL Mouth Rinse BID  . nicotine  21 mg Transdermal Daily  . pantoprazole  40 mg Intravenous Q12H   Continuous Infusions: . sodium chloride 10 mL/hr at 09/25/19 1600     LOS: 8 days        Ariana Juul Gerome Apley, MD

## 2019-10-03 NOTE — Progress Notes (Signed)
Judyann Munson, RN from hospice house called for update and talked to wife.

## 2019-10-03 NOTE — Plan of Care (Signed)
  Problem: Education: Goal: Knowledge of General Education information will improve Description Including pain rating scale, medication(s)/side effects and non-pharmacologic comfort measures Outcome: Progressing   

## 2019-10-04 DIAGNOSIS — I5023 Acute on chronic systolic (congestive) heart failure: Secondary | ICD-10-CM

## 2019-10-04 DIAGNOSIS — I472 Ventricular tachycardia, unspecified: Secondary | ICD-10-CM

## 2019-10-04 MED ORDER — PANTOPRAZOLE SODIUM 40 MG IV SOLR: 40.0000 mg | Freq: Two times a day (BID) | INTRAVENOUS | Status: AC

## 2019-10-04 MED ORDER — ACETAMINOPHEN 650 MG RE SUPP: 650.0000 mg | Freq: Four times a day (QID) | RECTAL | 0 refills | Status: AC | PRN

## 2019-10-04 MED ORDER — NICOTINE 21 MG/24HR TD PT24: 21.0000 mg | MEDICATED_PATCH | Freq: Every day | TRANSDERMAL | 0 refills | Status: AC

## 2019-10-04 MED ORDER — ONDANSETRON HCL 4 MG/2ML IJ SOLN: 4.0000 mg | Freq: Four times a day (QID) | INTRAMUSCULAR | 0 refills | Status: AC | PRN

## 2019-10-04 MED ORDER — MORPHINE SULFATE (PF) 4 MG/ML IV SOLN: 4.0000 mg | INTRAVENOUS | 0 refills | Status: AC | PRN

## 2019-10-04 MED ORDER — DOCUSATE SODIUM 100 MG PO CAPS: 100.0000 mg | ORAL_CAPSULE | Freq: Two times a day (BID) | ORAL | 0 refills | Status: AC | PRN

## 2019-10-04 MED ORDER — GLYCOPYRROLATE 1 MG PO TABS: 1.0000 mg | ORAL_TABLET | ORAL | Status: AC | PRN

## 2019-10-04 MED ORDER — GLYCOPYRROLATE 0.2 MG/ML IJ SOLN: 0.2000 mg | INTRAMUSCULAR | Status: AC | PRN

## 2019-10-04 MED ORDER — BIOTENE DRY MOUTH MT LIQD: 15.0000 mL | OROMUCOSAL | Status: AC | PRN

## 2019-10-04 MED ORDER — LORAZEPAM 2 MG PO TABS: 2.0000 mg | ORAL_TABLET | ORAL | 0 refills | Status: AC | PRN

## 2019-10-04 MED ORDER — LORAZEPAM 2 MG/ML IJ SOLN: 2.0000 mg | INTRAMUSCULAR | 0 refills | Status: AC | PRN

## 2019-10-04 MED ORDER — HALOPERIDOL 0.5 MG PO TABS: 0.5000 mg | ORAL_TABLET | ORAL | Status: AC | PRN

## 2019-10-04 MED ORDER — HALOPERIDOL LACTATE 5 MG/ML IJ SOLN: 0.5000 mg | INTRAMUSCULAR | Status: AC | PRN

## 2019-10-04 MED ORDER — ACETAMINOPHEN 325 MG PO TABS: 650.0000 mg | ORAL_TABLET | Freq: Four times a day (QID) | ORAL | Status: AC | PRN

## 2019-10-04 MED ORDER — POLYVINYL ALCOHOL 1.4 % OP SOLN: 1.0000 [drp] | Freq: Four times a day (QID) | OPHTHALMIC | 0 refills | Status: AC | PRN

## 2019-10-04 NOTE — Discharge Summary (Signed)
Physician Discharge Summary  Alexander Welch BWG:[id number] DOB: 15-Mar-1961 DOA: 09/25/2019  PCP: Alexander Hartigan, MD  Admit date: 09/25/2019 Discharge date: 10/04/2019  Admitted From: Home Disposition: Hospice home  Recommendations for Outpatient Follow-up:  1. Follow up with PCP in 1-2 weeks 2. Please obtain BMP/CBC in one week 3. Please follow up on the following pending results: None  Discharge Condition: Guarded CODE STATUS: DNR Diet recommendation: Heart Healthy / Carb Modified / Regular / Dysphagia   Brief/Interim Summary: 59 year old male who presented after a syncope episode.He does have the significant past medical history for alcohol abuse, ischemic cardiomyopathy, systolic heart failure, hypertension, dyslipidemia, thoracic aortic aneurysm, tobacco disorder, superior mesenteric artery stenosis and peripheral artery disease.  Patient was sitting in the couch, watching TV when suddenly developed episode of intractable nausea and vomiting, posteriorly he lost his consciousness. EMS was called and patient was brought to the hospital, on his initial physical examination his blood pressure was 77/58, heart rate 138 to 144 bpm,respiratory rate 22, oxygen saturation 94%. His lungs are clear to auscultation bilaterally, heart S1-S2, present rhythmic, soft abdomen, no significant lower extremity edema, he was awake and alert. Sodium 132, potassium less than 2.0, chloride 82, bicarb 31, glucose 134, BUN 8, creatinine 1.38, calcium 8.7, AST 81, ALT 34,white count 8.7, hemoglobin 11.8, hematocrit 32.5, platelets 153.SARS COVID-19 negative. Alcohol level was 38. Chest radiograph had bilateral interstitial infiltrates. EKG 163 bpm, left axis deviation, left anterior fascicular block with interventricular conduction delay, atrial fibrillation rhythm, poor R wave progression, no significant ST segment or T wave changes.  Patient was admitted to the intensive care unit, he was placed  on vasopressors for his hypotension, received IV fluids and aggressive potassium correction.  Patient developed ventricular fibrillation cardiac arrest and received defibrillation with 200 J, recovering spontaneous circulation immediately.  He was placed on alcohol withdrawal protocol along with thiamine and multivitamins.  Further work-up revealed ejection fraction 20 to 25% with moderate to severe mitral regurgitation.  On June 22 patient had progressive clinical duration and was placed on invasive mechanical ventilation. The following day his CODE STATUS was changed to DNR by his family and he was transitioned to comfort measures due to poor prognosis.  On June 24 he self extubated.  Patient remained medically stable since then.  Had some AKI and shock liver.  He will be discharged to hospice home.  Discharge Diagnoses:  Principal Problem:   Acute on chronic systolic CHF (congestive heart failure) (HCC) Active Problems:   Alcohol abuse   Ventricular tachyarrhythmia (HCC)   Acute respiratory failure (HCC)   Atrial fibrillation with RVR (HCC)   Cardiac arrest (HCC)   Shock circulatory (HCC)   Shock liver   Hypotension   DNR (do not resuscitate)   Comfort measures only status   Palliative care by specialist   Advanced care planning/counseling discussion   Goals of care, counseling/discussion   Ventricular tachycardia [facility])  Discharge Instructions  Discharge Instructions    Diet - low sodium heart healthy   Complete by: As directed    Increase activity slowly   Complete by: As directed      Allergies as of 10/04/2019   No Known Allergies     Medication List    STOP taking these medications   aspirin EC 81 MG tablet   atorvastatin 10 MG tablet Commonly known as: Lipitor   carvedilol 3.125 MG tablet Commonly known as: COREG   cetirizine 10 MG tablet Commonly known as: ZYRTEC  clopidogrel 75 MG tablet Commonly known as: Plavix   fenofibrate 160 MG  tablet   ferrous sulfate 325 (65 FE) MG tablet   folic acid 1 MG tablet Commonly known as: FOLVITE   furosemide 20 MG tablet Commonly known as: LASIX   gabapentin 300 MG capsule Commonly known as: NEURONTIN   PARoxetine 10 MG tablet Commonly known as: PAXIL   potassium chloride SA 20 MEQ tablet Commonly known as: KLOR-CON   sacubitril-valsartan 49-51 MG Commonly known as: ENTRESTO   sildenafil 20 MG tablet Commonly known as: REVATIO   spironolactone 25 MG tablet Commonly known as: ALDACTONE   vitamin B-12 1000 MCG tablet Commonly known as: CYANOCOBALAMIN     TAKE these medications   acetaminophen 325 MG tablet Commonly known as: TYLENOL Take 2 tablets (650 mg total) by mouth every 6 (six) hours as needed for mild pain (or Fever >/= 101).   acetaminophen 650 MG suppository Commonly known as: TYLENOL Place 1 suppository (650 mg total) rectally every 6 (six) hours as needed for mild pain (or Fever >/= 101).   antiseptic oral rinse Liqd Apply 15 mLs topically as needed for dry mouth.   docusate sodium 100 MG capsule Commonly known as: COLACE Take 1 capsule (100 mg total) by mouth 2 (two) times daily as needed for mild constipation.   glycopyrrolate 1 MG tablet Commonly known as: ROBINUL Take 1 tablet (1 mg total) by mouth every 4 (four) hours as needed (excessive secretions).   glycopyrrolate 0.2 MG/ML injection Commonly known as: ROBINUL Inject 1 mL (0.2 mg total) into the skin every 4 (four) hours as needed (excessive secretions).   haloperidol 0.5 MG tablet Commonly known as: HALDOL Take 1 tablet (0.5 mg total) by mouth every 4 (four) hours as needed for agitation (or delirium).   haloperidol lactate 5 MG/ML injection Commonly known as: HALDOL Inject 0.1 mLs (0.5 mg total) into the vein every 4 (four) hours as needed (or delirium).   LORazepam 2 MG/ML injection Commonly known as: ATIVAN Inject 1 mL (2 mg total) into the vein every 4 (four) hours as  needed for anxiety.   LORazepam 2 MG tablet Commonly known as: ATIVAN Take 1 tablet (2 mg total) by mouth every 4 (four) hours as needed for anxiety.   morphine 4 MG/ML injection Inject 1 mL (4 mg total) into the vein every hour as needed for severe pain (DYSPNEA).   nicotine 21 mg/24hr patch Commonly known as: NICODERM CQ - dosed in mg/24 hours Place 1 patch (21 mg total) onto the skin daily. Start taking on: October 05, 2019   ondansetron 4 MG/2ML Soln injection Commonly known as: ZOFRAN Inject 2 mLs (4 mg total) into the vein every 6 (six) hours as needed for nausea.   pantoprazole 40 MG injection Commonly known as: PROTONIX Inject 40 mg into the vein every 12 (twelve) hours.   polyvinyl alcohol 1.4 % ophthalmic solution Commonly known as: LIQUIFILM TEARS Place 1 drop into both eyes 4 (four) times daily as needed for dry eyes.       No Known Allergies  Consultations:  Cardiology  Palliative care  Procedures/Studies: DG Chest Port 1 View  Result Date: 09/28/2019 CLINICAL DATA:  Intubation.  CHF EXAM: PORTABLE CHEST 1 VIEW COMPARISON:  09/27/2019 FINDINGS: Endotracheal tube has been placed in the interval and is in good position. NG tube in the stomach with the tip not visualized. Diffuse bilateral airspace disease appears unchanged. Bibasilar atelectasis also unchanged. No effusion. IMPRESSION:  Endotracheal tube in good position Diffuse bilateral airspace disease unchanged, most likely pulmonary edema versus pneumonia. Electronically Signed   By: Franchot Gallo M.D.   On: 09/28/2019 16:52   DG Chest Port 1 View  Result Date: 09/27/2019 CLINICAL DATA:  Abnormal respiration. EXAM: PORTABLE CHEST 1 VIEW COMPARISON:  Radiograph yesterday.  CT 03/25/2019 FINDINGS: Enteric tube remains in place. Cardiomegaly appears similar. Unchanged mediastinal contours. Slight worsening in bronchial and interstitial thickening from prior exam. Suspected small pleural effusions with minimal  fluid in the right minor fissure. No pneumothorax. IMPRESSION: 1. Slight worsening in pulmonary edema. Small pleural effusions. 2. Stable cardiomegaly. Electronically Signed   By: Keith Rake M.D.   On: 09/27/2019 21:56   DG Chest Port 1 View  Result Date: 09/26/2019 CLINICAL DATA:  Acute respiratory failure EXAM: PORTABLE CHEST 1 VIEW COMPARISON:  September 25, 2019 FINDINGS: The NG tube terminates below today's film. The cardiomediastinal silhouette is stable. Increased interstitial markings bilaterally are similar to mildly improved in the interval. Transcutaneous pacer leads are noted. No other acute abnormalities are identified. IMPRESSION: 1. Cardiomegaly and pulmonary edema persists, similar in the interval. 2. Support apparatus as above. Electronically Signed   By: Dorise Bullion III M.D   On: 09/26/2019 11:28   DG Chest Port 1 View  Result Date: 09/25/2019 CLINICAL DATA:  Cardiac arrest.  NG tube placement. EXAM: PORTABLE CHEST 1 VIEW COMPARISON:  Chest radiograph 08/10/2019. FINDINGS: Tip and side port of the enteric tube below the diaphragm in the stomach. Cardiomegaly. There is pulmonary edema. Suspected small right left pleural effusion. No evidence of pneumothorax. Multiple overlying monitoring devices in place. IMPRESSION: 1. Tip and side port of the enteric tube below the diaphragm in the stomach. 2. Cardiomegaly with pulmonary edema and small pleural effusions. Electronically Signed   By: Keith Rake M.D.   On: 09/25/2019 21:03   ECHOCARDIOGRAM COMPLETE  Result Date: 09/26/2019    ECHOCARDIOGRAM REPORT   Patient Name:   Alexander Welch Date of Exam: 09/26/2019 Medical Rec #:  [id number]        Height:       67.0 in Accession #:    [id number]       Weight:       163.1 lb Date of Birth:  1961/02/24        BSA:          1.855 m Patient Age:    31 years         BP:           108/87 mmHg Patient Gender: M                HR:           74 bpm. Exam Location:  [facility] Procedure: 2D Echo and  Intracardiac Opacification Agent Indications:     Cardiomyopathy  History:         Patient has no prior history of Echocardiogram examinations.                  CHF and Ischemic CM, Arrythmias:Atrial Fibrillation; Risk                  Factors:Hypertension, Dyslipidemia and Current Smoker.  Sonographer:     Carlean Jews Thornton-Maynard Referring Phys:  [id number] CARMEN L GONZALEZ Diagnosing Phys: Isaias Cowman MD IMPRESSIONS  1. Left ventricular ejection fraction, by estimation, is 20 to 25%. The left ventricle has severely decreased function. The left ventricle has no regional  wall motion abnormalities. The left ventricular internal cavity size was moderately dilated. Left ventricular diastolic parameters were normal.  2. Right ventricular systolic function is normal. The right ventricular size is normal. There is moderately elevated pulmonary artery systolic pressure.  3. The mitral valve is normal in structure. Moderate to severe mitral valve regurgitation. No evidence of mitral stenosis.  4. Tricuspid valve regurgitation is moderate to severe.  5. The aortic valve is normal in structure. Aortic valve regurgitation is mild to moderate. No aortic stenosis is present.  6. The inferior vena cava is normal in size with greater than 50% respiratory variability, suggesting right atrial pressure of 3 mmHg. FINDINGS  Left Ventricle: Left ventricular ejection fraction, by estimation, is 20 to 25%. The left ventricle has severely decreased function. The left ventricle has no regional wall motion abnormalities. Definity contrast agent was given IV to delineate the left  ventricular endocardial borders. The left ventricular internal cavity size was moderately dilated. There is no left ventricular hypertrophy. Left ventricular diastolic parameters were normal. Right Ventricle: The right ventricular size is normal. No increase in right ventricular wall thickness. Right ventricular systolic function is normal. There is moderately  elevated pulmonary artery systolic pressure. The tricuspid regurgitant velocity is 3.07 m/s, and with an assumed right atrial pressure of 10 mmHg, the estimated right ventricular systolic pressure is 27.7 mmHg. Left Atrium: Left atrial size was normal in size. Right Atrium: Right atrial size was normal in size. Pericardium: There is no evidence of pericardial effusion. Mitral Valve: The mitral valve is normal in structure. Normal mobility of the mitral valve leaflets. Moderate to severe mitral valve regurgitation. No evidence of mitral valve stenosis. Tricuspid Valve: The tricuspid valve is normal in structure. Tricuspid valve regurgitation is moderate to severe. No evidence of tricuspid stenosis. Aortic Valve: The aortic valve is normal in structure. Aortic valve regurgitation is mild to moderate. Aortic regurgitation PHT measures 454 msec. No aortic stenosis is present. Aortic valve mean gradient measures 4.0 mmHg. Aortic valve peak gradient measures 8.5 mmHg. Aortic valve area, by VTI measures 2.28 cm. Pulmonic Valve: The pulmonic valve was normal in structure. Pulmonic valve regurgitation is not visualized. No evidence of pulmonic stenosis. Aorta: The aortic root is normal in size and structure. Venous: The inferior vena cava is normal in size with greater than 50% respiratory variability, suggesting right atrial pressure of 3 mmHg. IAS/Shunts: No atrial level shunt detected by color flow Doppler.  LEFT VENTRICLE PLAX 2D LVIDd:         6.72 cm  Diastology LVIDs:         6.04 cm  LV e' lateral:   3.37 cm/s LV PW:         1.11 cm  LV E/e' lateral: 18.3 LV IVS:        0.80 cm  LV e' medial:    5.38 cm/s LVOT diam:     2.70 cm  LV E/e' medial:  11.5 LV SV:         48 LV SV Index:   26 LVOT Area:     5.73 cm  RIGHT VENTRICLE RV S prime:     12.10 cm/s TAPSE (M-mode): 2.4 cm LEFT ATRIUM             Index LA diam:        3.40 cm 1.83 cm/m LA Vol (A2C):   45.8 ml 24.70 ml/m LA Vol (A4C):   51.9 ml 27.99 ml/m LA  Biplane Vol: 51.8  ml 27.93 ml/m  AORTIC VALVE AV Area (Vmax):    1.76 cm AV Area (Vmean):   1.85 cm AV Area (VTI):     2.28 cm AV Vmax:           146.00 cm/s AV Vmean:          97.700 cm/s AV VTI:            0.212 m AV Peak Grad:      8.5 mmHg AV Mean Grad:      4.0 mmHg LVOT Vmax:         45.00 cm/s LVOT Vmean:        31.600 cm/s LVOT VTI:          0.084 m LVOT/AV VTI ratio: 0.40 AI PHT:            454 msec  AORTA Ao Root diam: 3.70 cm MV E velocity: 61.70 cm/s  TRICUSPID VALVE MV A velocity: 45.40 cm/s  TR Peak grad:   37.7 mmHg MV E/A ratio:  1.36        TR Vmax:        307.00 cm/s                             SHUNTS                            Systemic VTI:  0.08 m                            Systemic Diam: 2.70 cm Isaias Cowman MD Electronically signed by Isaias Cowman MD Signature Date/Time: 09/26/2019/10:56:22 AM    Final      Subjective: Patient has no new complaints today.  He was alert and oriented.  He said he did eat some breakfast today.  Denies any pain, or shortness of breath.  Discharge Exam: Vitals:   10/04/19 0247 10/04/19 0746  BP: 124/83 119/80  Pulse: 83 79  Resp: (!) 22 16  Temp: 97.7 F (36.5 C) (!) 97.4 F (36.3 C)  SpO2: (!) 82% 90%   Vitals:   10/03/19 0608 10/03/19 0737 10/04/19 0247 10/04/19 0746  BP: 137/85 125/77 124/83 119/80  Pulse: 84 80 83 79  Resp: 17 18 (!) 22 16  Temp: 97.8 F (36.6 C) 98.1 F (36.7 C) 97.7 F (36.5 C) (!) 97.4 F (36.3 C)  TempSrc: Oral Oral Oral Oral  SpO2: 91% (!) 77% (!) 82% 90%  Weight:      Height:        General: Pt is alert, awake, not in acute distress Cardiovascular: RRR, S1/S2 +, no rubs, no gallops Respiratory: CTA bilaterally, no wheezing, no rhonchi Abdominal: Soft, NT, ND, bowel sounds + Extremities: no edema, no cyanosis   The results of significant diagnostics from this hospitalization (including imaging, microbiology, ancillary and laboratory) are listed below for reference.     Microbiology: Recent Results (from the past 240 hour(s))  MRSA PCR Screening     Status: None   Collection Time: 09/25/19  6:07 AM   Specimen: Nasopharyngeal  Result Value Ref Range Status   MRSA by PCR NEGATIVE NEGATIVE Final    Comment:        The GeneXpert MRSA Assay (FDA approved for NASAL specimens only), is one component of a comprehensive MRSA colonization surveillance program. It is not  intended to diagnose MRSA infection nor to guide or monitor treatment for MRSA infections. Performed at [facility], [address] [place], [place] [address]   SARS Coronavirus 2 by RT PCR (hospital order, performed in [facility] hospital lab) Nasopharyngeal Nasopharyngeal Swab     Status: None   Collection Time: 09/25/19  9:00 AM   Specimen: Nasopharyngeal Swab  Result Value Ref Range Status   SARS Coronavirus 2 NEGATIVE NEGATIVE Final    Comment: (NOTE) SARS-CoV-2 target nucleic acids are NOT DETECTED.  The SARS-CoV-2 RNA is generally detectable in upper and lower respiratory specimens during the acute phase of infection. The lowest concentration of SARS-CoV-2 viral copies this assay can detect is 250 copies / mL. A negative result does not preclude SARS-CoV-2 infection and should not be used as the sole basis for treatment or other patient management decisions.  A negative result may occur with improper specimen collection / handling, submission of specimen other than nasopharyngeal swab, presence of viral mutation(s) within the areas targeted by this assay, and inadequate number of viral copies (<250 copies / mL). A negative result must be combined with clinical observations, patient history, and epidemiological information.  Fact Sheet for Patients:   StrictlyIdeas.no  Fact Sheet for Healthcare Providers: BankingDealers.co.za  This test is not yet approved or  cleared by the [place] FDA and has been  authorized for detection and/or diagnosis of SARS-CoV-2 by FDA under an Emergency Use Authorization (EUA).  This EUA will remain in effect (meaning this test can be used) for the duration of the COVID-19 declaration under Section 564(b)(1) of the Act, 21 U.S.C. section 360bbb-3(b)(1), unless the authorization is terminated or revoked sooner.  Performed at [facility], [address] [place], [place] [address]   CULTURE, BLOOD (ROUTINE X 2) w Reflex to ID Panel     Status: None   Collection Time: 09/27/19  9:34 AM   Specimen: BLOOD  Result Value Ref Range Status   Specimen Description BLOOD LEFTWRIST  Final   Special Requests   Final    BOTTLES DRAWN AEROBIC AND ANAEROBIC Blood Culture adequate volume   Culture   Final    NO GROWTH 5 DAYS Performed at [facility], [address] [place], [place] [address]    Report Status 10/02/2019 FINAL  Final  CULTURE, BLOOD (ROUTINE X 2) w Reflex to ID Panel     Status: None   Collection Time: 09/27/19  9:39 AM   Specimen: BLOOD  Result Value Ref Range Status   Specimen Description BLOOD LINEDRAW  Final   Special Requests   Final    BOTTLES DRAWN AEROBIC AND ANAEROBIC Blood Culture adequate volume   Culture   Final    NO GROWTH 5 DAYS Performed at [facility], [address] [place], [place] [address]    Report Status 10/02/2019 FINAL  Final     Labs: BNP (last 3 results) Recent Labs    08/10/19 2310 09/26/19 0602  BNP 56.0 2,595.6*   Basic Metabolic Panel: Recent Labs  Lab 09/27/19 1445 09/28/19 0426 09/29/19 0423 09/30/19 0411 10/01/19 0043 10/01/19 0046  NA  --  136 141 143  --  141  K 5.1 4.4 3.5 3.0*  --  2.8*  CL  --  96* 98 103  --  101  CO2  --  18* 29 28  --  32  GLUCOSE  --  117* 122* 124*  --  133*  BUN  --  19 29* 33*  --  27*  CREATININE  --  1.95* 2.21* 1.54*  --  1.22  CALCIUM  --  7.0* 7.0* 6.8*  --  7.1*  MG  --  2.2 2.0 1.9 1.8  --   PHOS  --   --  5.5* 3.0  --   1.9*   Liver Function Tests: Recent Labs  Lab 09/30/19 0411 10/01/19 0046  AST 3,019* 1,288*  ALT 975* 733*  ALKPHOS 32* 35*  BILITOT 3.5* 3.2*  PROT 5.0* 5.0*  ALBUMIN 2.1* 2.0*   No results for input(s): LIPASE, AMYLASE in the last 168 hours. No results for input(s): AMMONIA in the last 168 hours. CBC: Recent Labs  Lab 09/28/19 0558 09/29/19 0423 09/30/19 0411 10/01/19 0046  WBC 9.2 7.9 5.9 4.7  NEUTROABS 7.7 5.9 3.9 2.8  HGB 9.0* 9.3* 9.5* 9.7*  HCT 25.9* 26.1* 26.8* 27.7*  MCV 100.0 97.8 96.8 97.2  PLT 117* 150 142* 142*   Cardiac Enzymes: No results for input(s): CKTOTAL, CKMB, CKMBINDEX, TROPONINI in the last 168 hours. BNP: Invalid input(s): POCBNP CBG: Recent Labs  Lab 09/29/19 1633 09/29/19 1927 09/30/19 0024 09/30/19 0353 09/30/19 0731  GLUCAP 89 100* 94 124* 125*   D-Dimer No results for input(s): DDIMER in the last 72 hours. Hgb A1c No results for input(s): HGBA1C in the last 72 hours. Lipid Profile No results for input(s): CHOL, HDL, LDLCALC, TRIG, CHOLHDL, LDLDIRECT in the last 72 hours. Thyroid function studies No results for input(s): TSH, T4TOTAL, T3FREE, THYROIDAB in the last 72 hours.  Invalid input(s): FREET3 Anemia work up No results for input(s): VITAMINB12, FOLATE, FERRITIN, TIBC, IRON, RETICCTPCT in the last 72 hours. Urinalysis    Component Value Date/Time   COLORURINE YELLOW (A) 10/24/2017 1150   APPEARANCEUR CLEAR (A) 10/24/2017 1150   LABSPEC 1.008 10/24/2017 1150   PHURINE 6.0 10/24/2017 1150   GLUCOSEU NEGATIVE 10/24/2017 1150   HGBUR NEGATIVE 10/24/2017 1150   BILIRUBINUR NEGATIVE 10/24/2017 1150   KETONESUR NEGATIVE 10/24/2017 1150   PROTEINUR NEGATIVE 10/24/2017 1150   NITRITE NEGATIVE 10/24/2017 1150   LEUKOCYTESUR NEGATIVE 10/24/2017 1150   Sepsis Labs Invalid input(s): PROCALCITONIN,  WBC,  LACTICIDVEN Microbiology Recent Results (from the past 240 hour(s))  MRSA PCR Screening     Status: None   Collection  Time: 09/25/19  6:07 AM   Specimen: Nasopharyngeal  Result Value Ref Range Status   MRSA by PCR NEGATIVE NEGATIVE Final    Comment:        The GeneXpert MRSA Assay (FDA approved for NASAL specimens only), is one component of a comprehensive MRSA colonization surveillance program. It is not intended to diagnose MRSA infection nor to guide or monitor treatment for MRSA infections. Performed at [facility], [address] [place], [place] [address]   SARS Coronavirus 2 by RT PCR (hospital order, performed in [facility] hospital lab) Nasopharyngeal Nasopharyngeal Swab     Status: None   Collection Time: 09/25/19  9:00 AM   Specimen: Nasopharyngeal Swab  Result Value Ref Range Status   SARS Coronavirus 2 NEGATIVE NEGATIVE Final    Comment: (NOTE) SARS-CoV-2 target nucleic acids are NOT DETECTED.  The SARS-CoV-2 RNA is generally detectable in upper and lower respiratory specimens during the acute phase of infection. The lowest concentration of SARS-CoV-2 viral copies this assay can detect is 250 copies / mL. A negative result does not preclude SARS-CoV-2 infection and should not be used as the sole basis for treatment or  other patient management decisions.  A negative result may occur with improper specimen collection / handling, submission of specimen other than nasopharyngeal swab, presence of viral mutation(s) within the areas targeted by this assay, and inadequate number of viral copies (<250 copies / mL). A negative result must be combined with clinical observations, patient history, and epidemiological information.  Fact Sheet for Patients:   StrictlyIdeas.no  Fact Sheet for Healthcare Providers: BankingDealers.co.za  This test is not yet approved or  cleared by the [place] FDA and has been authorized for detection and/or diagnosis of SARS-CoV-2 by FDA under an Emergency Use Authorization (EUA).  This EUA  will remain in effect (meaning this test can be used) for the duration of the COVID-19 declaration under Section 564(b)(1) of the Act, 21 U.S.C. section 360bbb-3(b)(1), unless the authorization is terminated or revoked sooner.  Performed at [facility], [address] [place], [place] [address]   CULTURE, BLOOD (ROUTINE X 2) w Reflex to ID Panel     Status: None   Collection Time: 09/27/19  9:34 AM   Specimen: BLOOD  Result Value Ref Range Status   Specimen Description BLOOD LEFTWRIST  Final   Special Requests   Final    BOTTLES DRAWN AEROBIC AND ANAEROBIC Blood Culture adequate volume   Culture   Final    NO GROWTH 5 DAYS Performed at [facility], [address] [place], [place] [address]    Report Status 10/02/2019 FINAL  Final  CULTURE, BLOOD (ROUTINE X 2) w Reflex to ID Panel     Status: None   Collection Time: 09/27/19  9:39 AM   Specimen: BLOOD  Result Value Ref Range Status   Specimen Description BLOOD LINEDRAW  Final   Special Requests   Final    BOTTLES DRAWN AEROBIC AND ANAEROBIC Blood Culture adequate volume   Culture   Final    NO GROWTH 5 DAYS Performed at [facility] [facility], [address], [place], [place] [address]    Report Status 10/02/2019 FINAL  Final    Time coordinating discharge: Over 30 minutes  SIGNED:  Lorella Nimrod, MD  [facility] 10/04/2019, 11:22 AM  If 7PM-7AM, please contact night-coverage [email or web address]  This record has been created using Systems analyst. Errors have been sought and corrected,but may not always be located. Such creation errors do not reflect on the standard of care.

## 2019-10-04 NOTE — Progress Notes (Signed)
[facility] Liaison note:  Manufacturing engineer is able to offer a bed at the hospice home today.  Patient's wife [place] and hospital care team made aware. Writer to call report and set up 5 pm transport. Will continue to follow through discharge.  Thank you.  Flo Shanks BSN, RN, [place] [phone]

## 2019-10-04 NOTE — Progress Notes (Signed)
Daily Progress Note   Patient Name: Alexander Welch       Date: 10/04/2019 DOB: 1960/09/24  Age: 59 y.o. MRN#: [id number] Attending Physician: Lorella Nimrod, MD Primary Care Physician: Sofie Hartigan, MD Admit Date: 09/25/2019  Reason for Consultation/Follow-up: Establishing goals of care  Subjective: Patient sleeping. [place]. Appears comfortable with no complaints. Stable for transfer to Hospice facility when bed is available.   Review of Systems  Unable to perform ROS: Acuity of condition    Length of Stay: 9  Current Medications: Scheduled Meds:   chlorhexidine gluconate (MEDLINE KIT)  15 mL Mouth Rinse BID   Chlorhexidine Gluconate Cloth  6 each Topical Q0600   mouth rinse  15 mL Mouth Rinse BID   nicotine  21 mg Transdermal Daily   pantoprazole  40 mg Intravenous Q12H    Continuous Infusions:  sodium chloride 10 mL/hr at 09/25/19 1600    PRN Meds: acetaminophen **OR** acetaminophen, antiseptic oral rinse, docusate sodium, glycopyrrolate **OR** glycopyrrolate **OR** glycopyrrolate, haloperidol **OR** haloperidol **OR** haloperidol lactate, LORazepam **OR** LORazepam **OR** LORazepam, morphine injection, ondansetron (ZOFRAN) IV, polyvinyl alcohol  Physical Exam Vitals and nursing note reviewed.  Constitutional:      General: He is not in acute distress.    Appearance: He is ill-appearing.  Cardiovascular:     Comments: diffuse anasarca Skin:    General: Skin is warm and dry.  Neurological:     Mental Status: He is disoriented.             Vital Signs: BP 119/80 (BP Location: Left Arm)    Pulse 79    Temp (!) 97.4 F (36.3 C) (Oral)    Resp 16    Ht _0  (1.702 m)    Wt 82.7 kg    SpO2 90%    BMI 28.56 kg/m  SpO2: SpO2: 90 % O2 Device: O2 Device: Room  Air (pt refusing [place]) O2 Flow Rate: O2 Flow Rate (L/min): 4 L/min  Intake/output summary:   Intake/Output Summary (Last 24 hours) at 10/04/2019 1038 Last data filed at 10/04/2019 1007 Gross per 24 hour  Intake 240 ml  Output 500 ml  Net -260 ml   LBM: Last BM Date: 10/03/19 Baseline Weight: Weight: 68 kg Most recent weight: Weight: 82.7 kg       Palliative  Assessment/Data: PPS: 20%      Patient Active Problem List   Diagnosis Date Noted   Acute on chronic systolic CHF (congestive heart failure) ([place]) 10/02/2019   Cardiac arrest [facility])    Shock circulatory ([place])    Shock liver    Hypotension    DNR (do not resuscitate)    Comfort measures only status    Palliative care by specialist    Advanced care planning/counseling discussion    Goals of care, counseling/discussion    Acute respiratory failure ([place])    Atrial fibrillation with RVR ([place])    Ventricular tachyarrhythmia ([place]) 09/25/2019   Lymphedema 07/27/2019   PAD (peripheral artery disease) ([place]) 06/21/2019   Sleeping difficulty 05/24/2019   Angina at rest ([place]) 05/13/2019   Dyspnea on exertion 05/13/2019   Excessive weight loss 05/13/2019   Left arm pain 05/13/2019   Superior mesenteric artery stenosis ([place]) 04/27/2019   Tobacco use disorder 04/27/2019   Thoracic aortic aneurysm ([place]) 04/27/2019   Atherosclerosis of native arteries of extremity with intermittent claudication ([place]) 04/27/2019   Difficulty balancing 03/23/2019   Bilateral arm weakness 03/09/2019   Bilateral hand numbness 03/09/2019   Neck pain 03/09/2019   Other symptoms and signs involving the musculoskeletal system 02/17/2019   Tremor 02/17/2019   Abnormal CT scan, colon 11/27/2017   Alcohol abuse 11/27/2017   History of colon polyps 11/27/2017   Low serum alkaline phosphatase 11/27/2017   Hyponatremia 10/24/2017   Primary osteoarthritis of left knee 07/24/2017   Cardiomyopathy ([place]) 12/08/2013   ED  (erectile dysfunction) 12/08/2013   Hypertension 12/08/2013   Hypertriglyceridemia 12/08/2013    Palliative Care Assessment & Plan   Patient Profile:  59 y.o. male  with past medical history of ischemic cardiomyopathy, HTN, thoracic aortic aneurysm, PVD s/p lower extremity revascularization, CHF with most recent ECHO showing EF 16% (complicated by history of heavy alcohol use) admitted on 09/25/2019 with syncope and vomiting. Workup revealed CHF exacerbation, and significant electrolyte abnormalities. He developed torsades and coded. Was resuscitated- however, continued to require pressors and on 6/22 had worsening lactate and pulmonary edema and was intubated. 6/23 discussion was had with family members with decisions to extubated to comfort on 6/24. He self extubated on 6/24. He remains hypotensive, confused. Liver enzymes are significantly elevated likely due to circulatory shock. Palliative medicine consulted for goals of care.  Assessment/Recommendations/Plan  Comfortable - - continue comfort measures as ordered Awaiting placement at [facility] for transfer  Goals of Care and Additional Recommendations: Limitations on Scope of Treatment: Full Comfort Care  Code Status: DNR  Prognosis:  < 2 weeks  Discharge Planning: Hospice facility   Thank you for allowing the Palliative Medicine Team to assist in the care of this patient.   Time In: 1000 Time Out: 1015 Total Time 15 mins Prolonged Time Billed no      Greater than 50%  of this time was spent counseling and coordinating care related to the above assessment and plan.  Mariana Kaufman, AGNP-C Palliative Medicine   Please contact Palliative Medicine Team phone at [phone] for questions and concerns.

## 2019-11-07 DEATH — deceased

## 2020-01-28 ENCOUNTER — Ambulatory Visit (INDEPENDENT_AMBULATORY_CARE_PROVIDER_SITE_OTHER): Payer: BC Managed Care – PPO | Admitting: Vascular Surgery

## 2020-01-28 ENCOUNTER — Encounter (INDEPENDENT_AMBULATORY_CARE_PROVIDER_SITE_OTHER): Payer: BC Managed Care – PPO

## 2020-06-02 IMAGING — CT CT HEAD W/O CM
3 series · 16 of 47 positions shown, 19 images · non-contrast
Comparison: None.

CLINICAL DATA: Recent fall with facial laceration, initial
encounter

EXAM:
CT HEAD WITHOUT CONTRAST
TECHNIQUE: Contiguous axial images were obtained from the base of the skull
through the vertex without intravenous contrast.

[Series 2: head wo · axial · 0.46mm/px · z∈[-123,+2]mm · 10 of 30 slices shown, 13 images]
[im 3/30  brain]
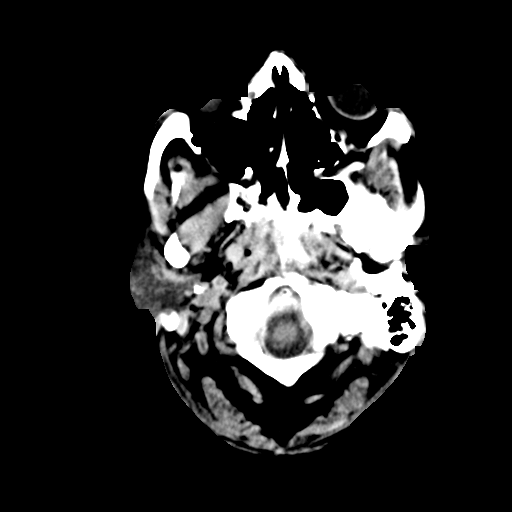
[im 3/30  bone]
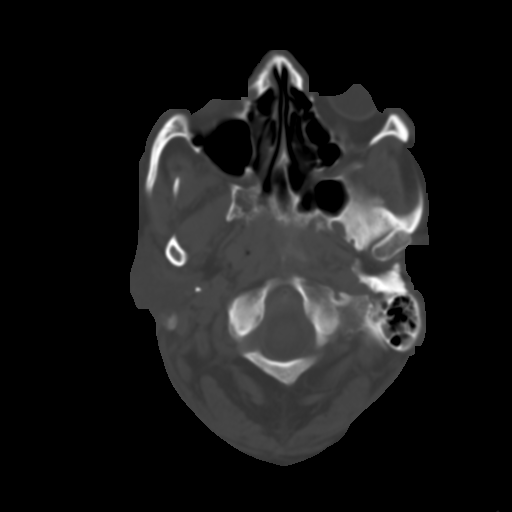
[im 6/30  brain]
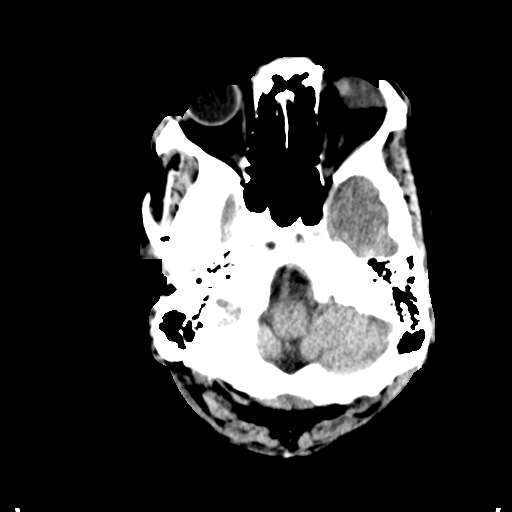
[im 9/30  brain]
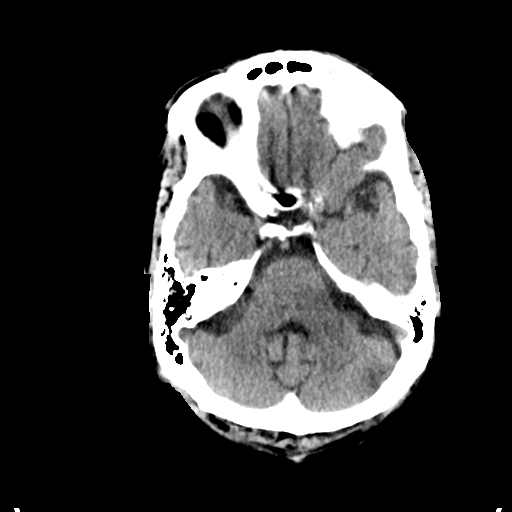
[im 11/30  brain]
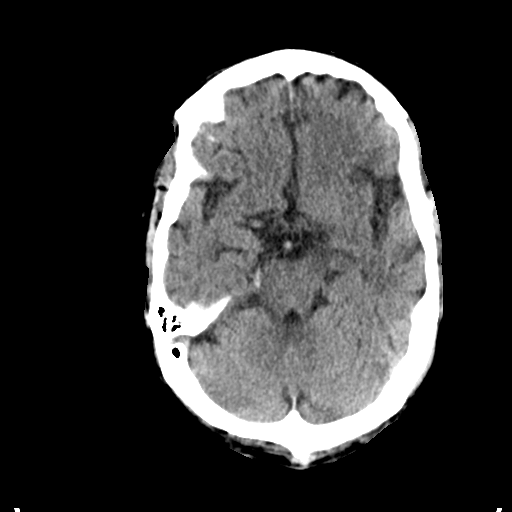
[im 14/30  brain]
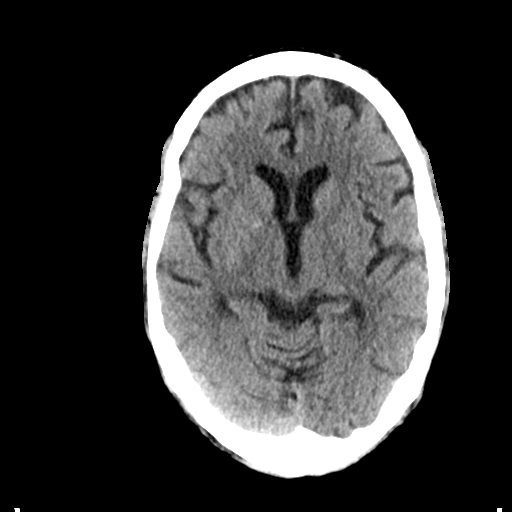
[im 14/30  bone]
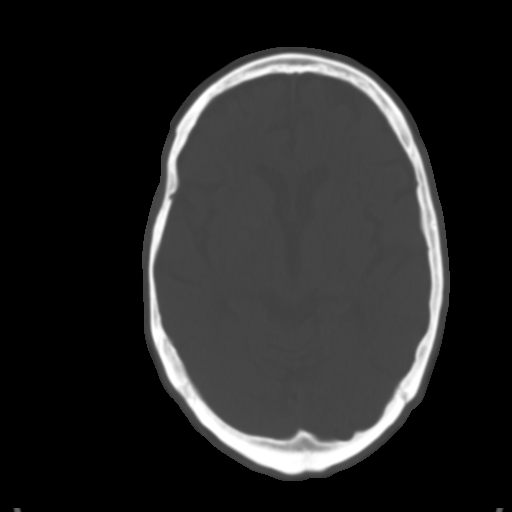
[im 17/30  brain]
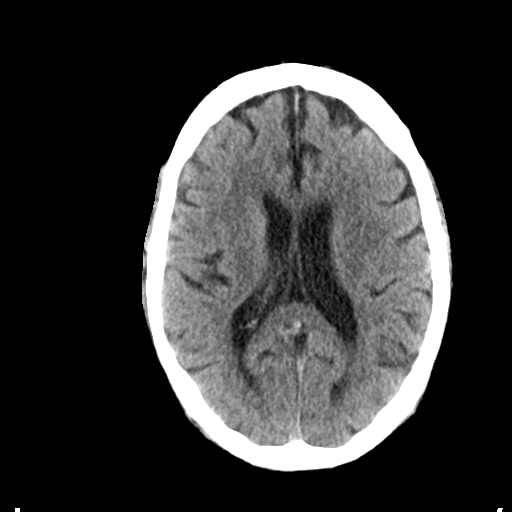
[im 20/30  brain]
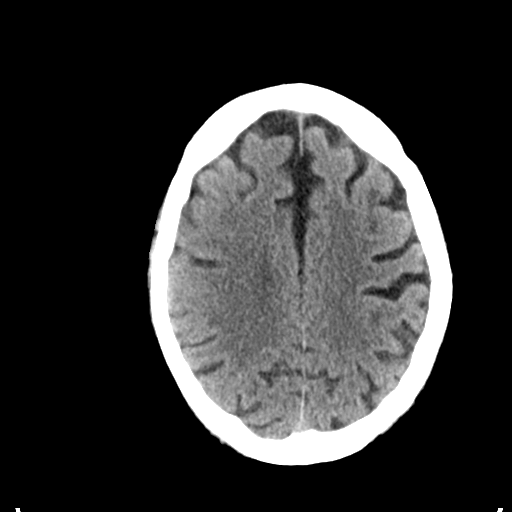
[im 23/30  brain]
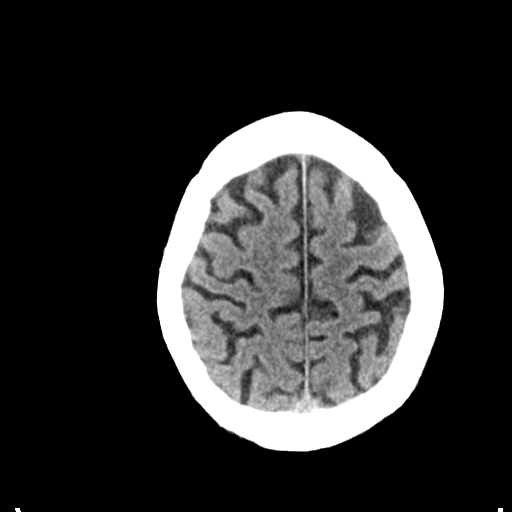
[im 25/30  brain]
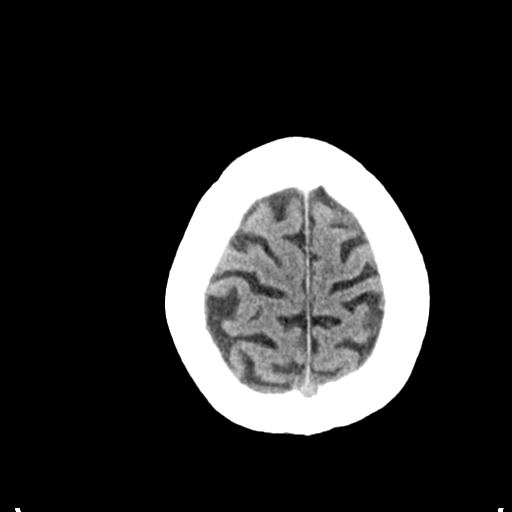
[im 25/30  bone]
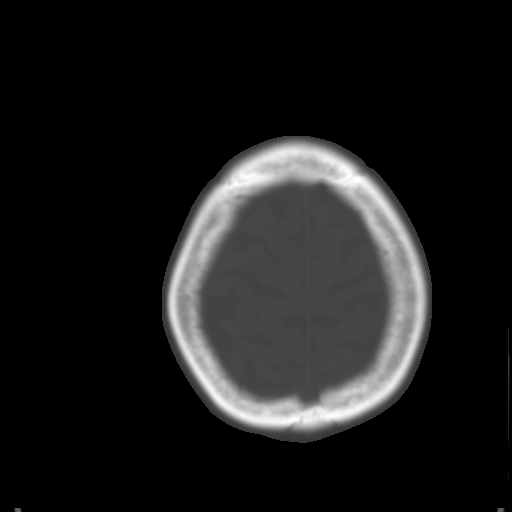
[im 28/30  brain]
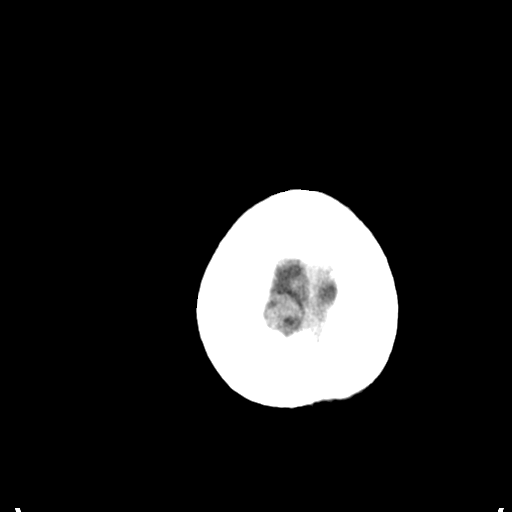

[Series 4: coronal soft tissue · coronal · 0.32mm/px · 3 of 67 slices shown]
[im 23/67  brain]
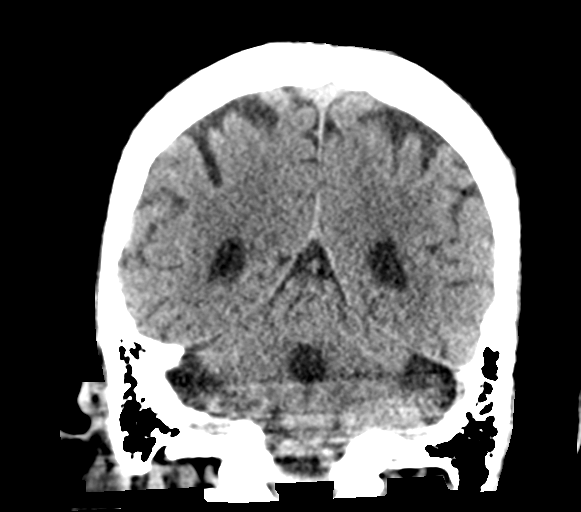
[im 30/67  brain]
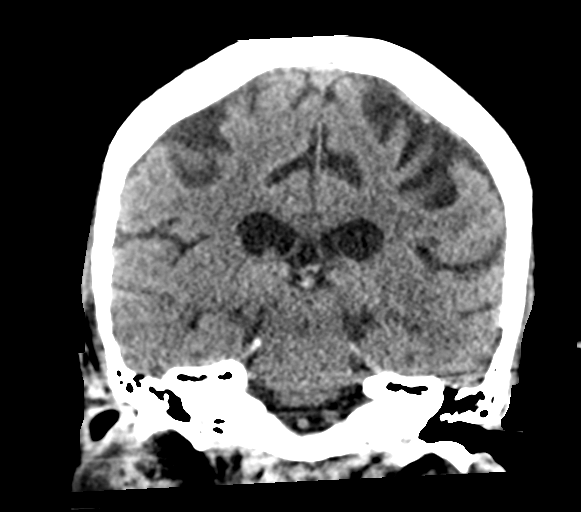
[im 37/67  brain]
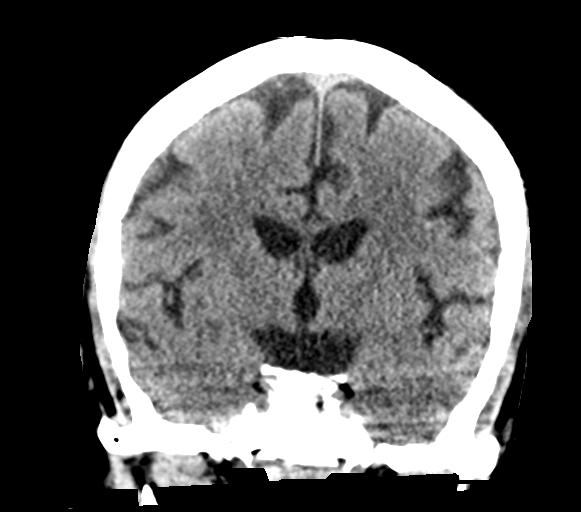

[Series 5: sagittal soft tissue · sagittal · 0.32mm/px · 3 of 67 slices shown]
[im 23/67  brain]
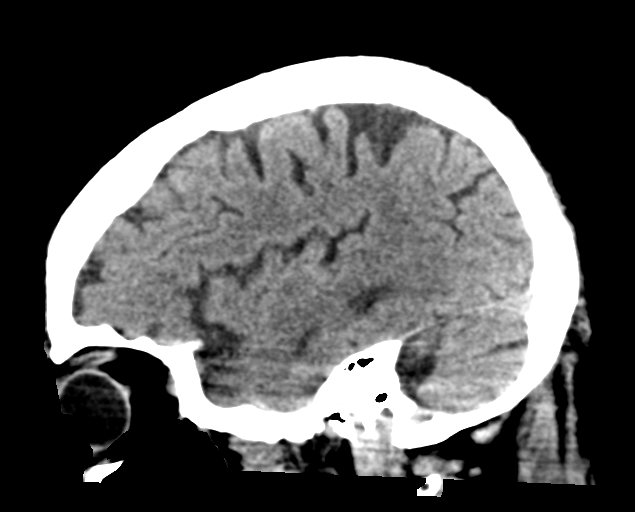
[im 34/67  brain]
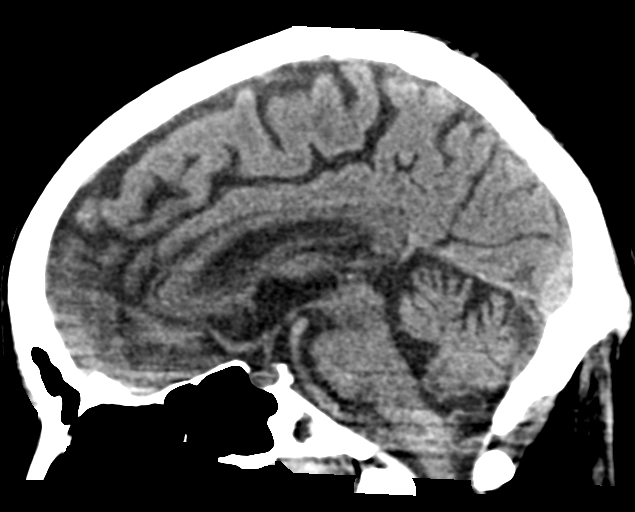
[im 45/67  brain]
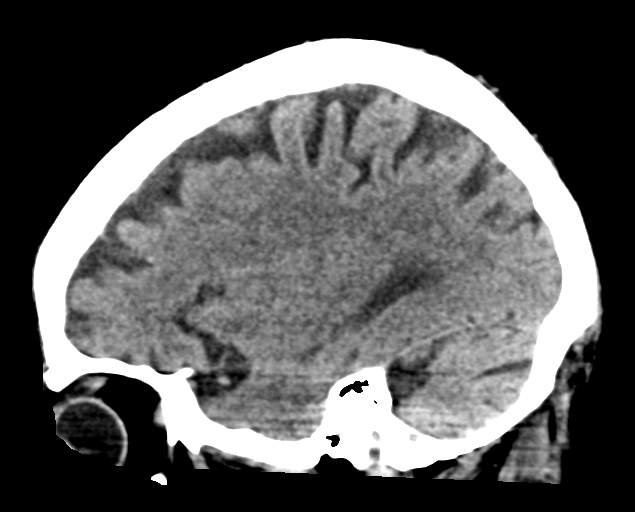

[16 of 47 positions shown; findings below may reference images not displayed]

FINDINGS: Brain: No evidence of acute infarction, hemorrhage, hydrocephalus,
extra-axial collection or mass lesion/mass effect. Mild atrophic
changes are noted commensurate with the patient's given age.
Bilateral basal ganglia calcifications are seen.

Vascular: No hyperdense vessel or unexpected calcification.

Skull: Normal. Negative for fracture or focal lesion.

Sinuses/Orbits: No acute finding.

Other: None.
IMPRESSION: Atrophic changes without acute abnormality.

## 2020-08-06 IMAGING — CR DG CHEST 2V
2 series · 2 of 2 positions shown · non-contrast
Comparison: Chest radiograph dated 10/24/2017.

CLINICAL DATA: 58-year-old male with bilateral leg swelling.

EXAM:
CHEST - 2 VIEW

[chest pa]
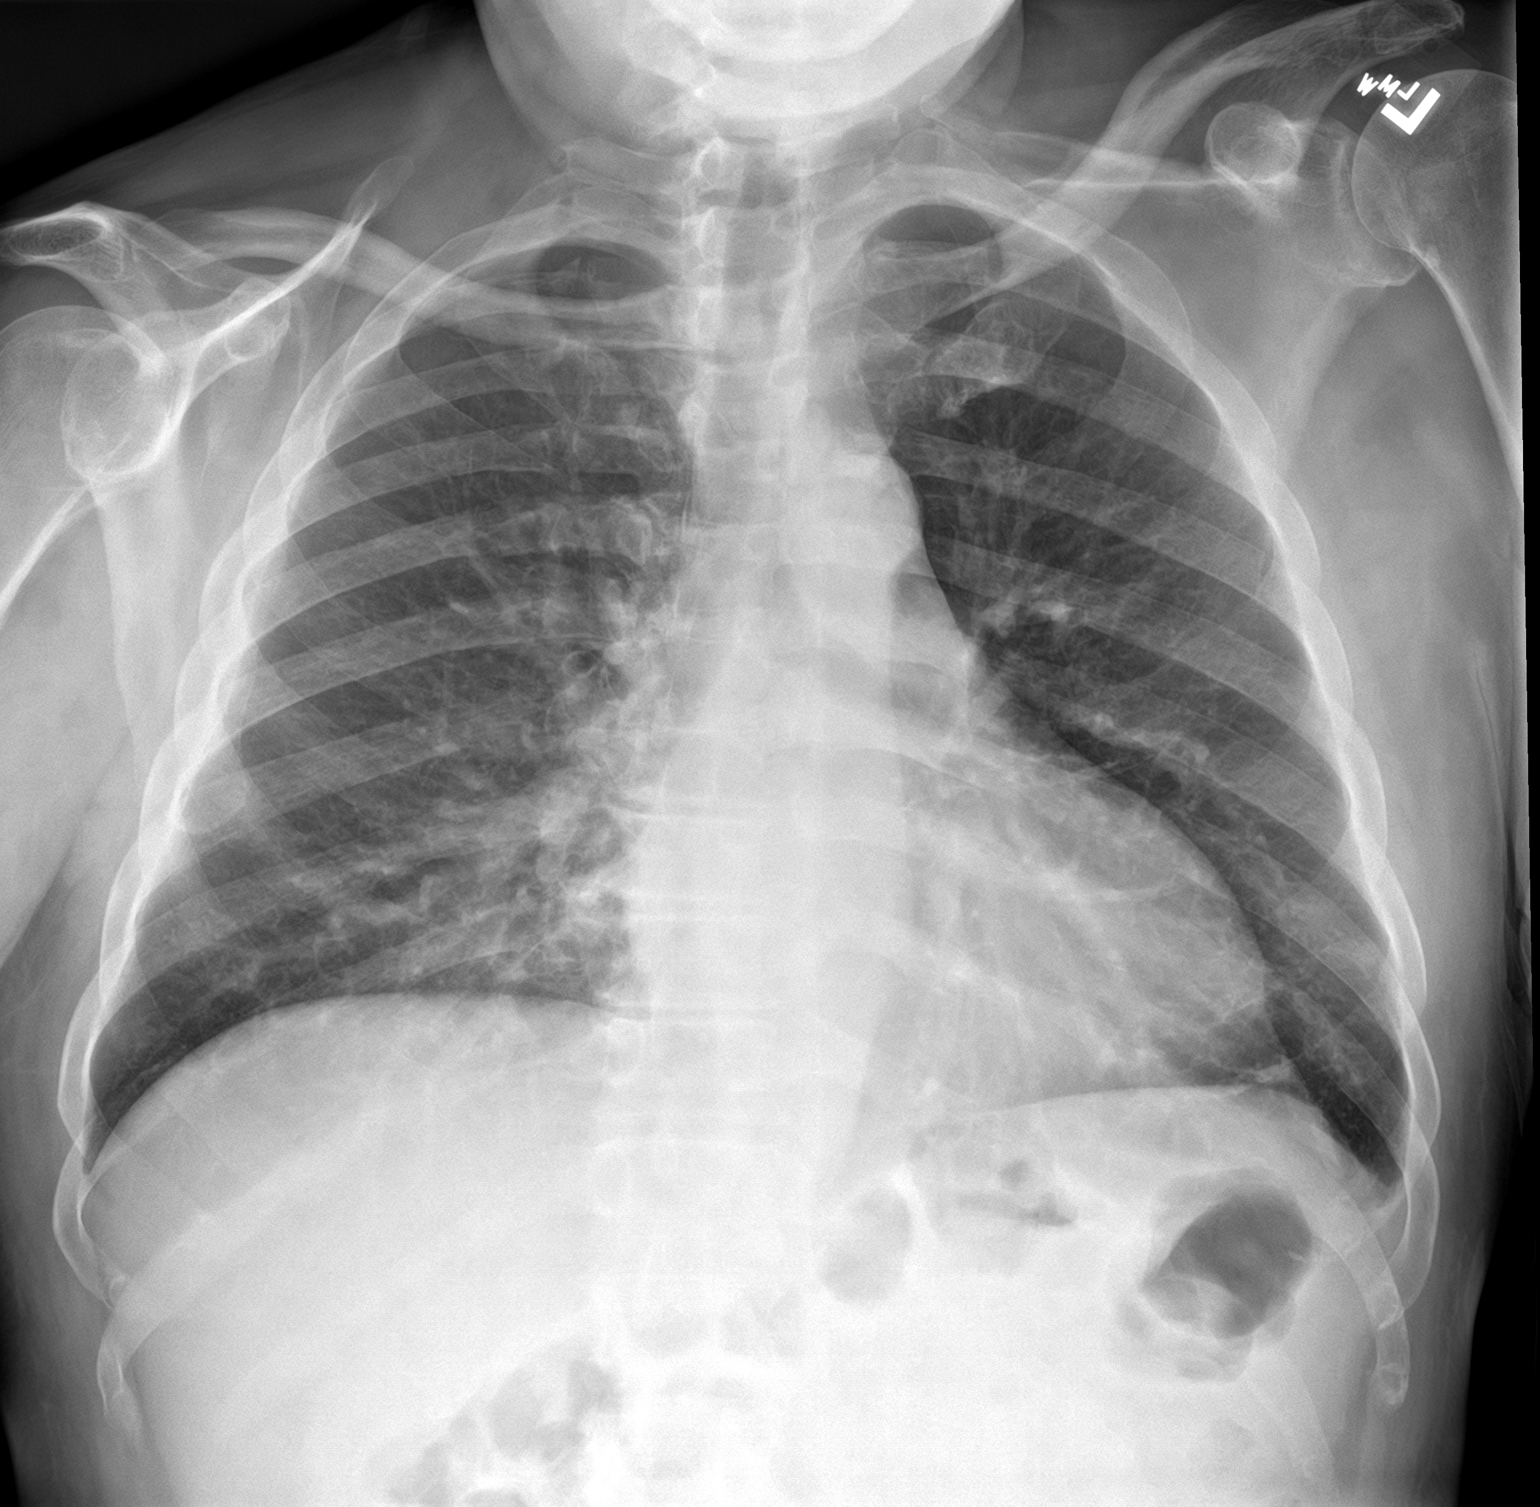

[chest lat]
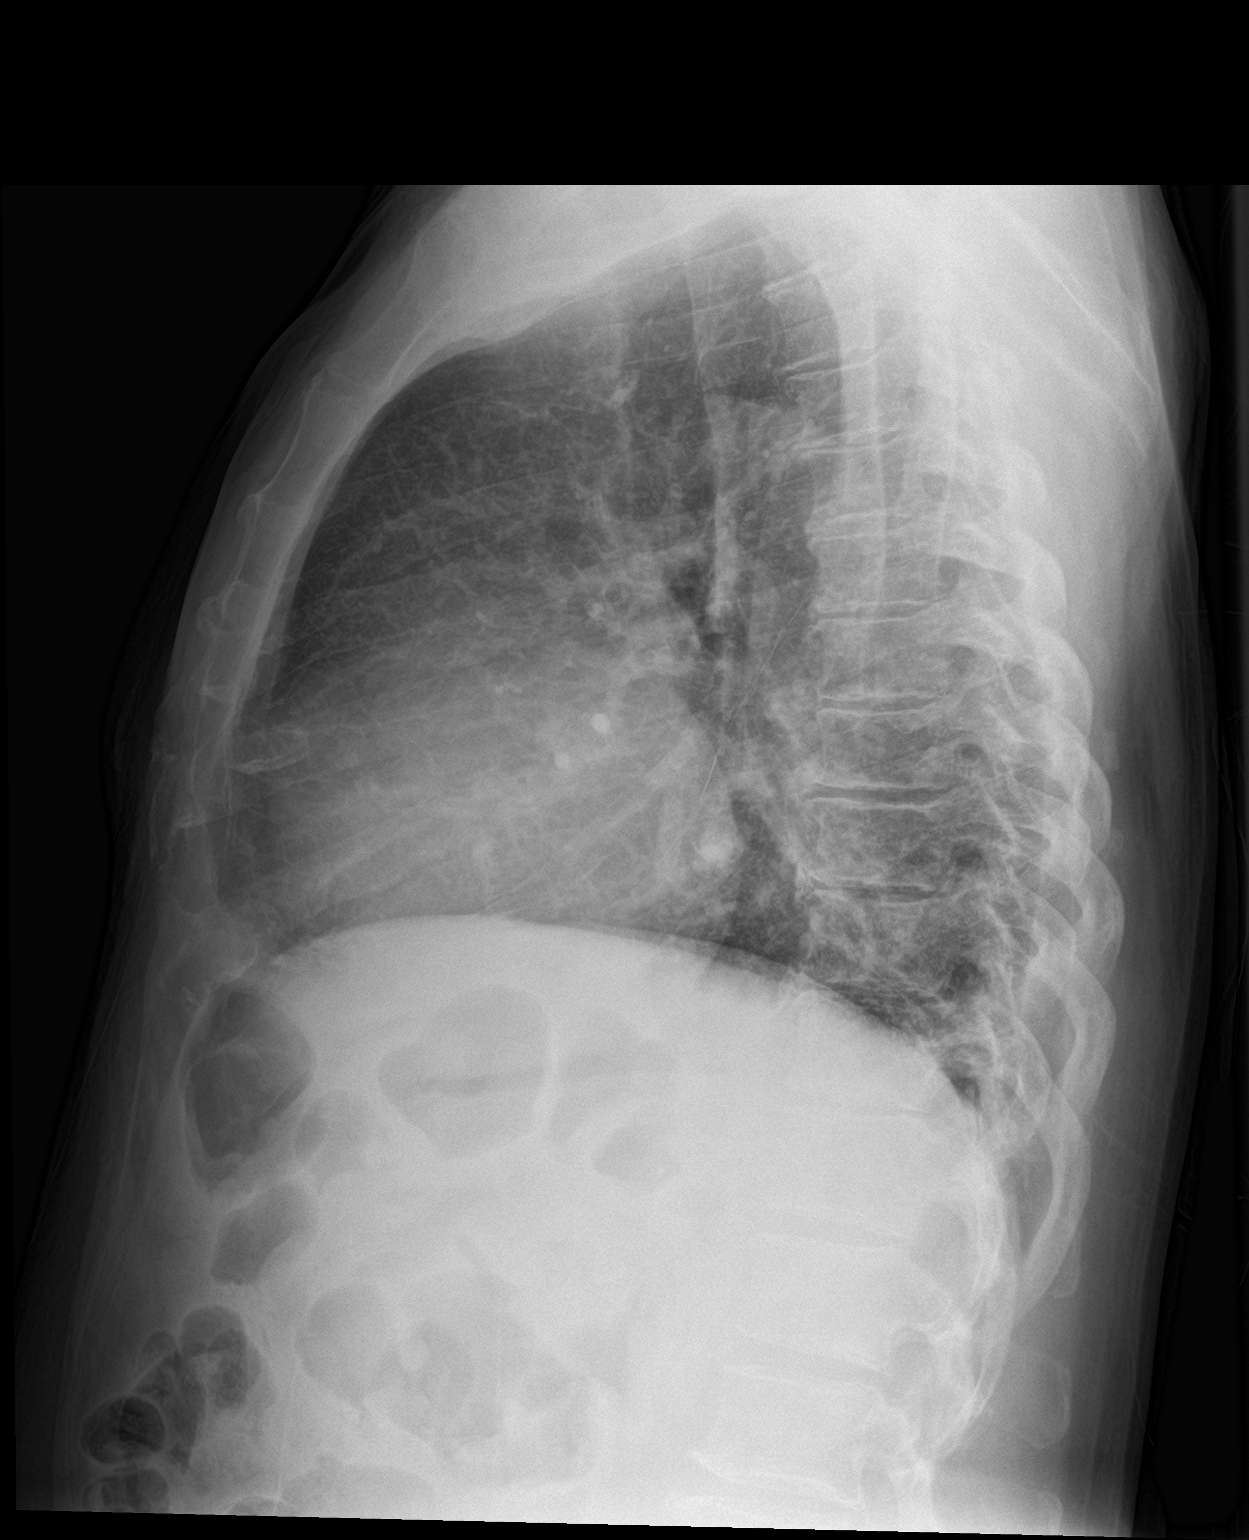

[2 of 2 positions shown; findings below may reference images not displayed]

FINDINGS: Mild vascular congestion. No focal consolidation, pleural effusion
or pneumothorax. Borderline cardiomegaly. Atherosclerotic
calcification of the aortic arch. No acute osseous pathology.
IMPRESSION: Mild vascular congestion. No focal consolidation.

## 2020-08-07 IMAGING — US US EXTREM LOW VENOUS
1 series · 14 of 24 positions shown · non-contrast
Comparison: None.

CLINICAL DATA: Bilateral leg swelling

EXAM:
BILATERAL LOWER EXTREMITY VENOUS DOPPLER ULTRASOUND
TECHNIQUE: Gray-scale sonography with compression, as well as color and duplex
ultrasound, were performed to evaluate the deep venous system(s)
from the level of the common femoral vein through the popliteal and
proximal calf veins.

[Series 1: us venous img lower bilat (dvt) · portal-venous · 14 of 70 slices shown]
[im 1/70]
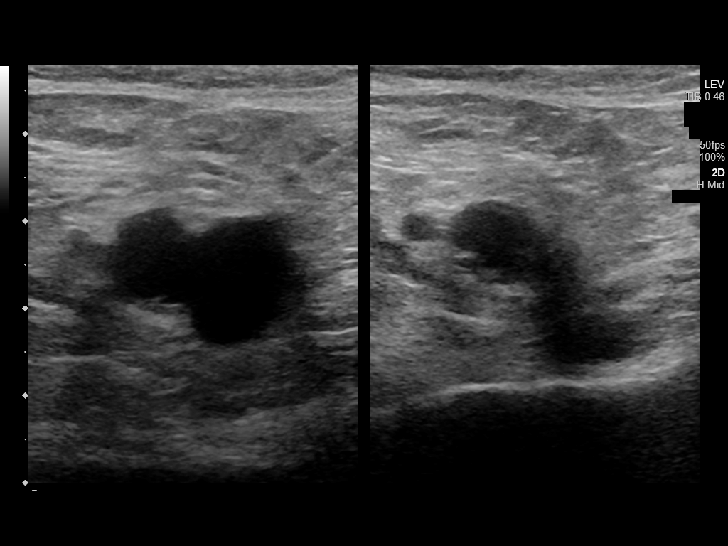
[im 7/70]
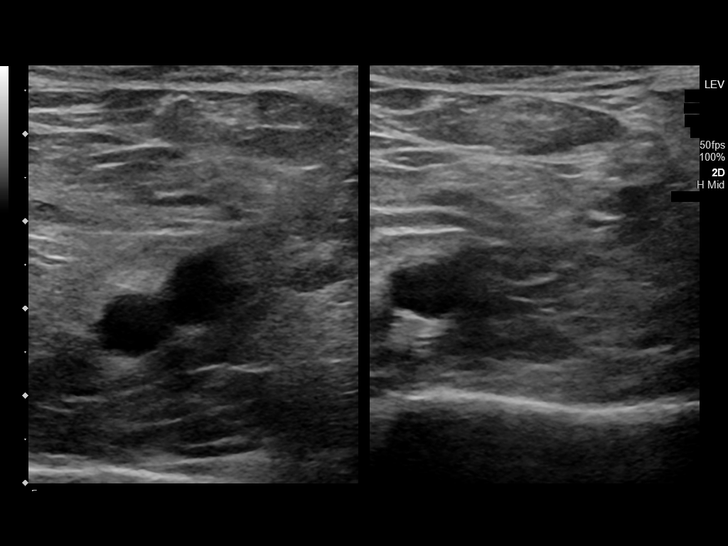
[im 13/70]
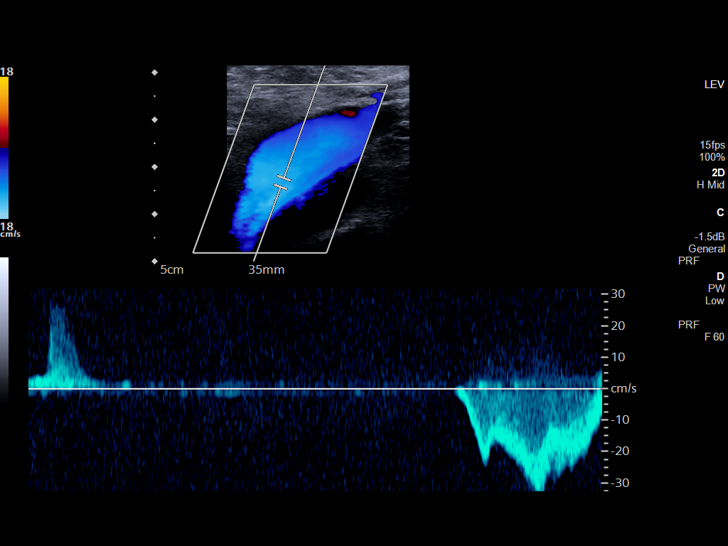
[im 19/70]
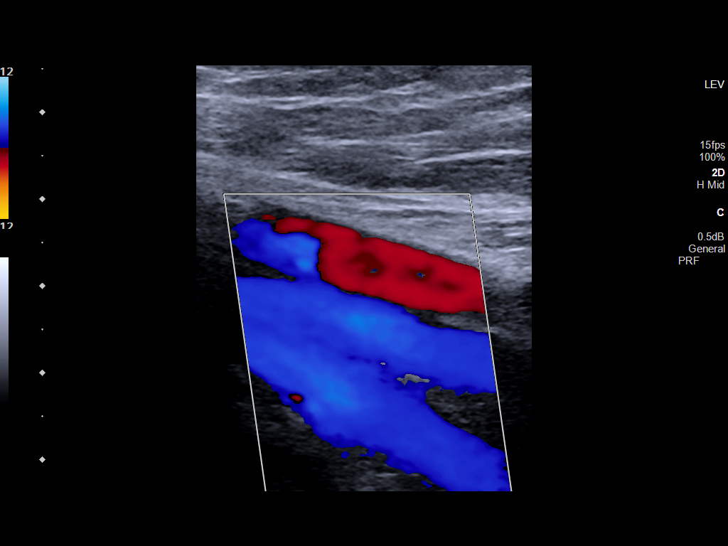
[im 22/70]
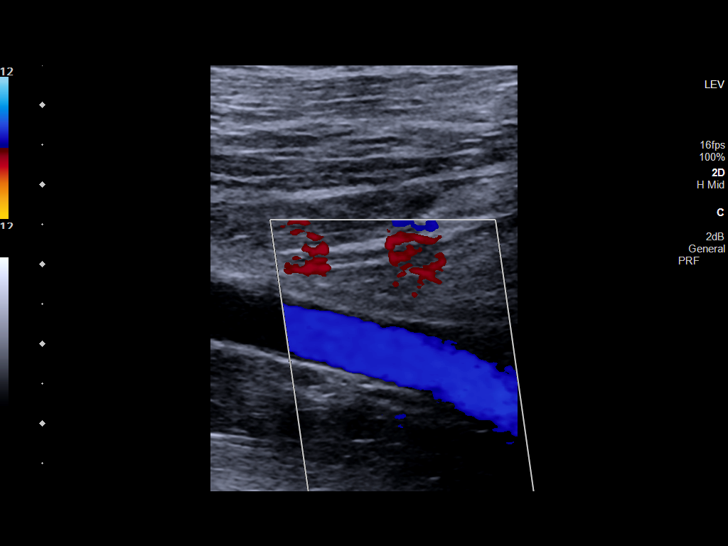
[im 28/70]
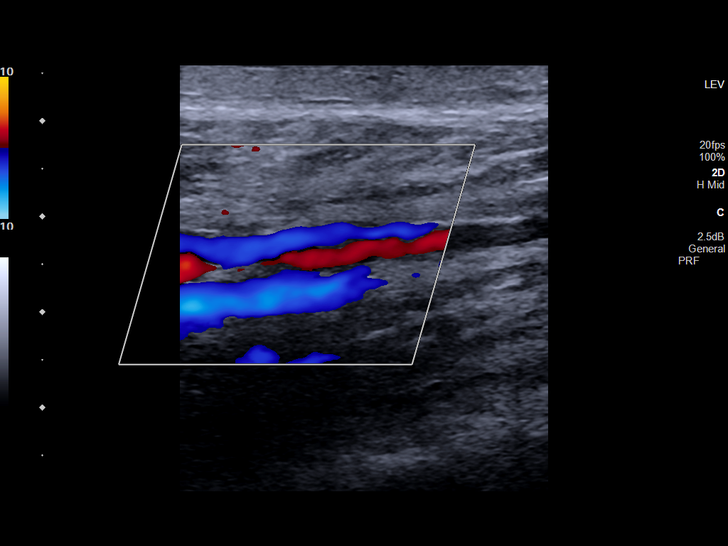
[im 34/70]
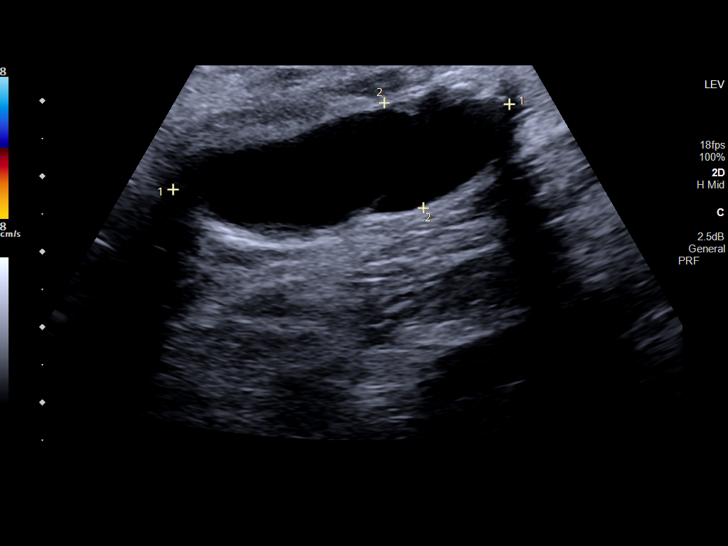
[im 37/70]
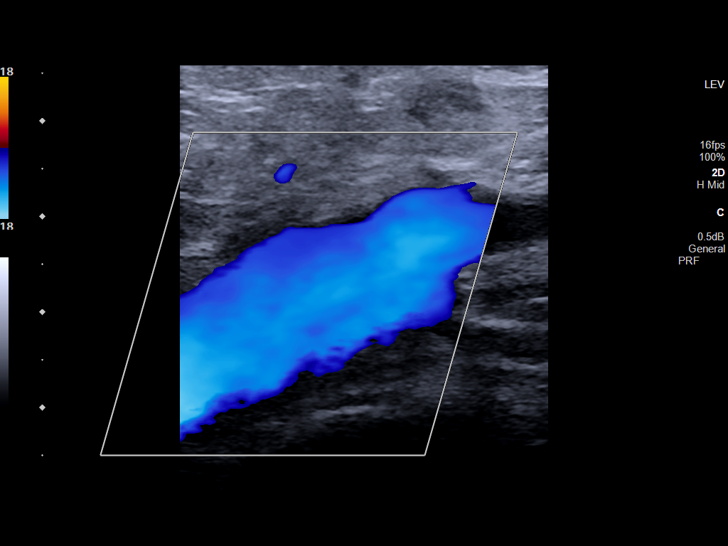
[im 43/70]
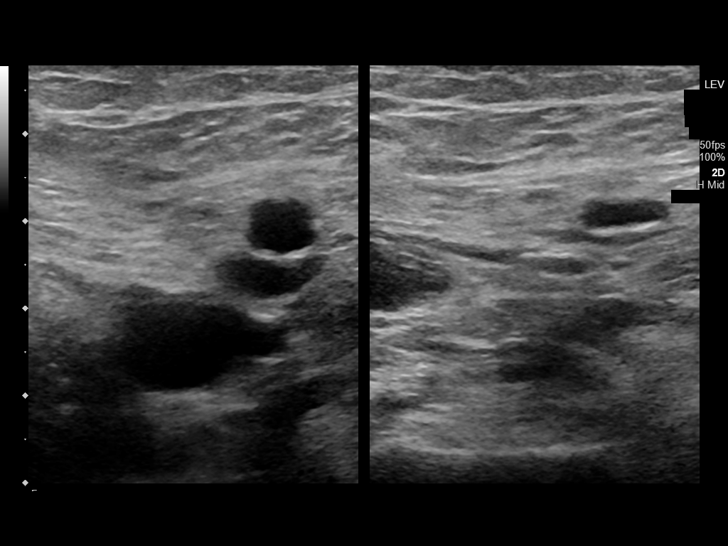
[im 49/70]
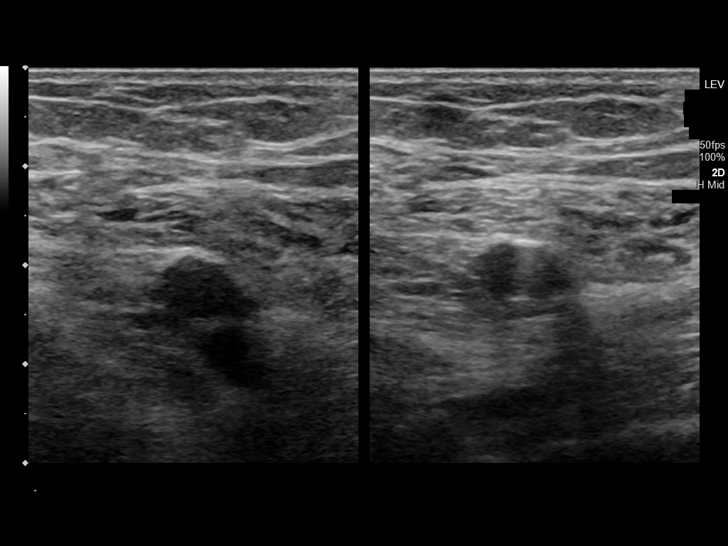
[im 55/70]
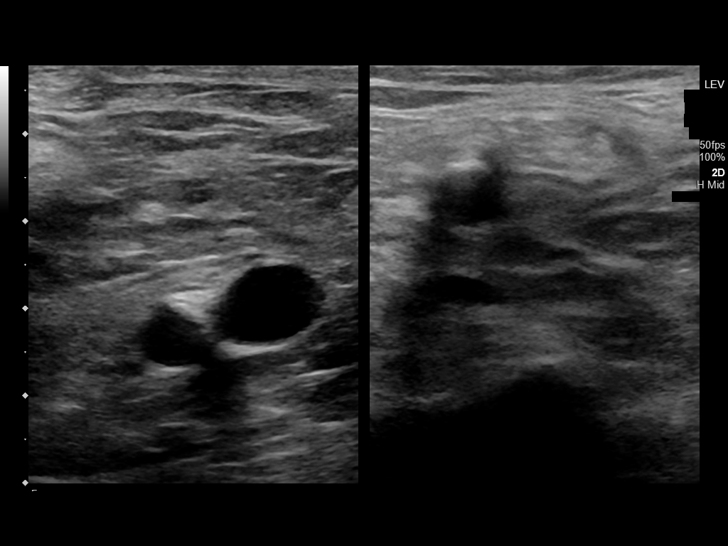
[im 58/70]
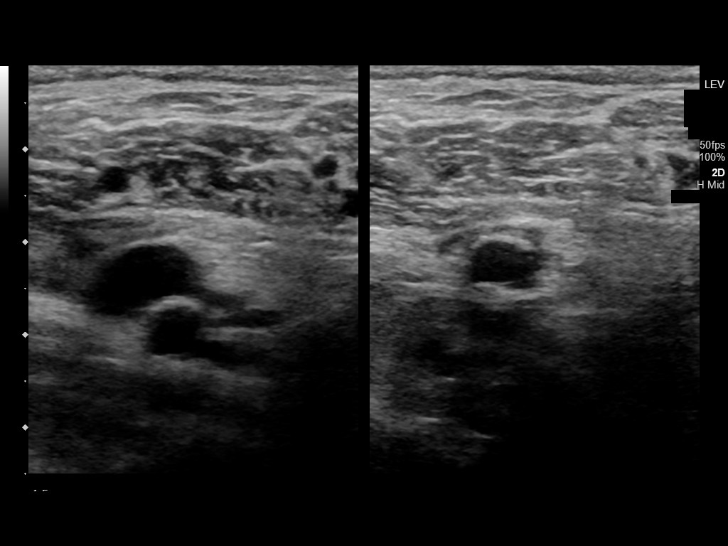
[im 64/70]
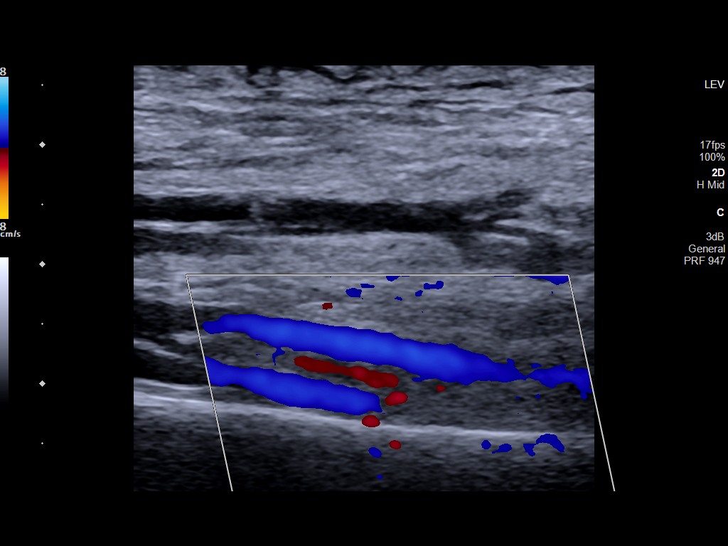
[im 70/70]
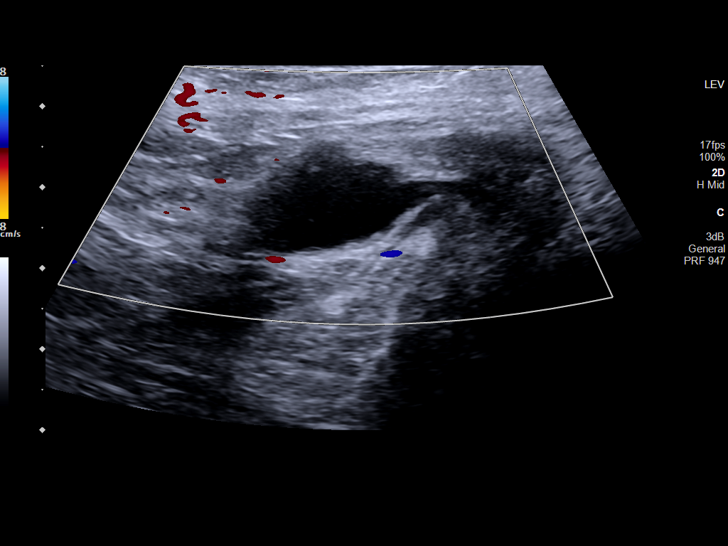

[14 of 24 positions shown; findings below may reference images not displayed]

FINDINGS: VENOUS

Compressible common femoral, superficial femoral, and popliteal
veins, as well as the visualized calf veins. Visualized portions of
profunda femoral vein and great saphenous vein unremarkable. No
filling defects to suggest DVT on grayscale or color Doppler
imaging. Doppler waveforms show normal respiratory plasticity and
response to augmentation.

Bilateral comma shaped avascular cystic structures are seen in the
popliteal fossas. The cysts measure up to 3.2 cm on the left and
cm on the right.

Subcutaneous edema seen at the right calf.

Atherosclerotic calcifications seen at the level of the femoral
arteries.
IMPRESSION: 1. Negative for DVT in either lower extremity.
2. Bilateral Baker's cyst.

## 2022-02-04 ENCOUNTER — Encounter (INDEPENDENT_AMBULATORY_CARE_PROVIDER_SITE_OTHER): Payer: Self-pay
